# Patient Record
Sex: Male | Born: 1935 | Race: Black or African American | Hispanic: No | Marital: Married | Smoking: Former smoker
Health system: Southern US, Community
[De-identification: ages and names within clinical notes are randomized; demographics above are authoritative.]

## PROBLEM LIST (undated history)

## (undated) DIAGNOSIS — R002 Palpitations: Secondary | ICD-10-CM

## (undated) DIAGNOSIS — I452 Bifascicular block: Secondary | ICD-10-CM

## (undated) DIAGNOSIS — F419 Anxiety disorder, unspecified: Secondary | ICD-10-CM

## (undated) DIAGNOSIS — I480 Paroxysmal atrial fibrillation: Secondary | ICD-10-CM

## (undated) DIAGNOSIS — C250 Malignant neoplasm of head of pancreas: Secondary | ICD-10-CM

## (undated) DIAGNOSIS — I2510 Atherosclerotic heart disease of native coronary artery without angina pectoris: Secondary | ICD-10-CM

## (undated) DIAGNOSIS — M549 Dorsalgia, unspecified: Secondary | ICD-10-CM

## (undated) DIAGNOSIS — Z8619 Personal history of other infectious and parasitic diseases: Secondary | ICD-10-CM

## (undated) DIAGNOSIS — I6529 Occlusion and stenosis of unspecified carotid artery: Secondary | ICD-10-CM

## (undated) DIAGNOSIS — N529 Male erectile dysfunction, unspecified: Secondary | ICD-10-CM

## (undated) DIAGNOSIS — I10 Essential (primary) hypertension: Secondary | ICD-10-CM

## (undated) DIAGNOSIS — Z9289 Personal history of other medical treatment: Secondary | ICD-10-CM

## (undated) DIAGNOSIS — E785 Hyperlipidemia, unspecified: Secondary | ICD-10-CM

## (undated) HISTORY — DX: Essential (primary) hypertension: I10

## (undated) HISTORY — DX: Male erectile dysfunction, unspecified: N52.9

## (undated) HISTORY — DX: Palpitations: R00.2

## (undated) HISTORY — PX: CORONARY ARTERY BYPASS GRAFT: SHX141

## (undated) HISTORY — DX: Hyperlipidemia, unspecified: E78.5

## (undated) HISTORY — DX: Personal history of other infectious and parasitic diseases: Z86.19

## (undated) HISTORY — DX: Atherosclerotic heart disease of native coronary artery without angina pectoris: I25.10

## (undated) HISTORY — DX: Bifascicular block: I45.2

## (undated) HISTORY — DX: Paroxysmal atrial fibrillation: I48.0

## (undated) HISTORY — DX: Personal history of other medical treatment: Z92.89

## (undated) HISTORY — DX: Anxiety disorder, unspecified: F41.9

## (undated) HISTORY — DX: Dorsalgia, unspecified: M54.9

## (undated) HISTORY — PX: CORONARY ANGIOPLASTY WITH STENT PLACEMENT: SHX49

## (undated) HISTORY — DX: Occlusion and stenosis of unspecified carotid artery: I65.29

## (undated) SURGERY — Surgical Case
Anesthesia: *Unknown

---

## 2000-11-20 ENCOUNTER — Emergency Department (HOSPITAL_COMMUNITY): Admission: EM | Admit: 2000-11-20 | Discharge: 2000-11-20 | Payer: Self-pay | Admitting: Emergency Medicine

## 2000-11-20 ENCOUNTER — Encounter: Payer: Self-pay | Admitting: Emergency Medicine

## 2000-12-15 ENCOUNTER — Encounter (INDEPENDENT_AMBULATORY_CARE_PROVIDER_SITE_OTHER): Payer: Self-pay | Admitting: *Deleted

## 2000-12-15 ENCOUNTER — Ambulatory Visit (HOSPITAL_BASED_OUTPATIENT_CLINIC_OR_DEPARTMENT_OTHER): Admission: RE | Admit: 2000-12-15 | Discharge: 2000-12-15 | Payer: Self-pay | Admitting: Surgery

## 2004-09-04 ENCOUNTER — Encounter: Admission: RE | Admit: 2004-09-04 | Discharge: 2004-09-04 | Payer: Self-pay | Admitting: Family Medicine

## 2004-11-18 ENCOUNTER — Encounter: Admission: RE | Admit: 2004-11-18 | Discharge: 2004-12-03 | Payer: Self-pay | Admitting: Neurosurgery

## 2005-05-16 ENCOUNTER — Encounter: Admission: RE | Admit: 2005-05-16 | Discharge: 2005-05-16 | Payer: Self-pay | Admitting: Family Medicine

## 2005-05-19 ENCOUNTER — Encounter: Admission: RE | Admit: 2005-05-19 | Discharge: 2005-05-19 | Payer: Self-pay | Admitting: Family Medicine

## 2005-05-23 ENCOUNTER — Emergency Department (HOSPITAL_COMMUNITY): Admission: EM | Admit: 2005-05-23 | Discharge: 2005-05-24 | Payer: Self-pay | Admitting: Emergency Medicine

## 2005-05-26 ENCOUNTER — Encounter: Admission: RE | Admit: 2005-05-26 | Discharge: 2005-05-26 | Payer: Self-pay | Admitting: Family Medicine

## 2005-05-27 ENCOUNTER — Inpatient Hospital Stay (HOSPITAL_COMMUNITY): Admission: AD | Admit: 2005-05-27 | Discharge: 2005-05-30 | Payer: Self-pay | Admitting: Internal Medicine

## 2008-04-28 ENCOUNTER — Encounter: Admission: RE | Admit: 2008-04-28 | Discharge: 2008-04-28 | Payer: Self-pay | Admitting: Family Medicine

## 2009-05-29 ENCOUNTER — Inpatient Hospital Stay (HOSPITAL_BASED_OUTPATIENT_CLINIC_OR_DEPARTMENT_OTHER): Admission: RE | Admit: 2009-05-29 | Discharge: 2009-05-29 | Payer: Self-pay | Admitting: Cardiovascular Disease

## 2009-06-03 ENCOUNTER — Inpatient Hospital Stay (HOSPITAL_COMMUNITY): Admission: RE | Admit: 2009-06-03 | Discharge: 2009-06-04 | Payer: Self-pay | Admitting: Cardiovascular Disease

## 2009-06-28 ENCOUNTER — Emergency Department (HOSPITAL_COMMUNITY): Admission: EM | Admit: 2009-06-28 | Discharge: 2009-06-28 | Payer: Self-pay | Admitting: Emergency Medicine

## 2009-10-12 ENCOUNTER — Ambulatory Visit: Payer: Self-pay | Admitting: Cardiothoracic Surgery

## 2009-10-12 ENCOUNTER — Inpatient Hospital Stay (HOSPITAL_COMMUNITY): Admission: RE | Admit: 2009-10-12 | Discharge: 2009-10-23 | Payer: Self-pay | Admitting: Cardiovascular Disease

## 2009-10-12 HISTORY — PX: CARDIAC CATHETERIZATION: SHX172

## 2009-10-13 ENCOUNTER — Encounter: Payer: Self-pay | Admitting: Cardiothoracic Surgery

## 2009-11-19 ENCOUNTER — Encounter: Admission: RE | Admit: 2009-11-19 | Discharge: 2009-11-19 | Payer: Self-pay | Admitting: Cardiothoracic Surgery

## 2009-11-19 ENCOUNTER — Encounter (HOSPITAL_COMMUNITY): Admission: RE | Admit: 2009-11-19 | Discharge: 2010-02-05 | Payer: Self-pay | Admitting: Cardiovascular Disease

## 2009-11-19 ENCOUNTER — Ambulatory Visit: Payer: Self-pay | Admitting: Cardiothoracic Surgery

## 2010-02-20 ENCOUNTER — Emergency Department (HOSPITAL_COMMUNITY): Admission: EM | Admit: 2010-02-20 | Discharge: 2010-02-20 | Payer: Self-pay | Admitting: Emergency Medicine

## 2010-05-07 ENCOUNTER — Ambulatory Visit: Payer: Self-pay | Admitting: Cardiology

## 2010-07-21 ENCOUNTER — Ambulatory Visit: Payer: Self-pay | Admitting: Cardiology

## 2010-08-02 ENCOUNTER — Ambulatory Visit: Payer: Self-pay | Admitting: Cardiovascular Disease

## 2010-09-03 ENCOUNTER — Ambulatory Visit: Payer: Self-pay | Admitting: Cardiovascular Disease

## 2010-09-19 ENCOUNTER — Encounter: Payer: Self-pay | Admitting: Cardiothoracic Surgery

## 2010-11-09 ENCOUNTER — Ambulatory Visit (INDEPENDENT_AMBULATORY_CARE_PROVIDER_SITE_OTHER): Payer: Medicare Other | Admitting: Cardiovascular Disease

## 2010-11-09 DIAGNOSIS — E7800 Pure hypercholesterolemia, unspecified: Secondary | ICD-10-CM

## 2010-11-09 DIAGNOSIS — Z951 Presence of aortocoronary bypass graft: Secondary | ICD-10-CM

## 2010-11-09 DIAGNOSIS — I2510 Atherosclerotic heart disease of native coronary artery without angina pectoris: Secondary | ICD-10-CM

## 2010-11-10 ENCOUNTER — Other Ambulatory Visit (INDEPENDENT_AMBULATORY_CARE_PROVIDER_SITE_OTHER): Payer: Medicare Other

## 2010-11-10 DIAGNOSIS — E789 Disorder of lipoprotein metabolism, unspecified: Secondary | ICD-10-CM

## 2010-11-14 LAB — TROPONIN I: Troponin I: 0.01 ng/mL (ref 0.00–0.06)

## 2010-11-14 LAB — URINALYSIS, ROUTINE W REFLEX MICROSCOPIC
Hgb urine dipstick: NEGATIVE
Ketones, ur: NEGATIVE mg/dL
Nitrite: NEGATIVE
Urobilinogen, UA: 1 mg/dL (ref 0.0–1.0)

## 2010-11-14 LAB — CBC
HCT: 37.3 % — ABNORMAL LOW (ref 39.0–52.0)
Hemoglobin: 12.4 g/dL — ABNORMAL LOW (ref 13.0–17.0)
MCH: 28.3 pg (ref 26.0–34.0)
MCV: 85.2 fL (ref 78.0–100.0)
RBC: 4.38 MIL/uL (ref 4.22–5.81)
WBC: 4.3 10*3/uL (ref 4.0–10.5)

## 2010-11-14 LAB — DIFFERENTIAL
Lymphocytes Relative: 29 % (ref 12–46)
Monocytes Absolute: 0.4 10*3/uL (ref 0.1–1.0)
Neutro Abs: 2.6 10*3/uL (ref 1.7–7.7)

## 2010-11-14 LAB — BASIC METABOLIC PANEL
Chloride: 108 mEq/L (ref 96–112)
Glucose, Bld: 111 mg/dL — ABNORMAL HIGH (ref 70–99)

## 2010-11-17 LAB — CBC
HCT: 24.9 % — ABNORMAL LOW (ref 39.0–52.0)
HCT: 24.9 % — ABNORMAL LOW (ref 39.0–52.0)
HCT: 26.8 % — ABNORMAL LOW (ref 39.0–52.0)
HCT: 29.4 % — ABNORMAL LOW (ref 39.0–52.0)
HCT: 32 % — ABNORMAL LOW (ref 39.0–52.0)
HCT: 34.3 % — ABNORMAL LOW (ref 39.0–52.0)
HCT: 34.6 % — ABNORMAL LOW (ref 39.0–52.0)
HCT: 35 % — ABNORMAL LOW (ref 39.0–52.0)
HCT: 37.7 % — ABNORMAL LOW (ref 39.0–52.0)
HCT: 40.3 % (ref 39.0–52.0)
HCT: 42.7 % (ref 39.0–52.0)
Hemoglobin: 11.1 g/dL — ABNORMAL LOW (ref 13.0–17.0)
Hemoglobin: 11.7 g/dL — ABNORMAL LOW (ref 13.0–17.0)
Hemoglobin: 11.9 g/dL — ABNORMAL LOW (ref 13.0–17.0)
Hemoglobin: 12 g/dL — ABNORMAL LOW (ref 13.0–17.0)
Hemoglobin: 12.9 g/dL — ABNORMAL LOW (ref 13.0–17.0)
Hemoglobin: 13.6 g/dL (ref 13.0–17.0)
Hemoglobin: 14.5 g/dL (ref 13.0–17.0)
Hemoglobin: 8.4 g/dL — ABNORMAL LOW (ref 13.0–17.0)
Hemoglobin: 8.5 g/dL — ABNORMAL LOW (ref 13.0–17.0)
Hemoglobin: 9.1 g/dL — ABNORMAL LOW (ref 13.0–17.0)
MCHC: 33.8 g/dL (ref 30.0–36.0)
MCHC: 34.1 g/dL (ref 30.0–36.0)
MCHC: 34.1 g/dL (ref 30.0–36.0)
MCHC: 34.2 g/dL (ref 30.0–36.0)
MCHC: 34.2 g/dL (ref 30.0–36.0)
MCHC: 34.2 g/dL (ref 30.0–36.0)
MCHC: 34.3 g/dL (ref 30.0–36.0)
MCHC: 34.3 g/dL (ref 30.0–36.0)
MCHC: 34.3 g/dL (ref 30.0–36.0)
MCHC: 34.7 g/dL (ref 30.0–36.0)
MCV: 89.4 fL (ref 78.0–100.0)
MCV: 89.5 fL (ref 78.0–100.0)
MCV: 89.9 fL (ref 78.0–100.0)
MCV: 90.5 fL (ref 78.0–100.0)
MCV: 90.7 fL (ref 78.0–100.0)
MCV: 90.9 fL (ref 78.0–100.0)
MCV: 91.5 fL (ref 78.0–100.0)
MCV: 91.5 fL (ref 78.0–100.0)
MCV: 91.6 fL (ref 78.0–100.0)
MCV: 91.9 fL (ref 78.0–100.0)
MCV: 92.1 fL (ref 78.0–100.0)
Platelets: 122 10*3/uL — ABNORMAL LOW (ref 150–400)
Platelets: 186 10*3/uL (ref 150–400)
Platelets: 199 10*3/uL (ref 150–400)
Platelets: 201 10*3/uL (ref 150–400)
Platelets: 252 10*3/uL (ref 150–400)
Platelets: 261 10*3/uL (ref 150–400)
Platelets: 277 10*3/uL (ref 150–400)
Platelets: 291 10*3/uL (ref 150–400)
Platelets: 309 10*3/uL (ref 150–400)
RBC: 2.64 MIL/uL — ABNORMAL LOW (ref 4.22–5.81)
RBC: 2.7 MIL/uL — ABNORMAL LOW (ref 4.22–5.81)
RBC: 2.71 MIL/uL — ABNORMAL LOW (ref 4.22–5.81)
RBC: 2.96 MIL/uL — ABNORMAL LOW (ref 4.22–5.81)
RBC: 3.58 MIL/uL — ABNORMAL LOW (ref 4.22–5.81)
RBC: 3.82 MIL/uL — ABNORMAL LOW (ref 4.22–5.81)
RBC: 3.84 MIL/uL — ABNORMAL LOW (ref 4.22–5.81)
RBC: 3.85 MIL/uL — ABNORMAL LOW (ref 4.22–5.81)
RBC: 3.86 MIL/uL — ABNORMAL LOW (ref 4.22–5.81)
RBC: 4.2 MIL/uL — ABNORMAL LOW (ref 4.22–5.81)
RBC: 4.26 MIL/uL (ref 4.22–5.81)
RBC: 4.65 MIL/uL (ref 4.22–5.81)
RDW: 13.4 % (ref 11.5–15.5)
RDW: 13.5 % (ref 11.5–15.5)
RDW: 13.5 % (ref 11.5–15.5)
RDW: 13.6 % (ref 11.5–15.5)
RDW: 13.9 % (ref 11.5–15.5)
RDW: 14.1 % (ref 11.5–15.5)
RDW: 14.5 % (ref 11.5–15.5)
RDW: 14.7 % (ref 11.5–15.5)
RDW: 14.7 % (ref 11.5–15.5)
RDW: 14.8 % (ref 11.5–15.5)
RDW: 14.9 % (ref 11.5–15.5)
RDW: 15.2 % (ref 11.5–15.5)
WBC: 10.4 10*3/uL (ref 4.0–10.5)
WBC: 10.7 10*3/uL — ABNORMAL HIGH (ref 4.0–10.5)
WBC: 11.5 10*3/uL — ABNORMAL HIGH (ref 4.0–10.5)
WBC: 11.7 10*3/uL — ABNORMAL HIGH (ref 4.0–10.5)
WBC: 12.6 10*3/uL — ABNORMAL HIGH (ref 4.0–10.5)
WBC: 13.9 10*3/uL — ABNORMAL HIGH (ref 4.0–10.5)
WBC: 5.6 10*3/uL (ref 4.0–10.5)
WBC: 5.9 10*3/uL (ref 4.0–10.5)
WBC: 8.3 10*3/uL (ref 4.0–10.5)
WBC: 9.9 10*3/uL (ref 4.0–10.5)

## 2010-11-17 LAB — POCT I-STAT, CHEM 8
Chloride: 108 mEq/L (ref 96–112)
Glucose, Bld: 129 mg/dL — ABNORMAL HIGH (ref 70–99)
HCT: 35 % — ABNORMAL LOW (ref 39.0–52.0)
Hemoglobin: 11.9 g/dL — ABNORMAL LOW (ref 13.0–17.0)
Potassium: 4.1 mEq/L (ref 3.5–5.1)
Sodium: 139 mEq/L (ref 135–145)

## 2010-11-17 LAB — POCT I-STAT 4, (NA,K, GLUC, HGB,HCT)
Glucose, Bld: 97 mg/dL (ref 70–99)
HCT: 20 % — ABNORMAL LOW (ref 39.0–52.0)
HCT: 22 % — ABNORMAL LOW (ref 39.0–52.0)
HCT: 33 % — ABNORMAL LOW (ref 39.0–52.0)
Hemoglobin: 5.8 g/dL — CL (ref 13.0–17.0)
Hemoglobin: 6.1 g/dL — CL (ref 13.0–17.0)
Hemoglobin: 7.5 g/dL — ABNORMAL LOW (ref 13.0–17.0)
Hemoglobin: 8.2 g/dL — ABNORMAL LOW (ref 13.0–17.0)
Sodium: 136 mEq/L (ref 135–145)
Sodium: 137 mEq/L (ref 135–145)
Sodium: 141 mEq/L (ref 135–145)
Sodium: 141 mEq/L (ref 135–145)

## 2010-11-17 LAB — TYPE AND SCREEN: Antibody Screen: NEGATIVE

## 2010-11-17 LAB — GLUCOSE, CAPILLARY
Glucose-Capillary: 104 mg/dL — ABNORMAL HIGH (ref 70–99)
Glucose-Capillary: 105 mg/dL — ABNORMAL HIGH (ref 70–99)
Glucose-Capillary: 114 mg/dL — ABNORMAL HIGH (ref 70–99)
Glucose-Capillary: 120 mg/dL — ABNORMAL HIGH (ref 70–99)
Glucose-Capillary: 135 mg/dL — ABNORMAL HIGH (ref 70–99)
Glucose-Capillary: 95 mg/dL (ref 70–99)
Glucose-Capillary: 97 mg/dL (ref 70–99)
Glucose-Capillary: 99 mg/dL (ref 70–99)

## 2010-11-17 LAB — BASIC METABOLIC PANEL
BUN: 13 mg/dL (ref 6–23)
BUN: 18 mg/dL (ref 6–23)
BUN: 9 mg/dL (ref 6–23)
CO2: 25 mEq/L (ref 19–32)
CO2: 27 mEq/L (ref 19–32)
CO2: 28 mEq/L (ref 19–32)
Calcium: 7.2 mg/dL — ABNORMAL LOW (ref 8.4–10.5)
Calcium: 7.5 mg/dL — ABNORMAL LOW (ref 8.4–10.5)
Calcium: 8.6 mg/dL (ref 8.4–10.5)
Chloride: 106 mEq/L (ref 96–112)
Chloride: 108 mEq/L (ref 96–112)
Creatinine, Ser: 0.92 mg/dL (ref 0.4–1.5)
Creatinine, Ser: 1 mg/dL (ref 0.4–1.5)
GFR calc Af Amer: >60 mL/min (ref >60)
GFR calc Af Amer: >60 mL/min (ref >60)
GFR calc Af Amer: >60 mL/min (ref >60)
GFR calc Af Amer: >60 mL/min (ref >60)
GFR calc non Af Amer: >60 mL/min (ref >60)
GFR calc non Af Amer: >60 mL/min (ref >60)
GFR calc non Af Amer: >60 mL/min (ref >60)
GFR calc non Af Amer: >60 mL/min (ref >60)
Glucose, Bld: 104 mg/dL — ABNORMAL HIGH (ref 70–99)
Glucose, Bld: 117 mg/dL — ABNORMAL HIGH (ref 70–99)
Glucose, Bld: 122 mg/dL — ABNORMAL HIGH (ref 70–99)
Potassium: 3.5 mEq/L (ref 3.5–5.1)
Potassium: 3.8 mEq/L (ref 3.5–5.1)
Potassium: 4 mEq/L (ref 3.5–5.1)
Sodium: 137 mEq/L (ref 135–145)
Sodium: 139 mEq/L (ref 135–145)
Sodium: 139 mEq/L (ref 135–145)
Sodium: 140 mEq/L (ref 135–145)

## 2010-11-17 LAB — CARDIAC PANEL(CRET KIN+CKTOT+MB+TROPI)
CK, MB: 0.6 ng/mL (ref 0.3–4.0)
Relative Index: INVALID (ref 0.0–2.5)
Total CK: 22 U/L (ref 7–232)

## 2010-11-17 LAB — POCT I-STAT 3, ART BLOOD GAS (G3+)
Acid-Base Excess: 1 mmol/L (ref 0.0–2.0)
Acid-base deficit: 2 mmol/L (ref 0.0–2.0)
Acid-base deficit: 3 mmol/L — ABNORMAL HIGH (ref 0.0–2.0)
Bicarbonate: 23.3 mEq/L (ref 20.0–24.0)
Bicarbonate: 26.7 mEq/L — ABNORMAL HIGH (ref 20.0–24.0)
O2 Saturation: 100 %
O2 Saturation: 100 %
O2 Saturation: 98 %
O2 Saturation: 99 %
TCO2: 25 mmol/L (ref 0–100)
pCO2 arterial: 28.1 mmHg — ABNORMAL LOW (ref 35.0–45.0)
pCO2 arterial: 46.2 mmHg — ABNORMAL HIGH (ref 35.0–45.0)
pO2, Arterial: 123 mmHg — ABNORMAL HIGH (ref 80.0–100.0)
pO2, Arterial: 268 mmHg — ABNORMAL HIGH (ref 80.0–100.0)

## 2010-11-17 LAB — POCT I-STAT 3, VENOUS BLOOD GAS (G3P V)
Acid-base deficit: 2 mmol/L (ref 0.0–2.0)
Bicarbonate: 23.6 mEq/L (ref 20.0–24.0)
O2 Saturation: 78 %
TCO2: 25 mmol/L (ref 0–100)
pO2, Ven: 29 mmHg — CL (ref 30.0–45.0)

## 2010-11-17 LAB — PLATELET COUNT: Platelets: 153 10*3/uL (ref 150–400)

## 2010-11-17 LAB — CREATININE, SERUM
Creatinine, Ser: 0.95 mg/dL (ref 0.4–1.5)
GFR calc Af Amer: >60 mL/min (ref >60)
GFR calc non Af Amer: >60 mL/min (ref >60)

## 2010-11-17 LAB — MRSA PCR SCREENING
MRSA by PCR: NEGATIVE
MRSA by PCR: NEGATIVE

## 2010-11-17 LAB — BLOOD GAS, ARTERIAL
Bicarbonate: 23.1 mEq/L (ref 20.0–24.0)
FIO2: 0.21 %
Patient temperature: 98.6
pH, Arterial: 7.477 — ABNORMAL HIGH (ref 7.350–7.450)

## 2010-11-17 LAB — COMPREHENSIVE METABOLIC PANEL
ALT: 10 U/L (ref 0–53)
AST: 15 U/L (ref 0–37)
Albumin: 2.8 g/dL — ABNORMAL LOW (ref 3.5–5.2)
Alkaline Phosphatase: 27 U/L — ABNORMAL LOW (ref 39–117)
Chloride: 109 mEq/L (ref 96–112)
GFR calc non Af Amer: >60 mL/min (ref >60)
Total Bilirubin: 0.3 mg/dL (ref 0.3–1.2)

## 2010-11-17 LAB — MAGNESIUM
Magnesium: 2.4 mg/dL (ref 1.5–2.5)
Magnesium: 2.8 mg/dL — ABNORMAL HIGH (ref 1.5–2.5)

## 2010-11-17 LAB — PREPARE RBC (CROSSMATCH)

## 2010-11-17 LAB — POCT I-STAT GLUCOSE
Glucose, Bld: 105 mg/dL — ABNORMAL HIGH (ref 70–99)
Operator id: 3390

## 2010-11-17 LAB — TSH: TSH: 0.269 u[IU]/mL — ABNORMAL LOW (ref 0.350–4.500)

## 2010-11-17 LAB — PROTIME-INR
INR: 1.21 (ref 0.00–1.49)
INR: 1.64 — ABNORMAL HIGH (ref 0.00–1.49)
Prothrombin Time: 19.3 seconds — ABNORMAL HIGH (ref 11.6–15.2)

## 2010-11-17 LAB — HEMOGLOBIN A1C
Hgb A1c MFr Bld: 5.5 % (ref 4.6–6.1)
Mean Plasma Glucose: 111 mg/dL

## 2010-11-17 LAB — HEMOGLOBIN AND HEMATOCRIT, BLOOD: Hemoglobin: 6.8 g/dL — CL (ref 13.0–17.0)

## 2010-11-17 LAB — APTT: aPTT: 32 seconds (ref 24–37)

## 2010-11-18 ENCOUNTER — Telehealth: Payer: Self-pay | Admitting: *Deleted

## 2010-11-18 NOTE — Telephone Encounter (Signed)
Pt called questioning whether he should be on Tricor daily along with his Crestor.  Per Dr. Elease Hashimoto:  No Tricor is needed, continue the Crestor 10mg  daily.  Pt was advised and verbalized an understanding of this.

## 2010-12-02 LAB — BASIC METABOLIC PANEL
BUN: 12 mg/dL (ref 6–23)
CO2: 27 mEq/L (ref 19–32)
Chloride: 106 mEq/L (ref 96–112)
Creatinine, Ser: 0.93 mg/dL (ref 0.4–1.5)
Glucose, Bld: 108 mg/dL — ABNORMAL HIGH (ref 70–99)
Potassium: 3.4 mEq/L — ABNORMAL LOW (ref 3.5–5.1)

## 2010-12-02 LAB — POCT I-STAT, CHEM 8
Calcium, Ion: 1.24 mmol/L (ref 1.12–1.32)
Chloride: 106 mEq/L (ref 96–112)
Creatinine, Ser: 1.1 mg/dL (ref 0.4–1.5)
Glucose, Bld: 97 mg/dL (ref 70–99)
Potassium: 4.1 mEq/L (ref 3.5–5.1)

## 2010-12-02 LAB — CBC
HCT: 39.9 % (ref 39.0–52.0)
MCHC: 34 g/dL (ref 30.0–36.0)
MCV: 93.2 fL (ref 78.0–100.0)
Platelets: 222 10*3/uL (ref 150–400)
RDW: 13.9 % (ref 11.5–15.5)

## 2010-12-23 ENCOUNTER — Other Ambulatory Visit: Payer: Self-pay | Admitting: Cardiovascular Disease

## 2010-12-23 NOTE — Telephone Encounter (Signed)
Pt called, samples of crestor 10mg  at desk [phone] 01/15 30daysJodette Jozee Hammer RN

## 2010-12-23 NOTE — Telephone Encounter (Signed)
Wanted to know if he would be able to get some samples of Crestor from the office. Please call back. I have pulled the chart.

## 2011-01-11 NOTE — Assessment & Plan Note (Signed)
OFFICE VISIT   Alexander Farley, Alexander Farley  DOB:  23-May-1933                                        November 19, 2009  CHART #:  [id number]   HISTORY OF PRESENT ILLNESS:  The patient is status post coronary artery  bypass grafting x2 done by Dr. Tyrone Sage, October 19, 2009.  Preoperatively, the patient did have anemia with guaiac-positive stool  with actively bleeding duodenal arterial venous malformation.  He had  undergone an EGD preoperatively with controlled hemorrhage October 17, 2009.  He proceeded to undergo coronary artery bypass grafting on the  21st and did well postoperatively.  He had no further signs of any GI  bleeding postoperatively.  His hemoglobin and hematocrit remained  stable.  The patient was discharged to home in stable condition.  He  presents today for his 3-week followup visit.  The patient has seen Dr.  Elease Hashimoto since discharge, who felt that the patient was doing well.  He  plans to continue all current medications except he decrease the  amiodarone to 200 mg daily and then the patient is to stop taking once  runs out of his current prescription.  The patient also has gone to  outpatient cardiac rehab appointment.  He went this morning and has  scheduled to continue with outpatient cardiac rehab.  He is currently  ambulating about 1.5 miles daily at home.  He denies any shortness of  breath, chest pain, nausea, vomiting, lightheadedness, or dizziness with  this ambulation.  The patient does complain an episode yesterday of  chest pain following a phone conversation regarding personal issues.  He  states that he developed some numbness in his hands and took a  nitroglycerin at this time.  Following taking the nitroglycerin, the  chest pain did really resolve.  He states that lasted less in 5 minutes.  Other than this episode, the patient has had no other episodes of chest  pain.  He is tolerating diet well.  No nausea or vomiting.  Denies any  fevers, opening or drainage from any of his incision sites.  He does  note that he is not sleeping throughout the night.  When he wakes up, he  just finds a difficult to fall back asleep.   PHYSICAL EXAMINATION:  Vital Signs:  Blood pressure 178/102, pulse of  65, respirations of 18, and O2 saturations 97% on room air.  Respiratory:  Clear to auscultation bilaterally.  Cardiac:  Regular rate  and rhythm.  No murmurs noted.  Noted positive click in the sternum with  cough.  Abdomen:  Bowel sounds x4.  Soft and nontender.  Extremities:  No edema noted.  Incisions:  All incisions are clean, dry, and intact  and healing well.  One suture protruding through his right EVH site was  cut.   STUDIES:  The patient had a PA and lateral chest x-ray done today shows  some minimal bilateral pleural effusions.  There is improved aeration  with stable postoperative changes.  All sternal wires noted to be  intact.   IMPRESSION AND PLAN:  The patient was seen and evaluated by Dr.  Tyrone Sage.  The patient is noted to be progressing quite well.  Blood  pressure noted to be elevated.  On questioning, the patient had not  taken his medications yet.  He states he  forgot.  Plan is to continue  with amiodarone per Dr. Elease Hashimoto as well as Toprol-XL.  I did give him  prescription for lisinopril 2.5 mg to be taken daily.  Cardiac rehab  will be following the patient and checking his blood pressure.  The  patient is to continue all other medications and is recommended he take  the oxycodone at night to assist with his sleeping habits.  He is to  continue ambulating 3 or 4 times per day and using his incentive  spirometer.  It is okay for him to start driving at this point.  He is  instructed still no heavy lifting for another 2 months.  At this point,  Dr. Tyrone Sage feels the patient is progressing quite well and can be  released from the office.  He will plan to continue following up with  Dr. Elease Hashimoto.  The patient  is instructed if he has any surgical issues, he  is to contact us.  The patient is in agreement.   Sheliah Plane, MD  Electronically Signed   KMD/MEDQ  D:  11/19/2009  T:  11/20/2009  Job:  [id number]   cc:   Vesta Mixer, M.D.

## 2011-01-14 NOTE — Discharge Summary (Signed)
NAMEJAMORI, Alexander Farley                 ACCOUNT NO.:  [id number]   MEDICAL RECORD NO.:  [id number]          PATIENT TYPE:  INP   LOCATION:  5501                         FACILITY:  [facility]   PHYSICIAN:  [name], MDDATE OF BIRTH:  1933-01-07   DATE OF ADMISSION:  05/27/2005  DATE OF DISCHARGE:  05/30/2005                                 DISCHARGE SUMMARY   DIAGNOSES:  1.  Hypotension secondary to dehydration and medications.  2.  Dehydration.  3.  Acute renal insufficiency secondary to prerenal azotemia.  4.  Digoxin toxicity.  5.  Fatigue, weakness, failure to thrive most likely secondary to Zocor.  6.  Lumbar spinal stenosis.  7.  History of hypertension.  8.  History of atrial fibrillation, maintained in sinus rhythm for many      years according to Dr. Manus Gunning.  9.  Anxiety disorder.  10. Hyperlipidemia.  11. Benign prostatic hypertrophy  12. Carpal tunnel syndrome.   DISCHARGE MEDICATIONS:  1.  He is to hold Diovan, Zocor, triamterene/hydrochlorothiazide, and      diclofenac.  2.  He is to resume aspirin 325 milligrams a day.  3.  Flomax 0.4 milligrams a day.  4.  Lorazepam 1 milligram p.o. q.h.s.  5.  Tiazac has been decreased to 120 milligrams a day and Lanoxin down to      0.125 milligrams a day.   FOLLOWUP:  He is to follow up with Dr. Manus Gunning within 1-2 weeks. He is to  follow up with Dr. Phoebe Perch for his spinal stenosis which is presumably a new  diagnosis for him.   CONDITION:  Stable.   ACTIVITY:  Ad lib.   DIET:  Diet should be low cholesterol.   CONSULTATIONS:  None.   PROCEDURES:  None.   PERTINENT LABORATORY DATA:  CBC on admission was unremarkable. Hemoccult of  the stool was negative. Erythrocyte sedimentation rate was 28. Complete  metabolic panel on admission was significant for a BUN of 35, a creatinine  of 2.4, albumin 3.0, AST 48, ALT 86, otherwise unremarkable. At discharge,  his BUN was 14 and creatinine 1.1. Digoxin level on admission  was 2.2, at  discharge was 0.8, total CPK was 24.   SPECIAL STUDIES IN RADIOLOGY:  EKG showed normal sinus rhythm and a left  anterior fascicular block. No significant change from previous. MRI of the  lumbar spine showed multifactorial spinal stenosis at L4-5 where 7 mm of  degenerative slip, central bulging, annular fibers, and marked posterior  element hypertrophy contribute to cause compression of not only the thecal  sac but bilateral L4 and bilateral L5 nerve root compression with left  paracentral protrusion at T12-L1 with suspected left L1 nerve root  encroachment.   HISTORY AND HOSPITAL COURSE:  Alexander Farley is a delightful 77 year old black  male patient of Dr. Manus Gunning who was directly admitted to the telemetry unit  with orthostatic hypotension and digoxin toxicity as well as he acute renal  insufficiency. He has a fairly complex history over the past month or so. He  has been feeling weak with poor appetite,  subjective fevers, fatigue, as  well as worsening lower back pain that radiates to both flanks. He has had  several trips to the emergency room and has had multiple tests as an  outpatient. Please see H&P for complete details. On the day of admission, he  was found to have orthostatic hypotension with a blood pressure 72/40  standing. This was taken in Dr. Randel Books office. The patient's medications  were held. In the office, he was also found to have a digoxin level of 3.5.  He had had normal CPK, normal erythrocyte sedimentation rate, and normal TSH  as an outpatient. His baseline creatinine was about 1, and his creatinine in  the office was 2.7. The patient was given IV fluids his medications were  held. He had no ectopy whatsoever on telemetry throughout his  hospitalization. His blood pressure improved and he no longer had  orthostatic hypotension on the day of discharge. He was feeling much better  and eating 100% of his a meals. He had no fevers during his  hospitalization.  His digoxin level came down to within therapeutic range. Also, his  creatinine improved to baseline. Due to the worsening back pain over the  past two weeks, an MRI was ordered and L-spine had been done as an  outpatient. Please see H&P for details. The MRI did show spinal stenosis. He  has seen Dr. Phoebe Perch in the past for what sounds like degenerative disk  disease and osteoarthritis of the C-spine. Due to his acute renal  insufficiency, I have asked that he hold the diclofenac and instead take  Tylenol. He may resume his aspirin a day. However, due to the low blood  pressure, I have asked that he continue to hold his Diovan, triamterene, and  hydrochlorothiazide also due to the renal insufficiency. On the day of  discharge, his blood pressure was about 120 systolic. He may be able to  resume these antihypertensives as an outpatient but I have would do so  carefully. As his blood pressure is still low normal, I will not give Tiazac  420 milligrams a day but have decreased the dose and given him a two week  supply. He may need to increase back to his maintenance dose as an  outpatient. I have discussed the case with Dr. Manus Gunning who agrees with  management and plans on re-evaluating his blood pressure and symptoms to see  whether any of his outpatient medications will be resumed or whether they  will be changed. I have asked that the patient follow up with both Dr.  Phoebe Perch and Dr. Manus Gunning. Total time on the day of discharge is 45 minutes.      [name], MD  Electronically Signed     CLS/MEDQ  D:  05/30/2005  T:  05/30/2005  Job:  [id number]   cc:   Clydene Fake, M.D.  Fax: [phone]   Bryan Lemma. Manus Gunning, M.D.  Fax: [phone]

## 2011-01-14 NOTE — H&P (Signed)
Alexander Farley, Alexander Farley                 ACCOUNT NO.:  [id number]   MEDICAL RECORD NO.:  [id number]          PATIENT TYPE:  INP   LOCATION:  5501                         FACILITY:  [facility]   PHYSICIAN:  [name], MDDATE OF BIRTH:  10/04/1932   DATE OF ADMISSION:  05/27/2005  DATE OF DISCHARGE:                                HISTORY & PHYSICAL   CHIEF COMPLAINT:  Weakness and fever.   HISTORY OF PRESENT ILLNESS:  Alexander Farley is a 77 year old black male patient  of Dr. Manus Gunning who was directly admitted to the telemetry unit with  orthostatic hypotension in the office. His blood pressure went from 98/60 to  lying to 72/40 standing. He reports being weak for the past several weeks.  He thinks it may have started after he was started on Zocor. His appetite  has been poor. He has not eaten or drank much over the past week. He has  lost weight, about 10 pounds recently. His wife reports that he felt hot and  apparently was delirious several times over the past week. She never took  his temperature. He also fell last night when he stood up.  He denies cough,  dysuria, shaking chills, or nightsweats. He denies any rash. He denies that  his back has been hurting a lot worse over the past two weeks as well and he  has been taking a lot of diclofenac. He had labs drawn in the office and was  found to have an elevated creatinine and digoxin level. He has had on  shortness of breath, chest pain, nausea, vomiting, or diarrhea.   PAST MEDICAL HISTORY:  1.  History of atrial fibrillation.  2.  Anxiety.  3.  Hyperlipidemia.  4.  Erectile dysfunction.   MEDICATIONS:  1.  Lanoxin 0.25 mg daily.  2.  Aspirin 325 mg daily.  3.  Ativan 1 mg p.o. daily.  4.  Maxzide 75 mg daily.  5.  Tiazac 428 mg daily.  6.  Multivitamin a day.  7.  Diovan 160 mg daily.  8.  Zocor 40 mg  a day was started in August.  9.  Viagra as needed.  10. Diclofenac b.i.d.   ALLERGIES:  No known drug allergies.   SOCIAL HISTORY:  He is married. He does not drink or smoke.   FAMILY HISTORY:  His mother and father both died in their 10s of stroke.   REVIEW OF SYSTEMS:  As above. Otherwise, negative.   PHYSICAL EXAMINATION:  VITAL SIGNS:  His temperature here is 98.2, pulse 82,  respiratory rate 20, blood pressure 84/52 and upon recheck five minutes  later was 94/51, oxygen saturation 97% on room air. He weighed 69 kg.  GENERAL: The patient is a thin appearing, weak appearing black male.  HEENT: Normocephalic and atraumatic. Pupils equal, round, and reactive to  light. He has muddy sclerae. Moist mucous membranes.  NECK: Supple with no carotid bruits, no thyromegaly, no lymphadenopathy.  LUNGS: Clear to auscultation bilaterally without rales, rhonchi, or wheezes.  CARDIOVASCULAR: Regular rate and rhythm without murmurs, rubs, or gallops.  ABDOMEN:  Normal bowel sounds, soft, nontender, and nondistended.  GU/RECTAL EXAM: Deferred.  EXTREMITIES: No clubbing, cyanosis, or edema. Pulses are intact.  SKIN: No rash.  PSYCHIATRIC: The patient has a very flat affect and quiet voice, but is calm  and cooperative.  NEUROLOGIC: The patient is alert and oriented. Cranial nerve and  sensorimotor exam are intact.   LABORATORY DATA:  Labs drawn today in the office his digoxin level was 3.5,  CPK 18, glucose 154, BUN 33, creatinine 2.7. White blood cell count 8,  hemoglobin 12.9, hematocrit 37.3, platelet count 261,000. Sodium 137,  potassium 4.0, chloride 106, bicarbonate 25, calcium 9. His creatinine drawn  on May 10, 2005, was 1.2 and his liver function tests were normal at  that time. His TSH on May 10, 2005, was 1.04. Erythrocyte sediment  rate on that day was 10.  He went to the emergency room on May 23, 2005, and had labs drawn. The CBC was within normal limits. His BUN was 26  and his creatinine was 1.6 at that time. He had a UA that was negative for  nitrites, leukocyte esterase, and  blood at that time. Strep screen was  negative. He had an ultrasound of the abdomen on May 16, 2005, which  showed no gallstones and a soft tissue mass, suggest CT of the pancreas;  complex right renal cyst. CT of the abdomen with and without contrast showed  lobulation at the anterior pancreatic neck without any intrinsic pancreatic  lesion seen. Multiple liver cysts. Benign-appearing right renal cyst.  Incidental small hiatal hernia. Degenerative changes in the lumbar spine  with slight scoliosis and L5-S1 anterolisthesis. Chest x-ray done on the  25th showed nothing acute. L-spine films on the 28th showed grade I  anterolisthesis of L4 on L5 and degenerative disease of the lumbar spine  without acute abnormalities. EKG showed normal sinus rhythm, left anterior  fascicular block.   ASSESSMENT/PLAN:  1.  Digoxin toxicity. Patient will be monitored on telemetry. He will get IV      fluids and supportive care. His digoxin, of course, will be held. At      this time he does not need Digibind.  2.  Acute renal insufficiency, multifactorial, certainly related to      dehydration, Diovan, and Diclofenac. He will get IV fluids and have      these medications held.  3.  Hypotension secondary to dehydration and medication effect. All of his      antihypertensives and heart medications will be held.  4.  History of weakness. I question whether this may be related to the      Statin and I will stop this and see if his fatigue improves after      resolution of his dehydration, etc.  5.  Weight loss. Maybe due to digoxin toxicity. I will, however, get      Hemoccults of the stool and give empiric  proton pump inhibitor. He      reports that he has never had a colonoscopy.  6.  Worsening back pain: He describes the pain as a pain that goes across      his back. He has no point tenderness on back examination. I will get an     MRI. He describes no bowel or bladder incontinence.  7.  History of  hypertension.  8.  History of atrial fibrillation.  9.  Anxiety disorder.  10. Hyperlipidemia.  11. Benign prostatic hypertrophy, recently started on Flomax.  12. Reported fever. Blood cultures were drawn in the emergency room on the      25th and are negative to date as have been all the other workups. For      now this will just be monitored and I will check an erythrocyte      sedimentation rate.      [name], MD  Electronically Signed     CLS/MEDQ  D:  05/27/2005  T:  05/27/2005  Job:  [id number]

## 2011-01-14 NOTE — Op Note (Signed)
[place]  Patient:    XAVIEN, DAUPHINAIS                        MRN: [id number] Proc. Date: 12/15/00 Adm. Date:  [id number] Attending:  Charlton Haws CC:         Dyanne Carrel, M.D.   Operative Report  ACCOUNT NO. [id number]. CCS [id number].  PREOPERATIVE DIAGNOSIS:  Indirect right inguinal hernia.  POSTOPERATIVE DIAGNOSIS:  Indirect right inguinal hernia.  PROCEDURE:  Repair indirect right inguinal hernia with mesh.  SURGEON:  Currie Paris, M.D.  ANESTHESIA:  MAC.  CLINICAL HISTORY:  This patient is a 77 year old with a right inguinal hernia, which is extending down toward the testis.  It is thought to be a large indirect would reduce.  DESCRIPTION OF PROCEDURE:  The patient brought to the operating room after having had the operative site marked in the holding area.  He was then given IV sedation, and the area was prepped and draped.  I used a combination of 1% Xylocaine with epinephrine and 0.5% Marcaine mixed equally.  I infiltrated along the skin line and then above the anterior superior iliac spine.  The incision was made and deepened to the external oblique aponeurosis with bleeders coagulated or tied with 4-0 Vicryl.  Additional local was infiltrated as needed.  The external oblique aponeurosis was opened in the line of its fibers.  The cord was dissected up off the inguinal floor and was somewhat adherent because of some chronic inflammatory changes from his chronic hernia. Once this was done, the floor was a little thinned out but appeared intact. There was a large indirect sac present.  I had difficulty initially peeling the sac off of the cord, so I opened it so I could put my finger in it and used that for a little traction and using a moist 4 x 4 was able to peel the sac over off of the cord down to the deep ring.  It was fairly narrow at the deep ring, as at the time of surgery there was nothing protruding  through it. It was twisted and then ligated with 2-0 silk followed by a tie and then amputated.  It retracted nearly into the deep ring.  The deep ring was slightly dilated but not enough that I felt we needed to put a plug in.  A 3 x 6 inch piece of Marlex mesh was used, cut as an oval at one end and squared off at the other end and a little bit narrower to fit, and then split laterally so that it would go around the cord.  This was sutured in with a running 2-0 Prolene inferiorly and then tacked medially to the internal oblique.  The tails were split so they went well beyond the deep ring and recreated a new deep ring, which appeared to have adequate room for the cord structures to come through.  This appeared to lay nicely.  We checked for hemostasis, and everything appeared to be dry.  The wound was closed with a running 3-0 Vicryl on the external oblique, running 3-0 Vicryl on Scarpas, and 4-0 Monocryl subcuticular plus Steri-Strips.  The patient tolerated the procedure well.  There were no operative complications.  All counts were correct. DD:  12/15/00 TD:  12/16/00 Job: [id number]

## 2011-01-31 ENCOUNTER — Encounter: Payer: Self-pay | Admitting: Cardiovascular Disease

## 2011-01-31 ENCOUNTER — Telehealth: Payer: Self-pay | Admitting: Cardiovascular Disease

## 2011-01-31 NOTE — Telephone Encounter (Signed)
spoke with pt, no chest pain but has a more pronounced and noticeable beat for 2 weeks, also has a numbness/tingle feeling that will occasionally come over his body. Denies sob at rest, n/v, and diaphoresis. 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 Has a little more sob with exertion. Pt told to get BP/pulse and I will call him back in 10 min, pt agreed to do. Called back BP 108/69, 126/83, 118/81 and pulse 56-52. Decline earlier time app made in afternoon so he can attend his grand daughters graduation. Alfonso Ramus RN

## 2011-01-31 NOTE — Telephone Encounter (Signed)
Pt called saying he is having palpatations but no pain he wants to talk to a nurse please call

## 2011-02-01 ENCOUNTER — Encounter: Payer: Self-pay | Admitting: Cardiovascular Disease

## 2011-02-01 ENCOUNTER — Emergency Department (HOSPITAL_COMMUNITY): Payer: Medicare Other

## 2011-02-01 ENCOUNTER — Emergency Department (HOSPITAL_COMMUNITY)
Admission: EM | Admit: 2011-02-01 | Discharge: 2011-02-01 | Disposition: A | Discharge status: Home / Self Care | Payer: Medicare Other | Attending: Emergency Medicine | Admitting: Emergency Medicine

## 2011-02-01 ENCOUNTER — Ambulatory Visit (INDEPENDENT_AMBULATORY_CARE_PROVIDER_SITE_OTHER): Payer: Medicare Other | Admitting: Cardiovascular Disease

## 2011-02-01 DIAGNOSIS — R42 Dizziness and giddiness: Secondary | ICD-10-CM | POA: Insufficient documentation

## 2011-02-01 DIAGNOSIS — R200 Anesthesia of skin: Secondary | ICD-10-CM | POA: Insufficient documentation

## 2011-02-01 DIAGNOSIS — I2510 Atherosclerotic heart disease of native coronary artery without angina pectoris: Secondary | ICD-10-CM

## 2011-02-01 DIAGNOSIS — R209 Unspecified disturbances of skin sensation: Secondary | ICD-10-CM | POA: Insufficient documentation

## 2011-02-01 DIAGNOSIS — Z79899 Other long term (current) drug therapy: Secondary | ICD-10-CM | POA: Insufficient documentation

## 2011-02-01 DIAGNOSIS — E785 Hyperlipidemia, unspecified: Secondary | ICD-10-CM | POA: Insufficient documentation

## 2011-02-01 DIAGNOSIS — Z7982 Long term (current) use of aspirin: Secondary | ICD-10-CM | POA: Insufficient documentation

## 2011-02-01 LAB — URINALYSIS, ROUTINE W REFLEX MICROSCOPIC
Bilirubin Urine: NEGATIVE
Hgb urine dipstick: NEGATIVE
Protein, ur: NEGATIVE mg/dL
Urobilinogen, UA: 1 mg/dL (ref 0.0–1.0)

## 2011-02-01 LAB — COMPREHENSIVE METABOLIC PANEL
AST: 23 U/L (ref 0–37)
Albumin: 3.5 g/dL (ref 3.5–5.2)
Alkaline Phosphatase: 54 U/L (ref 39–117)
BUN: 18 mg/dL (ref 6–23)
CO2: 25 mEq/L (ref 19–32)
Chloride: 104 mEq/L (ref 96–112)
GFR calc Af Amer: >60 mL/min (ref >60)
Potassium: 3.8 mEq/L (ref 3.5–5.1)
Total Bilirubin: 0.5 mg/dL (ref 0.3–1.2)

## 2011-02-01 LAB — DIFFERENTIAL
Basophils Relative: 1 % (ref 0–1)
Eosinophils Absolute: 0.2 10*3/uL (ref 0.0–0.7)
Eosinophils Relative: 4 % (ref 0–5)
Lymphs Abs: 2.4 10*3/uL (ref 0.7–4.0)
Neutrophils Relative %: 42 % — ABNORMAL LOW (ref 43–77)

## 2011-02-01 LAB — CBC
MCV: 87.8 fL (ref 78.0–100.0)
Platelets: 220 10*3/uL (ref 150–400)
RDW: 14.8 % (ref 11.5–15.5)
WBC: 5.2 10*3/uL (ref 4.0–10.5)

## 2011-02-01 LAB — PROTIME-INR: Prothrombin Time: 13.2 seconds (ref 11.6–15.2)

## 2011-02-01 LAB — APTT: aPTT: 28 seconds (ref 24–37)

## 2011-02-01 LAB — TROPONIN I: Troponin I: <0.3 ng/mL (ref <0.30)

## 2011-02-01 MED ORDER — CARVEDILOL 12.5 MG PO TABS: 12.5000 mg | ORAL_TABLET | Freq: Two times a day (BID) | ORAL | Status: DC

## 2011-02-01 NOTE — Assessment & Plan Note (Signed)
Symptoms are consistent with orthostasis.  Will decrease the coreg to 1/2 the present dose.

## 2011-02-01 NOTE — Progress Notes (Signed)
Alexander Farley Date of Birth  11/03/1932 [facility] / [facility] [address]     [address] [place], [place]  [address] [phone]  Fax  [phone]  History of Present Illness:  Awoke this am at 5 AM with bilateral arm and hand numbness.  No angina. No dyspnea.  No symptoms similar to his presenting symptoms before his stent placement.  Walks without dyspnea or chest pain.  Numbness is better.   Last Alexander Farley had an episode of lightneadedness while coming out of the utility room. Has symptoms of orthostasis.   Current Outpatient Prescriptions on File Prior to Visit  Medication Sig Dispense Refill  . aspirin 81 MG tablet Take 81 mg by mouth daily.        . carvedilol (COREG) 25 MG tablet Take 25 mg by mouth 2 (two) times daily with a meal.        . folic acid (FOLVITE) 1 MG tablet Take 1 mg by mouth daily.        . hydrochlorothiazide 25 MG tablet Take 25 mg by mouth daily.        Marland Kitchen lisinopril (PRINIVIL,ZESTRIL) 5 MG tablet Take 5 mg by mouth 2 (two) times daily.        Marland Kitchen LORazepam (ATIVAN) 1 MG tablet Take 1 mg by mouth 2 (two) times daily.        . Multiple Vitamin (MULTIVITAMIN) capsule Take 1 capsule by mouth daily.        . Naproxen Sodium (ALEVE PO) Take by mouth as needed.        . nitroGLYCERIN (NITROSTAT) 0.4 MG SL tablet Place 0.4 mg under the tongue every 5 (five) minutes as needed.        . potassium chloride (K-DUR) 10 MEQ tablet Take 10 mEq by mouth daily.        . rosuvastatin (CRESTOR) 10 MG tablet Take 10 mg by mouth daily.        . Tamsulosin HCl (FLOMAX) 0.4 MG CAPS Take 0.4 mg by mouth daily.        . vitamin B-12 (CYANOCOBALAMIN) 50 MCG tablet Take 50 mcg by mouth daily.          Allergies  Allergen Reactions  . Zocor (Simvastatin)     Past Medical History  Diagnosis Date  . Coronary artery disease   . Intermittent atrial fibrillation   . Dyslipidemia   . Hypertension   . Anxiety   . ED (erectile dysfunction)   .  RBBB plus LA hemiblock   . Palpitations   . Back pain     Past Surgical History  Procedure Date  . Cardiac catheterization 10/12/2009    NORMAL LEFT VENTRICULAR SYSTOLIC FUNCTION. EF 60-65%  . Coronary angioplasty with stent placement   . Coronary artery bypass graft     History  Smoking status  . Former Smoker  . Quit date: 01/30/1985  Smokeless tobacco  . Not on file    History  Alcohol Use No    Family History  Problem Relation Age of Onset  . Stroke Mother   . Stroke Father     Reviw of Systems:  Reviewed in the HPI.  All other systems are negative.  Physical Exam: BP 112/78  Pulse 50  Ht 5\' 9"  (1.753 m)  Wt 162 lb 12.8 oz (73.846 kg)  BMI 24.04 kg/m2 The patient is alert and oriented x 3.  The mood and affect are normal.  The  skin is warm and dry.  Color is normal.  The HEENT exam reveals that the sclera are nonicteric.  The mucous membranes are moist.  The carotids are 2+ without bruits.  There is no thyromegaly.  There is no JVD.  The lungs are clear.  The chest wall is non tender.  The heart exam reveals a regular rate with a normal S1 and S2.  There are no murmurs, gallops, or rubs.  The PMI is not displaced.   Abdominal exam reveals good bowel sounds.  There is no guarding or rebound.  There is no hepatosplenomegaly or tenderness.  There are no masses.  Exam of the legs reveal no clubbing, cyanosis, or edema.  The legs are without rashes.  The distal pulses are intact.  Cranial nerves II - XII are intact.  Motor and sensory functions are intact.  The gait is normal.  ECG: Sinus bradycardia. He has a right bundle branch block. EKG is unchanged from previous tracings except rate is a bit slower. Assessment / Plan:

## 2011-02-01 NOTE — Assessment & Plan Note (Signed)
This does not sound cardiac to me.  I suspect that he has some neck issues.  He will need to see Dr. Manus Gunning.  He may need a neck MRI to look for bulging discs.

## 2011-02-02 ENCOUNTER — Encounter: Payer: Self-pay | Admitting: Cardiovascular Disease

## 2011-02-02 DIAGNOSIS — I2510 Atherosclerotic heart disease of native coronary artery without angina pectoris: Secondary | ICD-10-CM | POA: Insufficient documentation

## 2011-02-02 NOTE — Assessment & Plan Note (Signed)
Symptoms do not sound like anginal symptoms. He has not had any the shortness breath or chest discomfort that he had previously. We'll continue with his current medications.

## 2011-02-03 ENCOUNTER — Other Ambulatory Visit: Payer: Self-pay | Admitting: Cardiology

## 2011-02-03 MED ORDER — LISINOPRIL 5 MG PO TABS: 5.0000 mg | ORAL_TABLET | Freq: Two times a day (BID) | ORAL | Status: DC

## 2011-02-03 NOTE — Telephone Encounter (Signed)
Med refill

## 2011-02-07 HISTORY — PX: EYE SURGERY: SHX253

## 2011-03-09 ENCOUNTER — Telehealth: Payer: Self-pay | Admitting: Cardiovascular Disease

## 2011-03-09 NOTE — Telephone Encounter (Signed)
One month of crestor 10mg  samples provided, pt called to pick up.

## 2011-03-09 NOTE — Telephone Encounter (Signed)
Pt wants samples of crestor please call

## 2011-04-21 ENCOUNTER — Other Ambulatory Visit: Payer: Self-pay | Admitting: *Deleted

## 2011-04-21 MED ORDER — CARVEDILOL 12.5 MG PO TABS: 12.5000 mg | ORAL_TABLET | Freq: Two times a day (BID) | ORAL | Status: DC

## 2011-04-21 NOTE — Telephone Encounter (Signed)
Pt called to inform him i ordered med in correct dose and not to cut in half Pt verbalized understanding. Alfonso Ramus RN .

## 2011-05-03 ENCOUNTER — Encounter: Payer: Self-pay | Admitting: Cardiovascular Disease

## 2011-05-03 ENCOUNTER — Ambulatory Visit (INDEPENDENT_AMBULATORY_CARE_PROVIDER_SITE_OTHER): Payer: Medicare Other | Admitting: Cardiovascular Disease

## 2011-05-03 DIAGNOSIS — I10 Essential (primary) hypertension: Secondary | ICD-10-CM

## 2011-05-03 DIAGNOSIS — I2510 Atherosclerotic heart disease of native coronary artery without angina pectoris: Secondary | ICD-10-CM

## 2011-05-03 NOTE — Assessment & Plan Note (Signed)
Alexander Farley is doing very well. He has not had any episodes of chest pain or shortness breath. We'll continue with the same medications.

## 2011-05-03 NOTE — Progress Notes (Signed)
Alexander Farley Date of Birth  1932-11-15 [facility] / [facility] [address]     [address] [place], [place]  [address] [phone]  Fax  [phone]  History of Present Illness:  Alexander Farley is a 77 year old gentleman with a history of coronary artery disease. He has done fairly well. He had some episodes of arm tingling and numbness in June.  He's not had any recurrent episodes. He's not been to see a neurologist.  He's been keeping up with his blood pressure readings at home. His blood pressure readings are all in the normal range.  Current Outpatient Prescriptions on File Prior to Visit  Medication Sig Dispense Refill  . aspirin 81 MG tablet Take 81 mg by mouth daily.        . carvedilol (COREG) 12.5 MG tablet Take 1 tablet (12.5 mg total) by mouth 2 (two) times daily with a meal.  60 tablet  5  . folic acid (FOLVITE) 1 MG tablet Take 1 mg by mouth daily.        . hydrochlorothiazide 25 MG tablet Take 25 mg by mouth daily.        Marland Kitchen lisinopril (PRINIVIL,ZESTRIL) 5 MG tablet Take 1 tablet (5 mg total) by mouth 2 (two) times daily.  60 tablet  5  . LORazepam (ATIVAN) 1 MG tablet Take 1 mg by mouth 2 (two) times daily.        . Multiple Vitamin (MULTIVITAMIN) capsule Take 1 capsule by mouth daily.        . Naproxen Sodium (ALEVE PO) Take 200 mg by mouth as needed.       . nitroGLYCERIN (NITROSTAT) 0.4 MG SL tablet Place 0.4 mg under the tongue every 5 (five) minutes as needed.        . potassium chloride (K-DUR) 10 MEQ tablet Take 10 mEq by mouth daily.        . rosuvastatin (CRESTOR) 10 MG tablet Take 10 mg by mouth daily.        . Tamsulosin HCl (FLOMAX) 0.4 MG CAPS Take 0.4 mg by mouth daily.        . vitamin B-12 (CYANOCOBALAMIN) 50 MCG tablet Take 50 mcg by mouth daily.          Allergies  Allergen Reactions  . Zocor (Simvastatin)     Past Medical History  Diagnosis Date  . Coronary artery disease   . Intermittent atrial fibrillation   .  Dyslipidemia   . Hypertension   . Anxiety   . ED (erectile dysfunction)   . RBBB plus LA hemiblock   . Palpitations   . Back pain     Past Surgical History  Procedure Date  . Cardiac catheterization 10/12/2009    NORMAL LEFT VENTRICULAR SYSTOLIC FUNCTION. EF 60-65%  . Coronary angioplasty with stent placement   . Coronary artery bypass graft   . Eye surgery 02-07-11    Right Eye    History  Smoking status  . Former Smoker  . Quit date: 01/30/1985  Smokeless tobacco  . Not on file    History  Alcohol Use No    Family History  Problem Relation Age of Onset  . Stroke Mother   . Stroke Father     Reviw of Systems:  Reviewed in the HPI.  All other systems are negative.  Physical Exam: BP 150/106  Pulse 60  Ht 5\' 10"  (1.778 m)  Wt 159 lb 12.8 oz (72.485 kg)  BMI 22.93  kg/m2 The patient is alert and oriented x 3.  The mood and affect are normal.   Skin: warm and dry.  Color is normal.    HEENT:   the sclera are nonicteric.  The mucous membranes are moist.  The carotids are 2+ without bruits.  There is no thyromegaly.  There is no JVD.    Lungs: clear.  The chest wall is non tender.    Heart: regular rate with a normal S1 and S2.  There are no murmurs, gallops, or rubs. The PMI is not displaced.     Abdomen: good bowel sounds.  There is no guarding or rebound.  There is no hepatosplenomegaly or tenderness.  There are no masses.   Extremities:  no clubbing, cyanosis, or edema.  The legs are without rashes.  The distal pulses are intact.   Neuro:  Cranial nerves II - XII are intact.  Motor and sensory functions are intact.    The gait is normal.  ECG:  Assessment / Plan:

## 2011-05-03 NOTE — Assessment & Plan Note (Signed)
His blood pressure readings at home are normal. His blood pressure remained slightly elevated now I suspect that may be due to some anxiety. We'll continue with the same medications. I've encouraged him to continue with a good diet and exercise program.

## 2011-07-27 ENCOUNTER — Encounter: Payer: Self-pay | Admitting: Cardiovascular Disease

## 2011-09-06 ENCOUNTER — Telehealth: Payer: Self-pay | Admitting: Cardiovascular Disease

## 2011-09-06 ENCOUNTER — Other Ambulatory Visit: Payer: Self-pay | Admitting: *Deleted

## 2011-09-06 MED ORDER — ROSUVASTATIN CALCIUM 10 MG PO TABS: 10.0000 mg | ORAL_TABLET | Freq: Every day | ORAL | Status: DC

## 2011-09-06 NOTE — Telephone Encounter (Signed)
We are out of samples, script sent in

## 2011-09-06 NOTE — Telephone Encounter (Signed)
New problem  Pt wants samples of crestor 10 mg please let him know

## 2011-10-25 ENCOUNTER — Other Ambulatory Visit: Payer: Self-pay | Admitting: *Deleted

## 2011-10-25 MED ORDER — HYDROCHLOROTHIAZIDE 25 MG PO TABS: 25.0000 mg | ORAL_TABLET | Freq: Every day | ORAL | Status: DC

## 2011-10-25 MED ORDER — POTASSIUM CHLORIDE ER 10 MEQ PO TBCR: 10.0000 meq | EXTENDED_RELEASE_TABLET | Freq: Every day | ORAL | Status: DC

## 2011-10-31 ENCOUNTER — Ambulatory Visit (INDEPENDENT_AMBULATORY_CARE_PROVIDER_SITE_OTHER): Payer: Medicare Other | Admitting: Cardiovascular Disease

## 2011-10-31 ENCOUNTER — Other Ambulatory Visit (INDEPENDENT_AMBULATORY_CARE_PROVIDER_SITE_OTHER): Payer: Medicare Other

## 2011-10-31 ENCOUNTER — Encounter: Payer: Self-pay | Admitting: Cardiovascular Disease

## 2011-10-31 DIAGNOSIS — I10 Essential (primary) hypertension: Secondary | ICD-10-CM

## 2011-10-31 DIAGNOSIS — I2510 Atherosclerotic heart disease of native coronary artery without angina pectoris: Secondary | ICD-10-CM

## 2011-10-31 LAB — BASIC METABOLIC PANEL
Calcium: 9.3 mg/dL (ref 8.4–10.5)
GFR: 102.75 mL/min (ref >60.00)
Potassium: 4 mEq/L (ref 3.5–5.1)
Sodium: 138 mEq/L (ref 135–145)

## 2011-10-31 LAB — HEPATIC FUNCTION PANEL
ALT: 32 U/L (ref 0–53)
AST: 28 U/L (ref 0–37)
Albumin: 3.8 g/dL (ref 3.5–5.2)

## 2011-10-31 LAB — LIPID PANEL
HDL: 59.7 mg/dL (ref >39.00)
Triglycerides: 44 mg/dL (ref 0.0–149.0)
VLDL: 8.8 mg/dL (ref 0.0–40.0)

## 2011-10-31 MED ORDER — ROSUVASTATIN CALCIUM 10 MG PO TABS: 10.0000 mg | ORAL_TABLET | Freq: Every day | ORAL | Status: DC

## 2011-10-31 NOTE — Assessment & Plan Note (Signed)
Alexander Farley's blood pressure is moderately elevated today although it has been very normal for the past month or so. He typically gets readings similar to 119/84.  He has been eating a lot of extra salt for the past day or so.  I have asked him to continue to follow his blood pressure readings. Will not increase his medications today. I'll see him again in 6 months for followup visit.

## 2011-10-31 NOTE — Patient Instructions (Signed)
Your physician wants you to follow-up in: 6 MONTHS  You will receive a reminder letter in the mail two months in advance. If you don't receive a letter, please call our office to schedule the follow-up appointment.  Your physician recommends that you return for a FASTING lipid profile: TODAY AND IN 6 MONTHS  

## 2011-10-31 NOTE — Assessment & Plan Note (Signed)
Alexander Farley is doing very well. He has not had any episodes of chest pain or shortness breath. We'll continue with the same medications. 

## 2011-10-31 NOTE — Progress Notes (Signed)
Alexander Farley Date of Birth  01-May-1933 [facility] / [facility] [address]     [address] [place], [place]  [address] [phone]  Fax  [phone]  Problem list: 1. Coronary artery disease- status post PTCA and stenting of the proximal LAD and later status post CABG 2. Intermittent atrial fibrillation 3. Dyslipidemia 4. Hypertension 5. Anxiety  History of Present Illness:  Pt is doing well.  He has occasional episodes of "hard heart beats"  His BP has been OK but is higher today.  He has taken his meds.  He's been keeping up with his blood pressure readings at home. His blood pressure readings are all in the normal range.  Current Outpatient Prescriptions on File Prior to Visit  Medication Sig Dispense Refill  . aspirin 81 MG tablet Take 81 mg by mouth daily.        . carvedilol (COREG) 12.5 MG tablet Take 1 tablet (12.5 mg total) by mouth 2 (two) times daily with a meal.  60 tablet  5  . folic acid (FOLVITE) 1 MG tablet Take 1 mg by mouth daily.        . hydrochlorothiazide (HYDRODIURIL) 25 MG tablet Take 1 tablet (25 mg total) by mouth daily.  30 tablet  6  . lisinopril (PRINIVIL,ZESTRIL) 5 MG tablet Take 1 tablet (5 mg total) by mouth 2 (two) times daily.  60 tablet  5  . LORazepam (ATIVAN) 1 MG tablet Take 1 mg by mouth 2 (two) times daily.        . Multiple Vitamin (MULTIVITAMIN) capsule Take 1 capsule by mouth daily.        . Naproxen Sodium (ALEVE PO) Take 200 mg by mouth as needed.       . nitroGLYCERIN (NITROSTAT) 0.4 MG SL tablet Place 0.4 mg under the tongue every 5 (five) minutes as needed.        . potassium chloride (K-DUR) 10 MEQ tablet Take 1 tablet (10 mEq total) by mouth daily.  30 tablet  6  . rosuvastatin (CRESTOR) 10 MG tablet Take 1 tablet (10 mg total) by mouth daily.  30 tablet  5  . Tamsulosin HCl (FLOMAX) 0.4 MG CAPS Take 0.4 mg by mouth daily.        . vitamin B-12 (CYANOCOBALAMIN) 50 MCG tablet Take 50 mcg by mouth 2  (two) times daily.       . Wheat Dextrin (BENEFIBER PO) Take by mouth as needed.          Allergies  Allergen Reactions  . Zocor (Simvastatin)     Past Medical History  Diagnosis Date  . Coronary artery disease   . Intermittent atrial fibrillation   . Dyslipidemia   . Hypertension   . Anxiety   . ED (erectile dysfunction)   . RBBB plus LA hemiblock   . Palpitations   . Back pain     Past Surgical History  Procedure Date  . Cardiac catheterization 10/12/2009    NORMAL LEFT VENTRICULAR SYSTOLIC FUNCTION. EF 60-65%  . Coronary angioplasty with stent placement   . Coronary artery bypass graft     FEb. 2011  . Eye surgery 02-07-11    Right Eye    History  Smoking status  . Former Smoker  . Quit date: 01/30/1985  Smokeless tobacco  . Not on file    History  Alcohol Use No    Family History  Problem Relation Age of Onset  . Stroke  Mother   . Stroke Father     Reviw of Systems:  Reviewed in the HPI.  All other systems are negative.  Physical Exam: BP 166/100  Pulse 69  Ht 5\' 10"  (1.778 m)  Wt 162 lb 12.8 oz (73.846 kg)  BMI 23.36 kg/m2 The patient is alert and oriented x 3.  The mood and affect are normal.   Skin: warm and dry.  Color is normal.    HEENT:   the sclera are nonicteric.  The mucous membranes are moist.  The carotids are 2+ without bruits.  There is no thyromegaly.  There is no JVD.    Lungs: clear.  The chest wall is non tender.    Heart: regular rate with a normal S1 and S2.  There are no murmurs, gallops, or rubs. The PMI is not displaced.     Abdomen: good bowel sounds.  There is no guarding or rebound.  There is no hepatosplenomegaly or tenderness.  There are no masses.   Extremities:  no clubbing, cyanosis, or edema.  The legs are without rashes.  The distal pulses are intact.   Neuro:  Cranial nerves II - XII are intact.  Motor and sensory functions are intact.    The gait is normal.  ECG:  Assessment / Plan:

## 2011-11-01 ENCOUNTER — Telehealth: Payer: Self-pay | Admitting: *Deleted

## 2011-11-01 DIAGNOSIS — I2510 Atherosclerotic heart disease of native coronary artery without angina pectoris: Secondary | ICD-10-CM

## 2011-11-01 DIAGNOSIS — I10 Essential (primary) hypertension: Secondary | ICD-10-CM

## 2011-11-01 MED ORDER — ROSUVASTATIN CALCIUM 20 MG PO TABS: 20.0000 mg | ORAL_TABLET | Freq: Every day | ORAL | Status: DC

## 2011-11-01 NOTE — Telephone Encounter (Signed)
Message copied by Antony Odea on Tue Nov 01, 2011 11:08 AM ------      Message from: Vesta Mixer      Created: Mon Oct 31, 2011  5:42 PM       LDL is still too high.  Increase crestor to 20 .  Recheck fasting labs in 3 months.

## 2011-11-01 NOTE — Telephone Encounter (Signed)
Pt called with lab results and will start increased dose of crestor 20 mg, lab redraw setup.

## 2011-11-14 ENCOUNTER — Other Ambulatory Visit: Payer: Self-pay

## 2011-11-14 MED ORDER — LISINOPRIL 5 MG PO TABS: 5.0000 mg | ORAL_TABLET | Freq: Two times a day (BID) | ORAL | Status: DC

## 2011-11-22 ENCOUNTER — Other Ambulatory Visit: Payer: Self-pay | Admitting: *Deleted

## 2011-11-22 MED ORDER — CARVEDILOL 12.5 MG PO TABS: 12.5000 mg | ORAL_TABLET | Freq: Two times a day (BID) | ORAL | Status: DC

## 2011-11-29 ENCOUNTER — Telehealth: Payer: Self-pay | Admitting: Cardiovascular Disease

## 2011-11-29 NOTE — Telephone Encounter (Signed)
New msg Pt would like samples of crestor. Please call if have any

## 2011-11-29 NOTE — Telephone Encounter (Signed)
MSG LEFT THAT SAMPLES WERE AVAILABLE/ PLACED AT FRONT DESK.

## 2011-12-26 ENCOUNTER — Telehealth: Payer: Self-pay | Admitting: Cardiovascular Disease

## 2011-12-26 NOTE — Telephone Encounter (Signed)
crestor 10 mg avail x 2 packs, [id number], 11/15

## 2011-12-26 NOTE — Telephone Encounter (Signed)
Requesting samples of crestor

## 2012-01-09 ENCOUNTER — Telehealth: Payer: Self-pay | Admitting: Cardiovascular Disease

## 2012-01-09 ENCOUNTER — Encounter: Payer: Self-pay | Admitting: Cardiovascular Disease

## 2012-01-09 NOTE — Telephone Encounter (Signed)
SAMPLES PROVIDED

## 2012-01-09 NOTE — Telephone Encounter (Signed)
New Problem:     Patient called in to see if there were any samples of rosuvastatin (CRESTOR) 20 MG tablet that he could have.  Please call back.

## 2012-02-01 ENCOUNTER — Ambulatory Visit (INDEPENDENT_AMBULATORY_CARE_PROVIDER_SITE_OTHER): Payer: Medicare Other | Admitting: *Deleted

## 2012-02-01 DIAGNOSIS — I10 Essential (primary) hypertension: Secondary | ICD-10-CM

## 2012-02-01 DIAGNOSIS — I2510 Atherosclerotic heart disease of native coronary artery without angina pectoris: Secondary | ICD-10-CM

## 2012-02-01 LAB — HEPATIC FUNCTION PANEL
ALT: 26 U/L (ref 0–53)
Albumin: 3.8 g/dL (ref 3.5–5.2)
Alkaline Phosphatase: 54 U/L (ref 39–117)
Total Protein: 6.8 g/dL (ref 6.0–8.3)

## 2012-02-01 LAB — BASIC METABOLIC PANEL
CO2: 29 mEq/L (ref 19–32)
Calcium: 8.8 mg/dL (ref 8.4–10.5)
Chloride: 108 mEq/L (ref 96–112)
Glucose, Bld: 92 mg/dL (ref 70–99)
Sodium: 141 mEq/L (ref 135–145)

## 2012-02-01 LAB — LIPID PANEL
HDL: 58.4 mg/dL (ref >39.00)
Total CHOL/HDL Ratio: 3
Triglycerides: 49 mg/dL (ref 0.0–149.0)

## 2012-04-04 ENCOUNTER — Other Ambulatory Visit: Payer: Self-pay | Admitting: Gastroenterology

## 2012-04-09 ENCOUNTER — Telehealth: Payer: Self-pay | Admitting: Cardiovascular Disease

## 2012-04-09 NOTE — Telephone Encounter (Signed)
Pt advised to come pic up crestor samples at front desk--pt agrees--nt

## 2012-04-09 NOTE — Telephone Encounter (Signed)
Pt needs samples crestor 20mg  please call and advise is we have any

## 2012-05-07 ENCOUNTER — Telehealth: Payer: Self-pay | Admitting: Cardiovascular Disease

## 2012-05-07 NOTE — Telephone Encounter (Signed)
plz return call to patient [phone] regarding Crestor 20 MG  Samples.

## 2012-05-07 NOTE — Telephone Encounter (Signed)
Samples provided, pt aware  

## 2012-05-24 ENCOUNTER — Other Ambulatory Visit: Payer: Self-pay | Admitting: *Deleted

## 2012-05-24 MED ORDER — HYDROCHLOROTHIAZIDE 25 MG PO TABS: 25.0000 mg | ORAL_TABLET | Freq: Every day | ORAL | Status: DC

## 2012-05-24 MED ORDER — POTASSIUM CHLORIDE ER 10 MEQ PO TBCR: 10.0000 meq | EXTENDED_RELEASE_TABLET | Freq: Every day | ORAL | Status: DC

## 2012-05-24 NOTE — Telephone Encounter (Signed)
Fax Received. Refill Completed. Alexander Farley (R.M.A)   

## 2012-05-29 ENCOUNTER — Ambulatory Visit (INDEPENDENT_AMBULATORY_CARE_PROVIDER_SITE_OTHER): Payer: Medicare Other | Admitting: Cardiovascular Disease

## 2012-05-29 ENCOUNTER — Encounter: Payer: Self-pay | Admitting: Cardiovascular Disease

## 2012-05-29 VITALS — BP 140/92 | HR 63 | Ht 70.0 in | Wt 157.8 lb

## 2012-05-29 DIAGNOSIS — R200 Anesthesia of skin: Secondary | ICD-10-CM

## 2012-05-29 DIAGNOSIS — R209 Unspecified disturbances of skin sensation: Secondary | ICD-10-CM

## 2012-05-29 DIAGNOSIS — I2510 Atherosclerotic heart disease of native coronary artery without angina pectoris: Secondary | ICD-10-CM

## 2012-05-29 DIAGNOSIS — I10 Essential (primary) hypertension: Secondary | ICD-10-CM

## 2012-05-29 NOTE — Assessment & Plan Note (Signed)
Alexander Farley is doing very well. He's not had any episodes of angina.  Some home health nurses told him that he have a heart murmur. He has been very concerned about this new heart murmur.  Today on exam he does not have any significant murmur. He does have a split second heart sound which is due to his right bundle branch block. I do not think that he has any concern for significant valvular disease.

## 2012-05-29 NOTE — Patient Instructions (Addendum)
Your physician wants you to follow-up in: 6 months  You will receive a reminder letter in the mail two months in advance. If you don't receive a letter, please call our office to schedule the follow-up appointment.  Your physician recommends that you continue on your current medications as directed. Please refer to the Current Medication list given to you today.  

## 2012-05-29 NOTE — Progress Notes (Signed)
Alexander Farley Date of Birth  [birth year] [facility] / [facility] [address]     [address] [place], [place]  [address] [phone]  Fax  [phone]  Problem list: 1. Coronary artery disease- status post PTCA and stenting of the proximal LAD and later status post CABG 2. Intermittent atrial fibrillation 3. Dyslipidemia 4. Hypertension 5. Anxiety  History of Present Illness:  Pt is doing well.  He has occasional tingling sensation in his cehst  His BP has been OK but is higher today.  He has taken his meds.  He's been keeping up with his blood pressure readings at home. His blood pressure readings are all in the normal range.  He has not been watching his salt as closely as he should.  A home health nurse came out to visit him. She told him that he had a heart murmur. He's been very concerned about this new murmur.  Current Outpatient Prescriptions on File Prior to Visit  Medication Sig Dispense Refill  . aspirin 81 MG tablet Take 81 mg by mouth daily.        . carvedilol (COREG) 12.5 MG tablet Take 1 tablet (12.5 mg total) by mouth 2 (two) times daily with a meal.  60 tablet  5  . folic acid (FOLVITE) 1 MG tablet Take 1 mg by mouth daily.        . hydrochlorothiazide (HYDRODIURIL) 25 MG tablet Take 1 tablet (25 mg total) by mouth daily.  30 tablet  6  . lisinopril (PRINIVIL,ZESTRIL) 5 MG tablet Take 1 tablet (5 mg total) by mouth 2 (two) times daily.  60 tablet  6  . LORazepam (ATIVAN) 1 MG tablet Take 1 mg by mouth 2 (two) times daily.        . Multiple Vitamin (MULTIVITAMIN) capsule Take 1 capsule by mouth daily.        . Naproxen Sodium (ALEVE PO) Take 200 mg by mouth as needed.       . nitroGLYCERIN (NITROSTAT) 0.4 MG SL tablet Place 0.4 mg under the tongue every 5 (five) minutes as needed.        . potassium chloride (K-DUR) 10 MEQ tablet Take 1 tablet (10 mEq total) by mouth daily.  30 tablet  6  . rosuvastatin (CRESTOR) 20 MG tablet Take 1  tablet (20 mg total) by mouth daily.  30 tablet  5  . Tamsulosin HCl (FLOMAX) 0.4 MG CAPS Take 0.4 mg by mouth daily.        . vitamin B-12 (CYANOCOBALAMIN) 50 MCG tablet Take 50 mcg by mouth 2 (two) times daily.       . Wheat Dextrin (BENEFIBER PO) Take by mouth as needed.          Allergies  Allergen Reactions  . Zocor (Simvastatin)     Past Medical History  Diagnosis Date  . Coronary artery disease   . Intermittent atrial fibrillation   . Dyslipidemia   . Hypertension   . Anxiety   . ED (erectile dysfunction)   . RBBB plus LA hemiblock   . Palpitations   . Back pain     Past Surgical History  Procedure Date  . Cardiac catheterization 10/12/2009    NORMAL LEFT VENTRICULAR SYSTOLIC FUNCTION. EF 60-65%  . Coronary angioplasty with stent placement   . Coronary artery bypass graft     FEb. 2011  . Eye surgery 02-07-11    Right Eye    History  Smoking  status  . Former Smoker  . Quit date: 01/30/1985  Smokeless tobacco  . Not on file    History  Alcohol Use No    Family History  Problem Relation Age of Onset  . Stroke Mother   . Stroke Father     Reviw of Systems:  Reviewed in the HPI.  All other systems are negative.  Physical Exam: BP 140/92  Pulse 63  Ht 5\' 10"  (1.778 m)  Wt 157 lb 12.8 oz (71.578 kg)  BMI 22.64 kg/m2 The patient is alert and oriented x 3.  The mood and affect are normal.   Skin: warm and dry.  Color is normal.    HEENT:   the sclera are nonicteric.  The mucous membranes are moist.  The carotids are 2+ without bruits.  There is no thyromegaly.  There is no JVD.    Lungs: clear.  The chest wall is non tender.    Heart: regular rate with a normal S1 and split S2.  There are no significant murmurs, gallops, or rubs. The PMI is not displaced.     Abdomen: good bowel sounds.  There is no guarding or rebound.  There is no hepatosplenomegaly or tenderness.  There are no masses.   Extremities:  no clubbing, cyanosis, or edema.  The legs  are without rashes.  The distal pulses are intact.   Neuro:  Cranial nerves II - XII are intact.  Motor and sensory functions are intact.    The gait is normal.  ECG: Oct. 1, 2013-normal sinus rhythm at 63 beats a minute. He has a right bundle branch block. His left anterior fascicular block. There is left ventricular hypertrophy with QRS widening. Assessment / Plan:

## 2012-05-30 ENCOUNTER — Other Ambulatory Visit: Payer: Self-pay | Admitting: *Deleted

## 2012-05-30 NOTE — Telephone Encounter (Signed)
Opened in Error.

## 2012-06-04 ENCOUNTER — Encounter: Payer: Self-pay | Admitting: Cardiovascular Disease

## 2012-06-21 ENCOUNTER — Other Ambulatory Visit: Payer: Self-pay | Admitting: Nurse Practitioner

## 2012-06-26 ENCOUNTER — Encounter: Payer: Self-pay | Admitting: *Deleted

## 2012-06-26 ENCOUNTER — Telehealth: Payer: Self-pay | Admitting: Cardiovascular Disease

## 2012-06-26 ENCOUNTER — Ambulatory Visit (INDEPENDENT_AMBULATORY_CARE_PROVIDER_SITE_OTHER): Payer: Medicare Other | Admitting: *Deleted

## 2012-06-26 VITALS — BP 117/76 | HR 55 | Ht 69.0 in | Wt 162.0 lb

## 2012-06-26 DIAGNOSIS — R0789 Other chest pain: Secondary | ICD-10-CM

## 2012-06-26 LAB — CBC WITH DIFFERENTIAL/PLATELET
Eosinophils Relative: 1.6 % (ref 0.0–5.0)
HCT: 47.4 % (ref 39.0–52.0)
Hemoglobin: 15.6 g/dL (ref 13.0–17.0)
Lymphs Abs: 2.4 10*3/uL (ref 0.7–4.0)
MCV: 93.3 fl (ref 78.0–100.0)
Monocytes Absolute: 0.4 10*3/uL (ref 0.1–1.0)
Monocytes Relative: 6.6 % (ref 3.0–12.0)
Neutro Abs: 3.1 10*3/uL (ref 1.4–7.7)
RDW: 14.8 % — ABNORMAL HIGH (ref 11.5–14.6)
WBC: 6 10*3/uL (ref 4.5–10.5)

## 2012-06-26 LAB — BASIC METABOLIC PANEL
CO2: 30 mEq/L (ref 19–32)
Calcium: 9.5 mg/dL (ref 8.4–10.5)
Creatinine, Ser: 1 mg/dL (ref 0.4–1.5)
GFR: 91.06 mL/min (ref >60.00)
Glucose, Bld: 102 mg/dL — ABNORMAL HIGH (ref 70–99)

## 2012-06-26 LAB — TROPONIN I: Troponin I: <0.3 ng/mL (ref <0.30)

## 2012-06-26 NOTE — Patient Instructions (Addendum)
Pt had ekg done , for what he thought may be A flutter, ekg reviewed by Lawson Fiscal gerhardt np and dr Patty Sermons, no significant change from last ekg, labs and 24 holter monitor were ordered, pt agreed to plan. 24 hr  ecardio monitor placed.

## 2012-06-26 NOTE — Telephone Encounter (Signed)
New problem:  C/O atrial flutter.

## 2012-06-26 NOTE — Telephone Encounter (Signed)
C/o uneasy feeling in chest, denies cp/ no sob/no lightheadedness. States the feeling started Sunday 06/24/12 morning. Pt states he is stressed caring for in ill adult chid. Pt to come in for ekg this morning, he agreed to plan.

## 2012-07-02 ENCOUNTER — Telehealth: Payer: Self-pay | Admitting: *Deleted

## 2012-07-02 NOTE — Telephone Encounter (Signed)
Pt was given results of ecardio Holter. Dr Elease Hashimoto said results were ok but call with any further episodes, pt states he has been feeling well and no further problems. Pt agreed to plan.

## 2012-08-13 ENCOUNTER — Other Ambulatory Visit: Payer: Self-pay | Admitting: *Deleted

## 2012-08-13 MED ORDER — CARVEDILOL 12.5 MG PO TABS: 12.5000 mg | ORAL_TABLET | Freq: Two times a day (BID) | ORAL | Status: DC

## 2012-08-13 NOTE — Telephone Encounter (Signed)
Fax Received. Refill Completed. Alexander Farley (R.M.A)   

## 2012-09-10 ENCOUNTER — Telehealth: Payer: Self-pay | Admitting: Cardiovascular Disease

## 2012-09-10 NOTE — Telephone Encounter (Signed)
msg left samples at front desk

## 2012-09-10 NOTE — Telephone Encounter (Signed)
New Problem:    Patient called in wanting to know if there were any samples of rosuvastatin (CRESTOR) 20 MG tablet available for him to have.  Please call back.

## 2012-11-06 ENCOUNTER — Telehealth: Payer: Self-pay | Admitting: *Deleted

## 2012-11-06 ENCOUNTER — Telehealth: Payer: Self-pay | Admitting: Cardiovascular Disease

## 2012-11-06 NOTE — Telephone Encounter (Signed)
Samples available, crestor 20 mg, lot [id number] exp 03-2015

## 2012-11-06 NOTE — Telephone Encounter (Signed)
New Problem: ° ° ° °Patient called in wanting to know if there were any samples of rosuvastatin (CRESTOR) 20 MG tablet available for him to have.  Please call back. °

## 2013-01-03 ENCOUNTER — Other Ambulatory Visit: Payer: Self-pay | Admitting: *Deleted

## 2013-01-03 MED ORDER — POTASSIUM CHLORIDE ER 10 MEQ PO TBCR: 10.0000 meq | EXTENDED_RELEASE_TABLET | Freq: Every day | ORAL | Status: DC

## 2013-01-08 ENCOUNTER — Other Ambulatory Visit: Payer: Self-pay | Admitting: *Deleted

## 2013-01-08 MED ORDER — HYDROCHLOROTHIAZIDE 25 MG PO TABS: 25.0000 mg | ORAL_TABLET | Freq: Every day | ORAL | Status: DC

## 2013-01-08 MED ORDER — POTASSIUM CHLORIDE ER 10 MEQ PO TBCR: 10.0000 meq | EXTENDED_RELEASE_TABLET | Freq: Every day | ORAL | Status: DC

## 2013-01-08 NOTE — Telephone Encounter (Signed)
Fax Received. Refill Completed. Alexander Farley (R.M.A)   

## 2013-01-08 NOTE — Telephone Encounter (Signed)
Fax Received. Refill Completed. Robertta Halfhill Chowoe (R.M.A)   

## 2013-01-25 ENCOUNTER — Other Ambulatory Visit: Payer: Medicare Other

## 2013-01-25 ENCOUNTER — Ambulatory Visit (INDEPENDENT_AMBULATORY_CARE_PROVIDER_SITE_OTHER): Payer: Medicare Other | Admitting: Cardiovascular Disease

## 2013-01-25 ENCOUNTER — Encounter: Payer: Self-pay | Admitting: Cardiovascular Disease

## 2013-01-25 VITALS — BP 150/82 | HR 58 | Ht 69.0 in | Wt 161.4 lb

## 2013-01-25 DIAGNOSIS — E785 Hyperlipidemia, unspecified: Secondary | ICD-10-CM

## 2013-01-25 DIAGNOSIS — I2510 Atherosclerotic heart disease of native coronary artery without angina pectoris: Secondary | ICD-10-CM

## 2013-01-25 LAB — HEPATIC FUNCTION PANEL
ALT: 20 U/L (ref 0–53)
AST: 22 U/L (ref 0–37)
Bilirubin, Direct: 0.1 mg/dL (ref 0.0–0.3)
Total Bilirubin: 0.8 mg/dL (ref 0.3–1.2)
Total Protein: 7 g/dL (ref 6.0–8.3)

## 2013-01-25 LAB — BASIC METABOLIC PANEL
BUN: 16 mg/dL (ref 6–23)
Calcium: 9.2 mg/dL (ref 8.4–10.5)
Creatinine, Ser: 0.9 mg/dL (ref 0.4–1.5)
GFR: 107.81 mL/min (ref >60.00)

## 2013-01-25 LAB — LIPID PANEL
Cholesterol: 168 mg/dL (ref 0–200)
LDL Cholesterol: 104 mg/dL — ABNORMAL HIGH (ref 0–99)

## 2013-01-25 NOTE — Patient Instructions (Addendum)
Your physician wants you to follow-up in: 6 months  You will receive a reminder letter in the mail two months in advance. If you don't receive a letter, please call our office to schedule the follow-up appointment.   Your physician recommends that you return for a FASTING lipid profile: today  

## 2013-01-25 NOTE — Progress Notes (Signed)
Alexander Farley Date of Birth  [birth year] [facility] / [facility] [address]     [address] [place], [place]  [address] [phone]  Fax  [phone]  Problem list: 1. Coronary artery disease- status post PTCA and stenting of the proximal LAD and later status post CABG 2. Intermittent atrial fibrillation 3. Dyslipidemia 4. Hypertension 5. Anxiety  History of Present Illness:  Pt is doing well.  He has occasional tingling sensation in his chest  His BP has been OK but is higher today.  He has taken his meds.  He's been keeping up with his blood pressure readings at home. His blood pressure readings are all in the normal range.  He has not been watching his salt as closely as he should.  A home health nurse came out to visit him. She told him that he had a heart murmur. He's been very concerned about this new murmur.  Jan 25, 2013:  Alexander Farley is feeling well.  He has some mild orthostasis symptoms.   Current Outpatient Prescriptions on File Prior to Visit  Medication Sig Dispense Refill  . aspirin 81 MG tablet Take 81 mg by mouth daily.        . carvedilol (COREG) 12.5 MG tablet Take 1 tablet (12.5 mg total) by mouth 2 (two) times daily with a meal.  60 tablet  5  . folic acid (FOLVITE) 1 MG tablet Take 1 mg by mouth daily.        . hydrochlorothiazide (HYDRODIURIL) 25 MG tablet Take 1 tablet (25 mg total) by mouth daily.  30 tablet  6  . lisinopril (PRINIVIL,ZESTRIL) 5 MG tablet TAKE 1 TABLET (5 MG TOTAL) BY MOUTH 2 (TWO) TIMES DAILY.  60 tablet  9  . LORazepam (ATIVAN) 1 MG tablet Take 1 mg by mouth 2 (two) times daily.        . Multiple Vitamin (MULTIVITAMIN) capsule Take 1 capsule by mouth daily.        . Naproxen Sodium (ALEVE PO) Take 200 mg by mouth as needed.       . nitroGLYCERIN (NITROSTAT) 0.4 MG SL tablet Place 0.4 mg under the tongue every 5 (five) minutes as needed.        . potassium chloride (K-DUR) 10 MEQ tablet Take 1 tablet (10 mEq  total) by mouth daily.  30 tablet  6  . rosuvastatin (CRESTOR) 20 MG tablet Take 1 tablet (20 mg total) by mouth daily.  30 tablet  5  . Tamsulosin HCl (FLOMAX) 0.4 MG CAPS Take 0.4 mg by mouth daily.        . vitamin B-12 (CYANOCOBALAMIN) 50 MCG tablet Take 50 mcg by mouth 2 (two) times daily.       . Wheat Dextrin (BENEFIBER PO) Take by mouth as needed.         No current facility-administered medications on file prior to visit.    Allergies  Allergen Reactions  . Zocor (Simvastatin)     Past Medical History  Diagnosis Date  . Coronary artery disease   . Intermittent atrial fibrillation   . Dyslipidemia   . Hypertension   . Anxiety   . ED (erectile dysfunction)   . RBBB plus LA hemiblock   . Palpitations   . Back pain     Past Surgical History  Procedure Laterality Date  . Cardiac catheterization  10/12/2009    NORMAL LEFT VENTRICULAR SYSTOLIC FUNCTION. EF 60-65%  . Coronary angioplasty with stent  placement    . Coronary artery bypass graft      FEb. 2011  . Eye surgery  02-07-11    Right Eye    History  Smoking status  . Former Smoker  . Quit date: 01/30/1985  Smokeless tobacco  . Not on file    History  Alcohol Use No    Family History  Problem Relation Age of Onset  . Stroke Mother   . Stroke Father     Reviw of Systems:  Reviewed in the HPI.  All other systems are negative.  Physical Exam: BP 150/82  Pulse 58  Ht 5\' 9"  (1.753 m)  Wt 161 lb 6.4 oz (73.211 kg)  BMI 23.82 kg/m2  SpO2 99% The patient is alert and oriented x 3.  The mood and affect are normal.   Skin: warm and dry.  Color is normal.    HEENT:   the sclera are nonicteric.  The mucous membranes are moist.  The carotids are 2+ without bruits.  There is no thyromegaly.  There is no JVD.    Lungs: clear.  The chest wall is non tender.    Heart: regular rate with a normal S1 and split S2.  There are no significant murmurs, gallops, or rubs. The PMI is not displaced.     Abdomen:  good bowel sounds.  There is no guarding or rebound.  There is no hepatosplenomegaly or tenderness.  There are no masses.   Extremities:  no clubbing, cyanosis, or edema.  The legs are without rashes.  The distal pulses are intact.   Neuro:  Cranial nerves II - XII are intact.  Motor and sensory functions are intact.    The gait is normal.  ECG:  Assessment / Plan:

## 2013-01-25 NOTE — Assessment & Plan Note (Signed)
His blood pressure remains mildly elevated. Next eating some extra salt a week. He knows that he needs to cut out his salt.

## 2013-01-25 NOTE — Assessment & Plan Note (Signed)
Alexander Farley is stable. He's not having any episodes of chest pain. He has not had to use any nitroglycerin.  He would like to be able to try Viagra or similar medication. I think that this would be okay from my standpoint since he has not had to take any nitroglycerin. We'll have him contact his primary medical doctor for that.

## 2013-03-26 ENCOUNTER — Telehealth: Payer: Self-pay | Admitting: Cardiovascular Disease

## 2013-03-26 NOTE — Telephone Encounter (Signed)
Samples placed at desk/ pt aware

## 2013-03-26 NOTE — Telephone Encounter (Signed)
New problem   Pt calling for samples of crestor

## 2013-03-26 NOTE — Telephone Encounter (Signed)
Pt c/o dizziness at times. Reviewed bp/p recordings and he had one noted low recording 129/84 p 48 84/58 p 79  It was then he was feeling dizzy. All other vitals have been in normal range since. Advised him to continue to monitor especially when he feels dizzy and to call with abn readings. Pt verbalized understanding.

## 2013-03-29 ENCOUNTER — Telehealth: Payer: Self-pay | Admitting: Cardiovascular Disease

## 2013-03-29 NOTE — Telephone Encounter (Signed)
Most of BP and HR readings look good.  They are not consistantly high or low.  Continue to monitor.  Watch your diet and minimize salt.  We will address at office visit. Keep a record of all of your BP and HR readings and mail in the whole list

## 2013-03-29 NOTE — Telephone Encounter (Signed)
New Prob  Pt would like to speak with a nurse. He would not tell what it was about.

## 2013-03-29 NOTE — Telephone Encounter (Signed)
I spoke with the pt and he spoke with Jodette RN 03/26/13 about episodes of dizziness. The pt does monitor his BP and pulse and he had only had two abnormal readings 129/84, pulse 48 and 84/58, pulse 79.  No medication changes were made during 03/26/13 conversation.  The pt called today because he is still have spells of weakness and being lightheaded. The pt states that the [employer] nurse visited him yesterday and advised that he contact our office. Today the pt's vitals are 127/75, pulse 64 and 110/79, 63.  The pt would like to know if a change can be made in his medications to help with his symptoms.  I will forward this message to Dr Elease Hashimoto to review and make further recommendations.

## 2013-03-29 NOTE — Telephone Encounter (Signed)
I spoke with the pt and made him aware of Dr Harvie Bridge recommendations.  The pt will continue to monitor his BP and pulse and write down any symptoms he is having when checking his vital signs. The pt will send in a record of his readings in a few weeks. The pt was very thankful for the return call.

## 2013-04-17 ENCOUNTER — Telehealth: Payer: Self-pay | Admitting: Cardiovascular Disease

## 2013-04-17 NOTE — Telephone Encounter (Signed)
Walk in pt Form " Pt Dropped Of Sealed Envelope" gave to [facility] 04/17/13/KM

## 2013-06-17 ENCOUNTER — Other Ambulatory Visit: Payer: Self-pay | Admitting: Cardiovascular Disease

## 2013-07-15 ENCOUNTER — Ambulatory Visit (INDEPENDENT_AMBULATORY_CARE_PROVIDER_SITE_OTHER): Payer: Medicare Other | Admitting: Cardiovascular Disease

## 2013-07-15 ENCOUNTER — Encounter (INDEPENDENT_AMBULATORY_CARE_PROVIDER_SITE_OTHER): Payer: Self-pay

## 2013-07-15 ENCOUNTER — Encounter: Payer: Self-pay | Admitting: Cardiovascular Disease

## 2013-07-15 VITALS — BP 177/97 | HR 65 | Ht 69.0 in | Wt 165.8 lb

## 2013-07-15 DIAGNOSIS — I10 Essential (primary) hypertension: Secondary | ICD-10-CM

## 2013-07-15 DIAGNOSIS — I2510 Atherosclerotic heart disease of native coronary artery without angina pectoris: Secondary | ICD-10-CM

## 2013-07-15 LAB — BASIC METABOLIC PANEL
BUN: 12 mg/dL (ref 6–23)
Calcium: 9.1 mg/dL (ref 8.4–10.5)
GFR: 110.57 mL/min (ref >60.00)
Glucose, Bld: 95 mg/dL (ref 70–99)
Sodium: 138 mEq/L (ref 135–145)

## 2013-07-15 LAB — HEPATIC FUNCTION PANEL
ALT: 17 U/L (ref 0–53)
AST: 19 U/L (ref 0–37)
Bilirubin, Direct: 0.1 mg/dL (ref 0.0–0.3)
Total Bilirubin: 0.7 mg/dL (ref 0.3–1.2)

## 2013-07-15 NOTE — Patient Instructions (Addendum)
Your physician wants you to follow-up in: 6 months  You will receive a reminder letter in the mail two months in advance. If you don't receive a letter, please call our office to schedule the follow-up appointment.   Your physician recommends that you continue on your current medications as directed. Please refer to the Current Medication list given to you today.   Your physician recommends that you return for a FASTING lipid profile: today

## 2013-07-15 NOTE — Progress Notes (Signed)
Alexander Farley Date of Birth  05/30/1933 [facility] / [facility] [address]     [address] [place], [place]  [address] [phone]  Fax  [phone]  Problem list: 1. Coronary artery disease- status post PTCA and stenting of the proximal LAD and later status post CABG 2. Intermittent atrial fibrillation 3. Dyslipidemia 4. Hypertension 5. Anxiety  History of Present Illness:  Pt is doing well.  He has occasional tingling sensation in his chest  His BP has been OK but is higher today.  He has taken his meds.  He's been keeping up with his blood pressure readings at home. His blood pressure readings are all in the normal range.  He has not been watching his salt as closely as he should.  A home health nurse came out to visit him. She told him that he had a heart murmur. He's been very concerned about this new murmur.  Jan 25, 2013:  [name] is feeling well.  He has some mild orthostasis symptoms.   Nov. 17, 2014:  [name] is feeling well.  BP is a bit high today.  His readings at home are all in the normal range.  No CP or dyspnea.  Exercising regularly with his dogs and also is going to the [facility]    Current Outpatient Prescriptions on File Prior to Visit  Medication Sig Dispense Refill  . aspirin 81 MG tablet Take 81 mg by mouth daily.        . carvedilol (COREG) 12.5 MG tablet Take 1 tablet (12.5 mg total) by mouth 2 (two) times daily with a meal.  60 tablet  5  . folic acid (FOLVITE) 1 MG tablet Take 1 mg by mouth daily.        . hydrochlorothiazide (HYDRODIURIL) 25 MG tablet Take 1 tablet (25 mg total) by mouth daily.  30 tablet  6  . lisinopril (PRINIVIL,ZESTRIL) 5 MG tablet TAKE 1 TABLET (5 MG TOTAL) BY MOUTH 2 (TWO) TIMES DAILY.  60 tablet  0  . LORazepam (ATIVAN) 1 MG tablet Take 1 mg by mouth 2 (two) times daily.        . Multiple Vitamin (MULTIVITAMIN) capsule Take 1 capsule by mouth daily.        . Naproxen Sodium (ALEVE PO) Take 200 mg by  mouth as needed.       . nitroGLYCERIN (NITROSTAT) 0.4 MG SL tablet Place 0.4 mg under the tongue every 5 (five) minutes as needed.        . rosuvastatin (CRESTOR) 20 MG tablet Take 1 tablet (20 mg total) by mouth daily.  30 tablet  5  . Tamsulosin HCl (FLOMAX) 0.4 MG CAPS Take 0.4 mg by mouth daily.        . vitamin B-12 (CYANOCOBALAMIN) 50 MCG tablet Take 50 mcg by mouth 2 (two) times daily.       . Wheat Dextrin (BENEFIBER PO) Take by mouth as needed.        . potassium chloride (K-DUR) 10 MEQ tablet Take 1 tablet (10 mEq total) by mouth daily.  30 tablet  6   No current facility-administered medications on file prior to visit.    Allergies  Allergen Reactions  . Zocor [Simvastatin]     Past Medical History  Diagnosis Date  . Coronary artery disease   . Intermittent atrial fibrillation   . Dyslipidemia   . Hypertension   . Anxiety   . ED (erectile dysfunction)   .  RBBB plus LA hemiblock   . Palpitations   . Back pain     Past Surgical History  Procedure Laterality Date  . Cardiac catheterization  10/12/2009    NORMAL LEFT VENTRICULAR SYSTOLIC FUNCTION. EF 60-65%  . Coronary angioplasty with stent placement    . Coronary artery bypass graft      FEb. 2011  . Eye surgery  02-07-11    Right Eye    History  Smoking status  . Former Smoker  . Quit date: 01/30/1985  Smokeless tobacco  . Not on file    History  Alcohol Use No    Family History  Problem Relation Age of Onset  . Stroke Mother   . Stroke Father     Reviw of Systems:  Reviewed in the HPI.  All other systems are negative.  Physical Exam: BP 177/97  Pulse 65  Ht 5\' 9"  (1.753 m)  Wt 165 lb 12.8 oz (75.206 kg)  BMI 24.47 kg/m2 The patient is alert and oriented x 3.  The mood and affect are normal.   Skin: warm and dry.  Color is normal.    HEENT:   the sclera are nonicteric.  The mucous membranes are moist.  The carotids are 2+ without bruits.  There is no thyromegaly.  There is no JVD.     Lungs: clear.  The chest wall is non tender.    Heart: regular rate with a normal S1 and split S2.  There are no significant murmurs, gallops, or rubs. The PMI is not displaced.     Abdomen: good bowel sounds.  There is no guarding or rebound.  There is no hepatosplenomegaly or tenderness.  There are no masses.   Extremities:  no clubbing, cyanosis, or edema.  The legs are without rashes.  The distal pulses are intact.   Neuro:  Cranial nerves II - XII are intact.  Motor and sensory functions are intact.    The gait is normal.  ECG:  Nov. 17, 2014:  NSR at 65, RBBB, LAHB, LVH with QRS widening.   Assessment / Plan:

## 2013-07-15 NOTE — Assessment & Plan Note (Signed)
Damarko is doing well.  No CP.  exercises regularly.   Will check fasting labs today.

## 2013-07-16 ENCOUNTER — Telehealth: Payer: Self-pay | Admitting: *Deleted

## 2013-07-16 DIAGNOSIS — E785 Hyperlipidemia, unspecified: Secondary | ICD-10-CM

## 2013-07-16 MED ORDER — EZETIMIBE 10 MG PO TABS: 10.0000 mg | ORAL_TABLET | Freq: Every day | ORAL | Status: DC

## 2013-07-16 NOTE — Telephone Encounter (Signed)
Message copied by Antony Odea on Tue Jul 16, 2013 10:11 AM ------      Message from: [place], [place] J      Created: Tue Jul 16, 2013  5:15 AM       LDL is 100,  Add Zetia 10 mg a day.  Recheck labs in 3 months       ------

## 2013-07-16 NOTE — Telephone Encounter (Signed)
Labs were reviewed/ pt agreed to start med/3 month lab date was given.

## 2013-07-31 ENCOUNTER — Telehealth: Payer: Self-pay

## 2013-07-31 NOTE — Telephone Encounter (Signed)
Called wanting samples of crestor  20 mg placed up front

## 2013-08-20 ENCOUNTER — Other Ambulatory Visit: Payer: Self-pay | Admitting: Cardiovascular Disease

## 2013-08-21 ENCOUNTER — Other Ambulatory Visit: Payer: Self-pay | Admitting: Cardiovascular Disease

## 2013-09-19 ENCOUNTER — Other Ambulatory Visit: Payer: Self-pay | Admitting: Cardiovascular Disease

## 2013-09-24 ENCOUNTER — Other Ambulatory Visit: Payer: Self-pay | Admitting: Cardiovascular Disease

## 2013-09-25 ENCOUNTER — Telehealth: Payer: Self-pay

## 2013-09-25 NOTE — Telephone Encounter (Signed)
Continue crestor.  Ok to DC zetia

## 2013-09-25 NOTE — Telephone Encounter (Signed)
Patient called wanting samples of zetia and crestor placed samples up front

## 2013-09-25 NOTE — Telephone Encounter (Signed)
Error

## 2013-09-25 NOTE — Telephone Encounter (Signed)
-----   Message from Guinevere Ferrari sent at 09/25/2013 11:35 AM ----- Patient's pharmacy called and the patient can not afford zetia and would like tochange to something cheaper

## 2013-09-25 NOTE — Telephone Encounter (Signed)
error 

## 2013-09-26 NOTE — Telephone Encounter (Signed)
Pt will pick up samples of crestor and zetia/ pt will then dc zetia

## 2013-10-16 ENCOUNTER — Other Ambulatory Visit (INDEPENDENT_AMBULATORY_CARE_PROVIDER_SITE_OTHER): Payer: Medicare Other

## 2013-10-16 DIAGNOSIS — E785 Hyperlipidemia, unspecified: Secondary | ICD-10-CM

## 2013-10-16 LAB — BASIC METABOLIC PANEL
BUN: 18 mg/dL (ref 6–23)
CALCIUM: 9.6 mg/dL (ref 8.4–10.5)
CO2: 29 mEq/L (ref 19–32)
CREATININE: 1 mg/dL (ref 0.4–1.5)
Chloride: 104 mEq/L (ref 96–112)
GFR: 90.75 mL/min (ref >60.00)
Glucose, Bld: 88 mg/dL (ref 70–99)
Potassium: 4.3 mEq/L (ref 3.5–5.1)
Sodium: 138 mEq/L (ref 135–145)

## 2013-10-16 LAB — LIPID PANEL
CHOLESTEROL: 143 mg/dL (ref 0–200)
HDL: 50 mg/dL (ref >39.00)
LDL Cholesterol: 85 mg/dL (ref 0–99)
TRIGLYCERIDES: 41 mg/dL (ref 0.0–149.0)
Total CHOL/HDL Ratio: 3
VLDL: 8.2 mg/dL (ref 0.0–40.0)

## 2013-10-16 LAB — HEPATIC FUNCTION PANEL
ALBUMIN: 3.8 g/dL (ref 3.5–5.2)
ALK PHOS: 51 U/L (ref 39–117)
ALT: 36 U/L (ref 0–53)
AST: 32 U/L (ref 0–37)
Bilirubin, Direct: 0.1 mg/dL (ref 0.0–0.3)
TOTAL PROTEIN: 7.1 g/dL (ref 6.0–8.3)
Total Bilirubin: 0.5 mg/dL (ref 0.3–1.2)

## 2013-11-15 ENCOUNTER — Encounter (HOSPITAL_COMMUNITY): Payer: Self-pay | Admitting: Emergency Medicine

## 2013-11-15 DIAGNOSIS — R5381 Other malaise: Principal | ICD-10-CM | POA: Insufficient documentation

## 2013-11-15 DIAGNOSIS — I4891 Unspecified atrial fibrillation: Secondary | ICD-10-CM | POA: Insufficient documentation

## 2013-11-15 DIAGNOSIS — R55 Syncope and collapse: Secondary | ICD-10-CM | POA: Insufficient documentation

## 2013-11-15 DIAGNOSIS — Z87891 Personal history of nicotine dependence: Secondary | ICD-10-CM | POA: Insufficient documentation

## 2013-11-15 DIAGNOSIS — F411 Generalized anxiety disorder: Secondary | ICD-10-CM | POA: Insufficient documentation

## 2013-11-15 DIAGNOSIS — E785 Hyperlipidemia, unspecified: Secondary | ICD-10-CM | POA: Insufficient documentation

## 2013-11-15 DIAGNOSIS — I10 Essential (primary) hypertension: Secondary | ICD-10-CM | POA: Insufficient documentation

## 2013-11-15 DIAGNOSIS — R5383 Other fatigue: Principal | ICD-10-CM

## 2013-11-15 DIAGNOSIS — I4460 Unspecified fascicular block: Secondary | ICD-10-CM | POA: Insufficient documentation

## 2013-11-15 DIAGNOSIS — I4510 Unspecified right bundle-branch block: Secondary | ICD-10-CM | POA: Insufficient documentation

## 2013-11-15 DIAGNOSIS — Z951 Presence of aortocoronary bypass graft: Secondary | ICD-10-CM | POA: Insufficient documentation

## 2013-11-15 DIAGNOSIS — I2510 Atherosclerotic heart disease of native coronary artery without angina pectoris: Secondary | ICD-10-CM | POA: Insufficient documentation

## 2013-11-15 DIAGNOSIS — Z9861 Coronary angioplasty status: Secondary | ICD-10-CM | POA: Insufficient documentation

## 2013-11-15 DIAGNOSIS — Z9889 Other specified postprocedural states: Secondary | ICD-10-CM | POA: Insufficient documentation

## 2013-11-15 DIAGNOSIS — Z888 Allergy status to other drugs, medicaments and biological substances status: Secondary | ICD-10-CM | POA: Insufficient documentation

## 2013-11-15 DIAGNOSIS — Z87448 Personal history of other diseases of urinary system: Secondary | ICD-10-CM | POA: Insufficient documentation

## 2013-11-15 LAB — CBC
HCT: 41.3 % (ref 39.0–52.0)
HEMOGLOBIN: 13.8 g/dL (ref 13.0–17.0)
MCH: 30.3 pg (ref 26.0–34.0)
MCHC: 33.4 g/dL (ref 30.0–36.0)
MCV: 90.6 fL (ref 78.0–100.0)
Platelets: 213 10*3/uL (ref 150–400)
RBC: 4.56 MIL/uL (ref 4.22–5.81)
RDW: 14.2 % (ref 11.5–15.5)
WBC: 4.7 10*3/uL (ref 4.0–10.5)

## 2013-11-15 LAB — BASIC METABOLIC PANEL
BUN: 19 mg/dL (ref 6–23)
CALCIUM: 9 mg/dL (ref 8.4–10.5)
CO2: 27 mEq/L (ref 19–32)
CREATININE: 1.02 mg/dL (ref 0.50–1.35)
Chloride: 101 mEq/L (ref 96–112)
GFR calc Af Amer: 79 mL/min — ABNORMAL LOW (ref >90)
GFR calc non Af Amer: 68 mL/min — ABNORMAL LOW (ref >90)
GLUCOSE: 102 mg/dL — AB (ref 70–99)
Potassium: 4.2 mEq/L (ref 3.7–5.3)
Sodium: 139 mEq/L (ref 137–147)

## 2013-11-15 NOTE — ED Notes (Signed)
C/o generalized weakness and feeling like he may pass out since yesterday.  Denies pain.

## 2013-11-16 ENCOUNTER — Observation Stay (HOSPITAL_COMMUNITY)
Admission: EM | Admit: 2013-11-16 | Discharge: 2013-11-17 | Disposition: A | Discharge status: Home / Self Care | Payer: Medicare Other | Attending: Internal Medicine | Admitting: Internal Medicine

## 2013-11-16 ENCOUNTER — Emergency Department (HOSPITAL_COMMUNITY): Payer: Medicare Other

## 2013-11-16 ENCOUNTER — Encounter (HOSPITAL_COMMUNITY): Payer: Self-pay | Admitting: *Deleted

## 2013-11-16 DIAGNOSIS — R5383 Other fatigue: Principal | ICD-10-CM

## 2013-11-16 DIAGNOSIS — I2510 Atherosclerotic heart disease of native coronary artery without angina pectoris: Secondary | ICD-10-CM | POA: Diagnosis present

## 2013-11-16 DIAGNOSIS — R200 Anesthesia of skin: Secondary | ICD-10-CM

## 2013-11-16 DIAGNOSIS — R42 Dizziness and giddiness: Secondary | ICD-10-CM

## 2013-11-16 DIAGNOSIS — I480 Paroxysmal atrial fibrillation: Secondary | ICD-10-CM | POA: Diagnosis present

## 2013-11-16 DIAGNOSIS — R55 Syncope and collapse: Secondary | ICD-10-CM | POA: Diagnosis present

## 2013-11-16 DIAGNOSIS — R531 Weakness: Secondary | ICD-10-CM | POA: Diagnosis present

## 2013-11-16 DIAGNOSIS — I10 Essential (primary) hypertension: Secondary | ICD-10-CM | POA: Diagnosis present

## 2013-11-16 DIAGNOSIS — I4891 Unspecified atrial fibrillation: Secondary | ICD-10-CM

## 2013-11-16 DIAGNOSIS — R5381 Other malaise: Secondary | ICD-10-CM

## 2013-11-16 LAB — URINE MICROSCOPIC-ADD ON

## 2013-11-16 LAB — URINALYSIS, ROUTINE W REFLEX MICROSCOPIC
BILIRUBIN URINE: NEGATIVE
GLUCOSE, UA: NEGATIVE mg/dL
HGB URINE DIPSTICK: NEGATIVE
Ketones, ur: NEGATIVE mg/dL
NITRITE: NEGATIVE
PH: 7 (ref 5.0–8.0)
Protein, ur: NEGATIVE mg/dL
SPECIFIC GRAVITY, URINE: 1.023 (ref 1.005–1.030)
Urobilinogen, UA: 1 mg/dL (ref 0.0–1.0)

## 2013-11-16 LAB — I-STAT TROPONIN, ED: Troponin i, poc: 0 ng/mL (ref 0.00–0.08)

## 2013-11-16 LAB — TROPONIN I: Troponin I: <0.3 ng/mL (ref <0.30)

## 2013-11-16 LAB — GLUCOSE, CAPILLARY: GLUCOSE-CAPILLARY: 83 mg/dL (ref 70–99)

## 2013-11-16 MED ORDER — ACETAMINOPHEN 650 MG RE SUPP: 650.0000 mg | Freq: Four times a day (QID) | RECTAL | Status: DC | PRN

## 2013-11-16 MED ORDER — ENOXAPARIN SODIUM 40 MG/0.4ML ~~LOC~~ SOLN
40.0000 mg | Freq: Every day | SUBCUTANEOUS | Status: DC
  Administered 2013-11-16 – 2013-11-17 (×2): 40 mg via SUBCUTANEOUS
  Filled 2013-11-16 (×2): qty 0.4

## 2013-11-16 MED ORDER — ONDANSETRON HCL 4 MG/2ML IJ SOLN: 4.0000 mg | Freq: Four times a day (QID) | INTRAMUSCULAR | Status: DC | PRN

## 2013-11-16 MED ORDER — FOLIC ACID 1 MG PO TABS
1.0000 mg | ORAL_TABLET | Freq: Every day | ORAL | Status: DC
  Administered 2013-11-16 – 2013-11-17 (×2): 1 mg via ORAL
  Filled 2013-11-16 (×2): qty 1

## 2013-11-16 MED ORDER — ACETAMINOPHEN 325 MG PO TABS: 650.0000 mg | ORAL_TABLET | Freq: Four times a day (QID) | ORAL | Status: DC | PRN

## 2013-11-16 MED ORDER — SODIUM CHLORIDE 0.9 % IV SOLN
INTRAVENOUS | Status: DC
  Administered 2013-11-16: 100 mL/h via INTRAVENOUS

## 2013-11-16 MED ORDER — CARVEDILOL 6.25 MG PO TABS
6.2500 mg | ORAL_TABLET | Freq: Two times a day (BID) | ORAL | Status: DC
  Administered 2013-11-16 (×2): 6.25 mg via ORAL
  Filled 2013-11-16 (×5): qty 1

## 2013-11-16 MED ORDER — LORAZEPAM 1 MG PO TABS
1.0000 mg | ORAL_TABLET | Freq: Two times a day (BID) | ORAL | Status: DC
  Administered 2013-11-16 – 2013-11-17 (×2): 1 mg via ORAL
  Filled 2013-11-16 (×2): qty 1

## 2013-11-16 MED ORDER — ATORVASTATIN CALCIUM 40 MG PO TABS
40.0000 mg | ORAL_TABLET | Freq: Every day | ORAL | Status: DC
  Administered 2013-11-16: 40 mg via ORAL
  Filled 2013-11-16 (×2): qty 1

## 2013-11-16 MED ORDER — SODIUM CHLORIDE 0.9 % IJ SOLN
3.0000 mL | Freq: Two times a day (BID) | INTRAMUSCULAR | Status: DC
  Administered 2013-11-16 – 2013-11-17 (×3): 3 mL via INTRAVENOUS

## 2013-11-16 MED ORDER — TAMSULOSIN HCL 0.4 MG PO CAPS
0.4000 mg | ORAL_CAPSULE | Freq: Every day | ORAL | Status: DC
  Administered 2013-11-16 – 2013-11-17 (×2): 0.4 mg via ORAL
  Filled 2013-11-16 (×2): qty 1

## 2013-11-16 MED ORDER — LISINOPRIL 5 MG PO TABS
5.0000 mg | ORAL_TABLET | Freq: Two times a day (BID) | ORAL | Status: DC
  Administered 2013-11-16: 5 mg via ORAL
  Filled 2013-11-16 (×4): qty 1

## 2013-11-16 MED ORDER — ONDANSETRON HCL 4 MG PO TABS: 4.0000 mg | ORAL_TABLET | Freq: Four times a day (QID) | ORAL | Status: DC | PRN

## 2013-11-16 MED ORDER — ONDANSETRON HCL 4 MG/2ML IJ SOLN: 4.0000 mg | Freq: Three times a day (TID) | INTRAMUSCULAR | Status: DC | PRN

## 2013-11-16 MED ORDER — ASPIRIN EC 81 MG PO TBEC
81.0000 mg | DELAYED_RELEASE_TABLET | Freq: Every day | ORAL | Status: DC
  Administered 2013-11-16 – 2013-11-17 (×2): 81 mg via ORAL
  Filled 2013-11-16 (×2): qty 1

## 2013-11-16 MED ORDER — NITROGLYCERIN 0.4 MG SL SUBL: 0.4000 mg | SUBLINGUAL_TABLET | SUBLINGUAL | Status: DC | PRN

## 2013-11-16 NOTE — ED Notes (Signed)
No changes, pt alert, NAD, calm, interactive, to CT.

## 2013-11-16 NOTE — H&P (Signed)
[facility] History and Physical  Patient: Alexander Farley  QAS:[id number]  DOB: May 21, 1933  DOS: the patient was seen and examined on 11/16/2013 PCP: Simona Huh, MD  Chief Complaint: Dizziness  HPI: Alexander Farley is a 80 y.o. male with Past medical history of coronary artery disease status post CABG and PCI, intermittent atrial fibrillation, hypertension, erectile dysfunction, dyslipidemia. The patient is coming from home. The patient presented with complaints of dizziness that has been ongoing since last one day. He mentions that this dizziness is a sensation that comes in waves. He feels generalized weakness that is going all over his body which is associated with numbness. It lasts for a few seconds and then it gradually improves. He denies any episodes of passing out or unconsciousness but he also time feels that he is, pass out. With this episode he feels that he cannot walk or move. He has occasional chest tightness with this episode as well. He denies any fever, chills, shortness of breath, palpitation, nausea, vomiting, abdominal pain, diarrhea, constipation, active bleeding, burning urination. In between the episodes he denies any active complaint of focal neurological deficit. He has been on the same medication since last 2 months. His Coreg was decreased from 12.5-6.25 twice a day due to episodes of orthostatic hypotension. It due to his elevated LDL his Crestor was supplemented by zetia.  Review of Systems: as mentioned in the history of present illness.  A Comprehensive review of the other systems is negative.  Past Medical History  Diagnosis Date  . Coronary artery disease   . Intermittent atrial fibrillation   . Dyslipidemia   . Hypertension   . Anxiety   . ED (erectile dysfunction)   . RBBB plus LA hemiblock   . Palpitations   . Back pain    Past Surgical History  Procedure Laterality Date  . Cardiac catheterization  10/12/2009    NORMAL LEFT VENTRICULAR  SYSTOLIC FUNCTION. EF 60-65%  . Coronary angioplasty with stent placement    . Coronary artery bypass graft      FEb. 2011  . Eye surgery  02-07-11    Right Eye   Social History:  reports that he quit smoking about 28 years ago. He does not have any smokeless tobacco history on file. He reports that he does not drink alcohol or use illicit drugs. Independent for most of his  ADL.  Allergies  Allergen Reactions  . Zocor [Simvastatin]     Family History  Problem Relation Age of Onset  . Stroke Mother   . Stroke Father     Prior to Admission medications   Medication Sig Start Date End Date Taking? Authorizing Provider  aspirin 81 MG tablet Take 81 mg by mouth daily.      Historical Provider, MD  carvedilol (COREG) 12.5 MG tablet Take 6.25 mg by mouth 2 (two) times daily with a meal. 08/13/12   Thayer Headings, MD  folic acid (FOLVITE) 1 MG tablet Take 1 mg by mouth daily.      Historical Provider, MD  hydrochlorothiazide (HYDRODIURIL) 25 MG tablet TAKE 1 TABLET (25 MG TOTAL) BY MOUTH DAILY. 08/20/13   Thayer Headings, MD  hydrochlorothiazide (HYDRODIURIL) 25 MG tablet TAKE 1 TABLET (25 MG TOTAL) BY MOUTH DAILY. 08/21/13   Thayer Headings, MD  KLOR-CON M10 10 MEQ tablet TAKE 1 TABLET (10 MEQ TOTAL) BY MOUTH DAILY. 09/19/13   Thayer Headings, MD  lisinopril (PRINIVIL,ZESTRIL) 5 MG tablet TAKE 1 TABLET (5  MG TOTAL) BY MOUTH 2 (TWO) TIMES DAILY. 08/21/13   Thayer Headings, MD  LORazepam (ATIVAN) 1 MG tablet Take 1 mg by mouth 2 (two) times daily.      Historical Provider, MD  Multiple Vitamin (MULTIVITAMIN) capsule Take 1 capsule by mouth daily.      Historical Provider, MD  Naproxen Sodium (ALEVE PO) Take 200 mg by mouth as needed.     Historical Provider, MD  nitroGLYCERIN (NITROSTAT) 0.4 MG SL tablet Place 0.4 mg under the tongue every 5 (five) minutes as needed.      Historical Provider, MD  rosuvastatin (CRESTOR) 20 MG tablet Take 1 tablet (20 mg total) by mouth daily. 11/01/11   Thayer Headings, MD  Tamsulosin HCl (FLOMAX) 0.4 MG CAPS Take 0.4 mg by mouth daily.      Historical Provider, MD  vitamin B-12 (CYANOCOBALAMIN) 50 MCG tablet Take 50 mcg by mouth 2 (two) times daily.     Historical Provider, MD  Wheat Dextrin (BENEFIBER PO) Take by mouth as needed.      Historical Provider, MD    Physical Exam: Filed Vitals:   11/16/13 0328 11/16/13 0530 11/16/13 0545 11/16/13 0600  BP: 151/91 136/79 130/73 127/70  Pulse: 73 65 72 64  Temp:      TempSrc:      Resp: 35 16 11 12   SpO2: 100% 99% 95% 98%    General: Alert, Awake and Oriented to Time, Place and Person. Appear in mild distress Eyes: PERRL ENT: Oral Mucosa clear moist. Neck: No JVD Cardiovascular: S1 and S2 Present, no Murmur, Peripheral Pulses Present Respiratory: Bilateral Air entry equal and Decreased, Clear to Auscultation,  No Crackles, no wheezes Abdomen: Bowel Sound Present, Soft and Non tender Skin: No Rash Extremities: No Pedal edema, no calf tenderness Neurologic: Mental status alert awake and oriented normal speech, Cranial Nerves pupils are reactive extraocular muscle movement intact gag reflex present no nystagmus noted, Motor strength bilaterally equal strength upper and lower extremity 5 x 5, Sensation present to light touch withdraws to pain, reflexes bilaterally present, babinski negative, Proprioception normal, Cerebellar test normal finger-nose-finger.  Labs on Admission:  CBC:  Recent Labs Lab 11/15/13 2230  WBC 4.7  HGB 13.8  HCT 41.3  MCV 90.6  PLT 213    CMP     Component Value Date/Time   NA 139 11/15/2013 2230   K 4.2 11/15/2013 2230   CL 101 11/15/2013 2230   CO2 27 11/15/2013 2230   GLUCOSE 102* 11/15/2013 2230   BUN 19 11/15/2013 2230   CREATININE 1.02 11/15/2013 2230   CALCIUM 9.0 11/15/2013 2230   PROT 7.1 10/16/2013 1006   ALBUMIN 3.8 10/16/2013 1006   AST 32 10/16/2013 1006   ALT 36 10/16/2013 1006   ALKPHOS 51 10/16/2013 1006   BILITOT 0.5 10/16/2013 1006   GFRNONAA 68*  11/15/2013 2230   GFRAA 79* 11/15/2013 2230    No results found for this basename: LIPASE, AMYLASE,  in the last 168 hours No results found for this basename: AMMONIA,  in the last 168 hours  No results found for this basename: CKTOTAL, CKMB, CKMBINDEX, TROPONINI,  in the last 168 hours BNP (last 3 results) No results found for this basename: PROBNP,  in the last 8760 hours  Radiological Exams on Admission: Ct Head Wo Contrast  11/16/2013   CLINICAL DATA:  Dizziness.  Unsteady gait.  Headache yesterday.  EXAM: CT HEAD WITHOUT CONTRAST  TECHNIQUE: Contiguous axial images were  obtained from the base of the skull through the vertex without intravenous contrast.  COMPARISON:  02/01/2011.  FINDINGS: Ventricular system normal in size and appearance for age. Mild age related cortical atrophy. Severe changes of small vessel disease of the white matter diffusely, particularly the corona radiata of both cerebral hemispheres. No mass lesion. No midline shift. No acute hemorrhage or hematoma. No extra-axial fluid collections. No evidence of acute infarction.  Large mucous retention cyst or polyp in the left maxillary sinus. Remaining visualized paranasal sinuses, bilateral mastoid air cells, and bilateral middle ear cavities well aerated. No skull fracture or other focal osseous abnormality involving the skull. Bilateral carotid siphon and vertebral artery atherosclerosis.  IMPRESSION: 1. No acute intracranial abnormality. 2. Stable mild cortical atrophy and severe chronic microvascular ischemic changes of the white matter. 3. Chronic left maxillary sinusitis.   Electronically Signed   By: Evangeline Dakin M.D.   On: 11/16/2013 04:19    EKG: Independently reviewed. normal EKG, normal sinus rhythm, occasional PVC noted, unifocal.  Assessment/Plan Principal Problem:   Near syncope Active Problems:   CAD (coronary artery disease)   HTN (hypertension)   Generalized weakness   PAF (paroxysmal atrial  fibrillation)   1. Near syncope The patient is presenting with multiple episodes of near syncope. He had elevated last event here in the hospital as well as per the ED physician. On my review of the telemetry I couldn't find any evidence of arrhythmia as needed in initial troponin and EKG does not show any new findings. A CT of the head does not show any acute abnormality. I have checked orthostatic both in supine and sitting position and there is no drop PD at With this the patient will be admitted to the hospital since he is at high risk for fall. PT will be consulted, serial telemetry monitoring and troponin will be checked. I would of been limited echocardiogram. Serial neuro checks will be obtained as well. At present I would hold the patient's hydrochlorothiazide. Most likely this presentation is secondary to cardiovascular etiology probably orthostasis. Patient does not appear to have any seizure or other CNS cause as his current presentation. Continue neurochecks.  2. Hypertension Continue patient's list of the medications including lisinopril and Coreg  3. Coronary artery disease Continue aspirin  4. History of intermittent atrial fibrillation At present patient has normal sinus rhythm with right bundle branch block Continue to monitor on telemetry  DVT Prophylaxis: subcutaneous Heparin Nutrition: Cardiac diet  Code Status: Full  Family Communication: Wife was present at bedside, opportunity was given to ask question and all questions were answered satisfactorily at the time of interview. Disposition: Admitted to observation in telemetry unit.  Author: Berle Mull, MD [facility] Pager: [phone] 11/16/2013, 6:24 AM    If 7PM-7AM, please contact night-coverage [email or web address] Password [email or web address]

## 2013-11-16 NOTE — ED Notes (Signed)
Dr Miller at Bedside.

## 2013-11-16 NOTE — ED Provider Notes (Signed)
CSN: [id number]     Arrival date & time 11/15/13  2201 History   First MD Initiated Contact with Patient 11/16/13 0251     Chief Complaint  Patient presents with  . Weakness  . Near Syncope     (Consider location/radiation/quality/duration/timing/severity/associated sxs/prior Treatment) HPI Comments: Alexander Farley is a very pleasant 80 year old male with a history of coronary disease status post stenting and then coronary artery bypass grafting 3 years ago. He has also had intermittent atrial fibrillation and bifascicular block. He presents to the hospital after having multiple (greater than 20 episodes) episodes of near syncope today. He states that he feels like a wave of weakness goes over his body during which time he feels a chest tightness which is mild. Sometimes he has to chest tightness when he is not having these symptoms as well. When he has this feeling of generalized weakness he is unable to walk because he feels like he will pass out, it will happen when he is standing, sitting or laying down. It is not positional, it is not related with any focal neurologic symptoms such as changes in vision, difficulty speaking or focal weakness or numbness. Nothing seems to make it come on, it will last for several minutes and sometimes even longer. His spouse states that when she saw him this evening he looked ill, she brought him to the hospital for evaluation. He denies palpitations, denies abdominal pain, back pain, dysuria, diarrhea and has had a normal appetite. He is not coughing, has no headache, has no sore throat or any other complaints. He did require assistance getting to the car this evening which is very unusual for him. 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  Patient is a 80 y.o. male presenting with weakness and near-syncope. The history is provided by the patient and the spouse.  Weakness  Near Syncope    Past Medical History  Diagnosis Date  . Coronary artery  disease   . Intermittent atrial fibrillation   . Dyslipidemia   . Hypertension   . Anxiety   . ED (erectile dysfunction)   . RBBB plus LA hemiblock   . Palpitations   . Back pain    Past Surgical History  Procedure Laterality Date  . Cardiac catheterization  10/12/2009    NORMAL LEFT VENTRICULAR SYSTOLIC FUNCTION. EF 60-65%  . Coronary angioplasty with stent placement    . Coronary artery bypass graft      FEb. 2011  . Eye surgery  02-07-11    Right Eye   Family History  Problem Relation Age of Onset  . Stroke Mother   . Stroke Father    History  Substance Use Topics  . Smoking status: Former Smoker    Quit date: 01/30/1985  . Smokeless tobacco: Not on file  . Alcohol Use: No    Review of Systems  Cardiovascular: Positive for near-syncope.  Neurological: Positive for weakness.  All other systems reviewed and are negative.      Allergies  Zocor  Home Medications   No current outpatient prescriptions on file. BP 148/84  Pulse 61  Temp(Src) 98.7 F (37.1 C) (Oral)  Resp 20  SpO2 97% Physical Exam  Nursing note and vitals reviewed. Constitutional: He appears well-developed and well-nourished. No distress.  HENT:  Head: Normocephalic and atraumatic.  Mouth/Throat: Oropharynx is clear and moist. No oropharyngeal exudate.  Eyes: Conjunctivae and EOM are normal. Pupils are equal, round, and reactive to light. Right eye exhibits no  discharge. Left eye exhibits no discharge. No scleral icterus.  Neck: Normal range of motion. Neck supple. No JVD present. No thyromegaly present.  Cardiovascular: Normal rate, regular rhythm, normal heart sounds and intact distal pulses.  Exam reveals no gallop and no friction rub.   No murmur heard. Pulmonary/Chest: Effort normal and breath sounds normal. No respiratory distress. He has no wheezes. He has no rales.  Abdominal: Soft. Bowel sounds are normal. He exhibits no distension and no mass. There is no tenderness.   Musculoskeletal: Normal range of motion. He exhibits no edema and no tenderness.  Lymphadenopathy:    He has no cervical adenopathy.  Neurological: He is alert. Coordination normal.  Speech is clear, cranial nerves III through XII are intact, memory is intact, strength is normal in all 4 extremities including grips, sensation is intact to light touch and pinprick in all 4 extremities. Coordination as tested by finger-nose-finger is normal, no limb ataxia. Normal gait, normal reflexes at the patellar tendons bilaterally  Skin: Skin is warm and dry. No rash noted. No erythema.  Psychiatric: He has a normal mood and affect. His behavior is normal.    ED Course  Procedures (including critical care time) Labs Review Labs Reviewed  BASIC METABOLIC PANEL - Abnormal; Notable for the following:    Glucose, Bld 102 (*)    GFR calc non Af Amer 68 (*)    GFR calc Af Amer 79 (*)    All other components within normal limits  URINALYSIS, ROUTINE W REFLEX MICROSCOPIC - Abnormal; Notable for the following:    APPearance CLOUDY (*)    Leukocytes, UA TRACE (*)    All other components within normal limits  CBC  URINE MICROSCOPIC-ADD ON  TROPONIN I  TROPONIN I  TROPONIN I  I-STAT TROPOININ, ED  CBG MONITORING, ED   Imaging Review Ct Head Wo Contrast  11/16/2013   CLINICAL DATA:  Dizziness.  Unsteady gait.  Headache yesterday.  EXAM: CT HEAD WITHOUT CONTRAST  TECHNIQUE: Contiguous axial images were obtained from the base of the skull through the vertex without intravenous contrast.  COMPARISON:  02/01/2011.  FINDINGS: Ventricular system normal in size and appearance for age. Mild age related cortical atrophy. Severe changes of small vessel disease of the white matter diffusely, particularly the corona radiata of both cerebral hemispheres. No mass lesion. No midline shift. No acute hemorrhage or hematoma. No extra-axial fluid collections. No evidence of acute infarction.  Large mucous retention cyst or  polyp in the left maxillary sinus. Remaining visualized paranasal sinuses, bilateral mastoid air cells, and bilateral middle ear cavities well aerated. No skull fracture or other focal osseous abnormality involving the skull. Bilateral carotid siphon and vertebral artery atherosclerosis.  IMPRESSION: 1. No acute intracranial abnormality. 2. Stable mild cortical atrophy and severe chronic microvascular ischemic changes of the white matter. 3. Chronic left maxillary sinusitis.   Electronically Signed   By: Evangeline Dakin M.D.   On: 11/16/2013 04:19     EKG Interpretation   Date/Time:  Friday November 15 2013 22:06:05 EDT Ventricular Rate:  67 PR Interval:  176 QRS Duration: 138 QT Interval:  446 QTC Calculation: 471 R Axis:   -71 Text Interpretation:  Sinus rhythm with Premature atrial complexes Right  bundle branch block Left anterior fascicular block Abnormal ECG since last  tracing no significant change Reconfirmed by Marie Chow  MD, Tanasia Budzinski ([id number]) on  11/16/2013 3:28:51 AM      MDM   Final diagnoses:  Weakness  The patient is having recurrent episodes of severe generalized weakness. He feels as if he is getting very lightheaded and becoming very nearly syncopal. He has not had a syncopal event, he cannot describe the lightheadedness very well, he is unsure if he is having vertigo but it is not inducible on his exam. His vital signs show mild hypertension but no other significant finding.  He will need further evaluation with cardiac monitoring, I suspect this could be cardiac in etiology, labs thus far are unremarkable.  There has been no arrhythmias seen on the monitor, labs and CT scan did not show specific etiology, care was discussed with the hospitalist who will admit to telemetry bed for cardiac monitoring and observation. The patient is in agreement with the plan   Johnna Acosta, MD 11/16/13 [phone]

## 2013-11-16 NOTE — Progress Notes (Signed)
Patient Demographics  Alexander Farley, is a 80 y.o. male, DOB - [birth year], LFY:[id number]  Admit date - 11/16/2013   Admitting Physician Berle Mull, MD  Outpatient Primary MD for the patient is Simona Huh, MD  LOS - 0   Chief Complaint  Patient presents with  . Weakness  . Near Syncope        Assessment & Plan    1. Near syncope - never lost consciousness but sensation of being weak and unable to move during these episodes, head CT unremarkable, check orthostatics, check echogram and carotid duplex. If all negative we will consider EEG. Continue monitoring neuro checks and telemetry monitoring. Will have him evaluated by PT.      2. Hypertension - Continue patient's list of the medications including lisinopril and Coreg     3. Coronary artery disease - chest pain-free, EKG nonacute with right bundle branch block, continue aspirin and Coreg for secondary prevention. No acute issues.    4. History of intermittent atrial fibrillation - At present patient has normal sinus rhythm with right bundle branch block , Continue to monitor on telemetry, continue on Coreg.    5. BPH on Flomax.    6. Dyslipidemia home dose statin continued.     Code Status: Full  Family Communication:   Disposition Plan: Home   Procedures CT Head, Echogram, carotid duplex   Consults     Medications  Scheduled Meds: . aspirin EC  81 mg Oral Daily  . atorvastatin  40 mg Oral q1800  . carvedilol  6.25 mg Oral BID WC  . enoxaparin (LOVENOX) injection  40 mg Subcutaneous Daily  . folic acid  1 mg Oral Daily  . lisinopril  5 mg Oral BID  . LORazepam  1 mg Oral BID  . sodium chloride  3 mL Intravenous Q12H  . tamsulosin  0.4 mg Oral Daily   Continuous Infusions:  PRN Meds:.acetaminophen,  acetaminophen, nitroGLYCERIN, ondansetron (ZOFRAN) IV, ondansetron  DVT Prophylaxis   Heparin   Lab Results  Component Value Date   PLT 213 11/15/2013    Antibiotics     Anti-infectives   None          Subjective:   Alexander Farley today has, No headache, No chest pain, No abdominal pain - No Nausea, No new weakness tingling or numbness, No Cough - SOB.    Objective:   Filed Vitals:   11/16/13 0630 11/16/13 0700 11/16/13 0715 11/16/13 0751  BP: 138/82 150/79 148/84 149/87  Pulse: 71 67 61 73  Temp:    97.8 F (36.6 C)  TempSrc:    Oral  Resp: 20 20 20 18   Height:    5\' 9"  (1.753 m)  Weight:    75.932 kg (167 lb 6.4 oz)  SpO2: 100% 100% 97% 100%    Wt Readings from Last 3 Encounters:  11/16/13 75.932 kg (167 lb 6.4 oz)  07/15/13 75.206 kg (165 lb 12.8 oz)  01/25/13 73.211 kg (161 lb 6.4 oz)     Intake/Output Summary (Last 24 hours) at 11/16/13 1022 Last data filed at 11/16/13 0408  Gross per 24 hour  Intake      0 ml  Output    300 ml  Net   -  300 ml     Physical Exam  Awake Alert, Oriented X 3, No new F.N deficits, Normal affect [place].AT,PERRAL Supple Neck,No JVD, No cervical lymphadenopathy appriciated.  Symmetrical Chest wall movement, Good air movement bilaterally, CTAB RRR,No Gallops,Rubs or new Murmurs, No Parasternal Heave +ve B.Sounds, Abd Soft, Non tender, No organomegaly appriciated, No rebound - guarding or rigidity. No Cyanosis, Clubbing or edema, No new Rash or bruise      Data Review   Micro Results No results found for this or any previous visit (from the past 240 hour(s)).  Radiology Reports Ct Head Wo Contrast  11/16/2013   CLINICAL DATA:  Dizziness.  Unsteady gait.  Headache yesterday.  EXAM: CT HEAD WITHOUT CONTRAST  TECHNIQUE: Contiguous axial images were obtained from the base of the skull through the vertex without intravenous contrast.  COMPARISON:  02/01/2011.  FINDINGS: Ventricular system normal in size and appearance for age.  Mild age related cortical atrophy. Severe changes of Farley vessel disease of the white matter diffusely, particularly the corona radiata of both cerebral hemispheres. No mass lesion. No midline shift. No acute hemorrhage or hematoma. No extra-axial fluid collections. No evidence of acute infarction.  Large mucous retention cyst or polyp in the left maxillary sinus. Remaining visualized paranasal sinuses, bilateral mastoid air cells, and bilateral middle ear cavities well aerated. No skull fracture or other focal osseous abnormality involving the skull. Bilateral carotid siphon and vertebral artery atherosclerosis.  IMPRESSION: 1. No acute intracranial abnormality. 2. Stable mild cortical atrophy and severe chronic microvascular ischemic changes of the white matter. 3. Chronic left maxillary sinusitis.   Electronically Signed   By: Evangeline Dakin M.D.   On: 11/16/2013 04:19    CBC  Recent Labs Lab 11/15/13 2230  WBC 4.7  HGB 13.8  HCT 41.3  PLT 213  MCV 90.6  MCH 30.3  MCHC 33.4  RDW 14.2    Chemistries   Recent Labs Lab 11/15/13 2230  NA 139  K 4.2  CL 101  CO2 27  GLUCOSE 102*  BUN 19  CREATININE 1.02  CALCIUM 9.0   ------------------------------------------------------------------------------------------------------------------ estimated creatinine clearance is 59.7 ml/min (by C-G formula based on Cr of 1.02). ------------------------------------------------------------------------------------------------------------------ No results found for this basename: HGBA1C,  in the last 72 hours ------------------------------------------------------------------------------------------------------------------ No results found for this basename: CHOL, HDL, LDLCALC, TRIG, CHOLHDL, LDLDIRECT,  in the last 72 hours ------------------------------------------------------------------------------------------------------------------ No results found for this basename: TSH, T4TOTAL, FREET3,  T3FREE, THYROIDAB,  in the last 72 hours ------------------------------------------------------------------------------------------------------------------ No results found for this basename: VITAMINB12, FOLATE, FERRITIN, TIBC, IRON, RETICCTPCT,  in the last 72 hours  Coagulation profile No results found for this basename: INR, PROTIME,  in the last 168 hours  No results found for this basename: DDIMER,  in the last 72 hours  Cardiac Enzymes No results found for this basename: CK, CKMB, TROPONINI, MYOGLOBIN,  in the last 168 hours ------------------------------------------------------------------------------------------------------------------ No components found with this basename: POCBNP,      Time Spent in minutes   35   Rogue Pautler K M.D on 11/16/2013 at 10:22 AM  Between 7am to 7pm - Pager - [phone]  After 7pm go to [email or web address] - password [email or web address]  And look for the night coverage person covering for me after hours  [facility] Office  [phone]

## 2013-11-16 NOTE — Evaluation (Signed)
Physical Therapy Evaluation Patient Details Name: TREA LATNER MRN: [id number] DOB: [birth year] Today's Date: 11/16/2013 Time: 2355-7322 PT Time Calculation (min): 17 min  PT Assessment / Plan / Recommendation History of Present Illness  pt admitted after syncopla episode, workup in progress.   Clinical Impression  Patient presents fairly independent for all mobility.  Patient did not require any physical assistance.  Recommend supervision for OOB activity due to recent syncopal episodes.  No loss of balance and no apparent balance deficits noted.  No further PT needs identified.  Of note, BP's taken increased from lying to sitting to standing.  See below for details.    PT Assessment  Patent does not need any further PT services    Follow Up Recommendations  No PT follow up    Does the patient have the potential to tolerate intense rehabilitation      Barriers to Discharge        Equipment Recommendations  None recommended by PT    Recommendations for Other Services     Frequency      Precautions / Restrictions Precautions Precautions: None Restrictions Weight Bearing Restrictions: No   Pertinent Vitals/Pain Orthostatic BPs  Supine 111/62     Sitting after 1 min 115/75  Standing after 1 min 122/88  Standing after ambulation 145/89        Mobility  Bed Mobility Overal bed mobility: Independent Transfers Overall transfer level: Independent Ambulation/Gait Ambulation/Gait assistance: Supervision (for safety secondary syncopal episodes) Ambulation Distance (Feet): 150 Feet Assistive device: None Gait Pattern/deviations: WFL(Within Functional Limits) Gait velocity: decreased    Exercises     PT Diagnosis:    PT Problem List:   PT Treatment Interventions:       PT Goals(Current goals can be found in the care plan section) Acute Rehab PT Goals PT Goal Formulation: No goals set, d/c therapy  Visit Information  Last PT Received On: 11/16/13 Assistance  Needed: +1 History of Present Illness: pt admitted after syncopla episode, workup in progress.        Prior [place] expects to be discharged to:: Private residence Living Arrangements: Spouse/significant other Available Help at Discharge: Family Type of Home: House Home Access: Stairs to enter Technical brewer of Steps: 2 Entrance Stairs-Rails: None Home Layout: One level Home Equipment: None Prior Function Level of Independence: Independent Communication Communication: No difficulties    Cognition  Cognition Arousal/Alertness: Awake/alert Behavior During Therapy: WFL for tasks assessed/performed Overall Cognitive Status: Within Functional Limits for tasks assessed    Extremity/Trunk Assessment Upper Extremity Assessment Upper Extremity Assessment: Overall WFL for tasks assessed Lower Extremity Assessment Lower Extremity Assessment: Overall WFL for tasks assessed   Balance Balance Overall balance assessment: Independent Sitting-balance support: No upper extremity supported Sitting balance-Leahy Scale: Normal Standing balance support: No upper extremity supported Standing balance-Leahy Scale: Normal High level balance activites: Sudden stops;Direction changes High Level Balance Comments: no loss of balance  End of Session PT - End of Session Equipment Utilized During Treatment: Gait belt Activity Tolerance: Patient tolerated treatment well Patient left: in bed;with family/visitor present;with call bell/phone within reach Nurse Communication: Mobility status  GP Functional Assessment Tool Used: clinical judgement Functional Limitation: Mobility: Walking and moving around Mobility: Walking and Moving Around Current Status (G2542): At least 1 percent but less than 20 percent impaired, limited or restricted Mobility: Walking and Moving Around Goal Status [phone]): At least 1 percent but less than 20 percent impaired, limited or  restricted Mobility: Walking and  Moving Around Discharge Status [phone]): At least 1 percent but less than 20 percent impaired, limited or restricted   Shanna Cisco , [place]  11/16/2013, 2:57 PM

## 2013-11-16 NOTE — ED Notes (Signed)
Pt alert, NAD, calm, interactive, skin W&D, resps e/u, speaking in clear complete sentences, c/o general weakness (denies: sob, nausea, dizziness or pain), MAEx4, PERRL 44mm, no droop or drift, stregth and coordination are equal and strong, wife at [facility].

## 2013-11-16 NOTE — Progress Notes (Signed)
Utilization Review Completed.Shonda Mandarino T3/21/2015  

## 2013-11-16 NOTE — ED Notes (Signed)
Pt reports an episode of general body numbness, started, "severe and in seconds was less, but still felt it", no obvious sx or changes on monitor, VSS, denies visual changes or HA.

## 2013-11-16 NOTE — ED Notes (Signed)
Patient transported to CT 

## 2013-11-16 NOTE — ED Notes (Signed)
Dr. Miller at [facility].  

## 2013-11-16 NOTE — ED Notes (Signed)
Dr. Posey Pronto (admitting IM) at [facility]. No changes, alert, NAD, calm, interactive, participatory. Wife at [facility].

## 2013-11-17 LAB — GLUCOSE, CAPILLARY
GLUCOSE-CAPILLARY: 93 mg/dL (ref 70–99)
Glucose-Capillary: 89 mg/dL (ref 70–99)

## 2013-11-17 MED ORDER — CARVEDILOL 3.125 MG PO TABS
3.1250 mg | ORAL_TABLET | Freq: Two times a day (BID) | ORAL | Status: DC
  Administered 2013-11-17: 3.125 mg via ORAL
  Filled 2013-11-17 (×3): qty 1

## 2013-11-17 MED ORDER — SODIUM CHLORIDE 0.9 % IV SOLN
INTRAVENOUS | Status: AC
  Administered 2013-11-17: 07:00:00 via INTRAVENOUS

## 2013-11-17 MED ORDER — LISINOPRIL 5 MG PO TABS: 5.0000 mg | ORAL_TABLET | Freq: Two times a day (BID) | ORAL | Status: DC

## 2013-11-17 NOTE — Discharge Instructions (Signed)
Follow with Primary MD Simona Huh, MD in 7 days   Get CBC, CMP, checked 7 days by Primary MD and again as instructed by your Primary MD. Get a 2 view Chest X ray done next visit if you had Pneumonia of Lung problems at the Hospital.   Activity: As tolerated with Full fall precautions use walker/cane & assistance as needed  You must sit at a stationary position for 5 minutes and do leg extension exercises as taught for 5 minutes before you start walking from a resting position or after getting up from a bed, once you stand up , stand at that spot for 3-5 minutes while holding on to a wall-bed-heavy furniture and then walk only if you are not dizzy, using a  walker at all times, if you still get dizzy sit down, and call for help.   Disposition Home     Diet: Heart Healthy    For Heart failure patients - Check your Weight same time everyday, if you gain over 2 pounds, or you develop in leg swelling, experience more shortness of breath or chest pain, call your Primary MD immediately. Follow Cardiac Low Salt Diet and 1.8 lit/day fluid restriction.   On your next visit with her primary care physician please Get Medicines reviewed and adjusted.  Please request your Prim.MD to go over all Hospital Tests and Procedure/Radiological results at the follow up, please get all Hospital records sent to your Prim MD by signing hospital release before you go home.   If you experience worsening of your admission symptoms, develop shortness of breath, life threatening emergency, suicidal or homicidal thoughts you must seek medical attention immediately by calling 911 or calling your MD immediately  if symptoms less severe.  You Must read complete instructions/literature along with all the possible adverse reactions/side effects for all the Medicines you take and that have been prescribed to you. Take any new Medicines after you have completely understood and accpet all the possible adverse reactions/side  effects.   Do not drive and provide baby sitting services if your were admitted for syncope or siezures until you have seen by Primary MD or a Neurologist and advised to do so again.  Do not drive when taking Pain medications.    Do not take more than prescribed Pain, Sleep and Anxiety Medications  Special Instructions: If you have smoked or chewed Tobacco  in the last 2 yrs please stop smoking, stop any regular Alcohol  and or any Recreational drug use.  Wear Seat belts while driving.   Please note  You were cared for by a hospitalist during your hospital stay. If you have any questions about your discharge medications or the care you received while you were in the hospital after you are discharged, you can call the unit and asked to speak with the hospitalist on call if the hospitalist that took care of you is not available. Once you are discharged, your primary care physician will handle any further medical issues. Please note that NO REFILLS for any discharge medications will be authorized once you are discharged, as it is imperative that you return to your primary care physician (or establish a relationship with a primary care physician if you do not have one) for your aftercare needs so that they can reassess your need for medications and monitor your lab values.

## 2013-11-17 NOTE — Discharge Summary (Signed)
Alexander Farley, is a 80 y.o. male  DOB 1933/06/03  MRN [id number].  Admission date:  11/16/2013  Admitting Physician  Berle Mull, MD  Discharge Date:  11/17/2013   Primary MD  Simona Huh, MD  Recommendations for primary care physician for things to follow:   Monitor orthostatics and blood pressure closely   Admission Diagnosis  Weakness [780.79]   Discharge Diagnosis  Weakness [780.79] from orthostatic hypotension  Principal Problem:   Near syncope Active Problems:   CAD (coronary artery disease)   HTN (hypertension)   Generalized weakness   PAF (paroxysmal atrial fibrillation)      Past Medical History  Diagnosis Date  . Coronary artery disease   . Intermittent atrial fibrillation   . Dyslipidemia   . Hypertension   . Anxiety   . ED (erectile dysfunction)   . RBBB plus LA hemiblock   . Palpitations   . Back pain     Past Surgical History  Procedure Laterality Date  . Cardiac catheterization  10/12/2009    NORMAL LEFT VENTRICULAR SYSTOLIC FUNCTION. EF 60-65%  . Coronary angioplasty with stent placement    . Coronary artery bypass graft      FEb. 2011  . Eye surgery  02-07-11    Right Eye     Discharge Condition: stable   Follow UP  Follow-up Information   Follow up with Simona Huh, MD. Schedule an appointment as soon as possible for a visit in 1 week.   Specialty:  Family Medicine   Contact information:   [address], [place] [address] [phone]       Follow up with Simona Huh, MD.   Specialty:  Family Medicine   Contact information:   [address], [place] [address] [phone]         Discharge Instructions  and  Discharge Medications      Discharge Orders   Future Orders Complete By Expires   Diet - low sodium heart  healthy  As directed    Discharge instructions  As directed    Comments:     Follow with Primary MD Simona Huh, MD in 7 days   Get CBC, CMP, checked 7 days by Primary MD and again as instructed by your Primary MD. Get a 2 view Chest X ray done next visit if you had Pneumonia of Lung problems at the Hospital.   Activity: As tolerated with Full fall precautions use walker/cane & assistance as needed  You must sit at a stationary position for 5 minutes and do leg extension exercises as taught for 5 minutes before you start walking from a resting position or after getting up from a bed, once you stand up , stand at that spot for 3-5 minutes while holding on to a wall-bed-heavy furniture and then walk only if you are not dizzy, using a  walker at all times, if you still get dizzy sit down, and call for help.   Disposition Home  Diet: Heart Healthy    For Heart failure patients - Check your Weight same time everyday, if you gain over 2 pounds, or you develop in leg swelling, experience more shortness of breath or chest pain, call your Primary MD immediately. Follow Cardiac Low Salt Diet and 1.8 lit/day fluid restriction.   On your next visit with her primary care physician please Get Medicines reviewed and adjusted.  Please request your Prim.MD to go over all Hospital Tests and Procedure/Radiological results at the follow up, please get all Hospital records sent to your Prim MD by signing hospital release before you go home.   If you experience worsening of your admission symptoms, develop shortness of breath, life threatening emergency, suicidal or homicidal thoughts you must seek medical attention immediately by calling 911 or calling your MD immediately  if symptoms less severe.  You Must read complete instructions/literature along with all the possible adverse reactions/side effects for all the Medicines you take and that have been prescribed to you. Take any new Medicines after  you have completely understood and accpet all the possible adverse reactions/side effects.   Do not drive and provide baby sitting services if your were admitted for syncope or siezures until you have seen by Primary MD or a Neurologist and advised to do so again.  Do not drive when taking Pain medications.    Do not take more than prescribed Pain, Sleep and Anxiety Medications  Special Instructions: If you have smoked or chewed Tobacco  in the last 2 yrs please stop smoking, stop any regular Alcohol  and or any Recreational drug use.  Wear Seat belts while driving.   Please note  You were cared for by a hospitalist during your hospital stay. If you have any questions about your discharge medications or the care you received while you were in the hospital after you are discharged, you can call the unit and asked to speak with the hospitalist on call if the hospitalist that took care of you is not available. Once you are discharged, your primary care physician will handle any further medical issues. Please note that NO REFILLS for any discharge medications will be authorized once you are discharged, as it is imperative that you return to your primary care physician (or establish a relationship with a primary care physician if you do not have one) for your aftercare needs so that they can reassess your need for medications and monitor your lab values.   Increase activity slowly  As directed        Medication List    STOP taking these medications       hydrochlorothiazide 25 MG tablet  Commonly known as:  HYDRODIURIL      TAKE these medications       ALEVE PO  Take 200 mg by mouth as needed.     aspirin 81 MG tablet  Take 81 mg by mouth daily.     carvedilol 6.25 MG tablet  Commonly known as:  COREG  Take 6.25 mg by mouth 2 (two) times daily with a meal.     folic acid 1 MG tablet  Commonly known as:  FOLVITE  Take 1 mg by mouth daily.     KLOR-CON M10 10 MEQ tablet    Generic drug:  potassium chloride  TAKE 1 TABLET (10 MEQ TOTAL) BY MOUTH DAILY.     lisinopril 5 MG tablet  Commonly known as:  PRINIVIL,ZESTRIL  TAKE 1 TABLET (5 MG TOTAL) BY MOUTH  2 (TWO) TIMES DAILY.     LORazepam 1 MG tablet  Commonly known as:  ATIVAN  Take 1 mg by mouth 2 (two) times daily.     multivitamin capsule  Take 1 capsule by mouth daily.     nitroGLYCERIN 0.4 MG SL tablet  Commonly known as:  NITROSTAT  Place 0.4 mg under the tongue every 5 (five) minutes as needed.     rosuvastatin 20 MG tablet  Commonly known as:  CRESTOR  Take 1 tablet (20 mg total) by mouth daily.     tamsulosin 0.4 MG Caps capsule  Commonly known as:  FLOMAX  Take 0.4 mg by mouth daily.     vitamin B-12 50 MCG tablet  Commonly known as:  CYANOCOBALAMIN  Take 50 mcg by mouth 2 (two) times daily.          Diet and Activity recommendation: See Discharge Instructions above   Consults obtained -     Major procedures and Radiology Reports - PLEASE review detailed and final reports for all details, in brief -   Carotid duplex unremarkable   Ct Head Wo Contrast  11/16/2013   CLINICAL DATA:  Dizziness.  Unsteady gait.  Headache yesterday.  EXAM: CT HEAD WITHOUT CONTRAST  TECHNIQUE: Contiguous axial images were obtained from the base of the skull through the vertex without intravenous contrast.  COMPARISON:  02/01/2011.  FINDINGS: Ventricular system normal in size and appearance for age. Mild age related cortical atrophy. Severe changes of Farley vessel disease of the white matter diffusely, particularly the corona radiata of both cerebral hemispheres. No mass lesion. No midline shift. No acute hemorrhage or hematoma. No extra-axial fluid collections. No evidence of acute infarction.  Large mucous retention cyst or polyp in the left maxillary sinus. Remaining visualized paranasal sinuses, bilateral mastoid air cells, and bilateral middle ear cavities well aerated. No skull fracture or other  focal osseous abnormality involving the skull. Bilateral carotid siphon and vertebral artery atherosclerosis.  IMPRESSION: 1. No acute intracranial abnormality. 2. Stable mild cortical atrophy and severe chronic microvascular ischemic changes of the white matter. 3. Chronic left maxillary sinusitis.   Electronically Signed   By: Evangeline Dakin M.D.   On: 11/16/2013 04:19    Micro Results      No results found for this or any previous visit (from the past 240 hour(s)).   History of present illness and  Hospital Course:     Kindly see H&P for history of present illness and admission details, please review complete Labs, Consult reports and Test reports for all details in brief Alexander Farley, is a 80 y.o. male, patient with history of CAD, hypertension, intermittent atrial fibrillation, BPH, dyslipidemia was admitted to the hospital with generalized weakness secondary to significant orthostatic hypotension.   His EKG was nonacute except for old right bundle branch block, ruled out for MI with negative troponins, received IV fluids and HCTZ was discontinued, now orthostatics are stable and he is symptom free, will be discharged home on home medications except ACTZ, we'll request PCP to continue monitoring his blood pressure and orthostatics and adjust medications as needed. He underwent carotid duplex which was unremarkable.     For his CAD, hypertension, intermittent atrial fibrillation he will continue his home medications which include Coreg, ACE inhibitor, follow with PCP in 1 week and cardiologist as desired.    For BPH and dyslipidemia she will continue as a blocker and statin unchanged.   Today   Subjective:  Alexander Farley today has no headache,no chest abdominal pain,no new weakness tingling or numbness, feels much better wants to go home today.   Objective:   Blood pressure 154/104, pulse 77, temperature 97.9 F (36.6 C), temperature source Oral, resp. rate 20, height 5\' 9"   (1.753 m), weight 76 kg (167 lb 8.8 oz), SpO2 100.00%.   Intake/Output Summary (Last 24 hours) at 11/17/13 1049 Last data filed at 11/17/13 0824  Gross per 24 hour  Intake    483 ml  Output   1300 ml  Net   -817 ml    Exam Awake Alert, Oriented *3, No new F.N deficits, Normal affect [place].AT,PERRAL Supple Neck,No JVD, No cervical lymphadenopathy appriciated.  Symmetrical Chest wall movement, Good air movement bilaterally, CTAB RRR,No Gallops,Rubs or new Murmurs, No Parasternal Heave +ve B.Sounds, Abd Soft, Non tender, No organomegaly appriciated, No rebound -guarding or rigidity. No Cyanosis, Clubbing or edema, No new Rash or bruise  Data Review   CBC w Diff: Lab Results  Component Value Date   WBC 4.7 11/15/2013   HGB 13.8 11/15/2013   HCT 41.3 11/15/2013   PLT 213 11/15/2013   LYMPHOPCT 39.5 06/26/2012   MONOPCT 6.6 06/26/2012   EOSPCT 1.6 06/26/2012   BASOPCT 0.6 06/26/2012    CMP: Lab Results  Component Value Date   NA 139 11/15/2013   K 4.2 11/15/2013   CL 101 11/15/2013   CO2 27 11/15/2013   BUN 19 11/15/2013   CREATININE 1.02 11/15/2013   PROT 7.1 10/16/2013   ALBUMIN 3.8 10/16/2013   BILITOT 0.5 10/16/2013   ALKPHOS 51 10/16/2013   AST 32 10/16/2013   ALT 36 10/16/2013  .   Total Time in preparing paper work, data evaluation and todays exam - 35 minutes  Thurnell Lose M.D on 11/17/2013 at 10:49 AM  [facility] Office  [phone]

## 2013-11-17 NOTE — Progress Notes (Signed)
Utilization review completed.  

## 2013-11-17 NOTE — Progress Notes (Signed)
*  PRELIMINARY RESULTS* Vascular Ultrasound Carotid Duplex (Doppler) has been completed.  Preliminary findings: Bilateral:  1-39% ICA stenosis.  Vertebral artery flow is antegrade.      Landry Mellow, RDMS, RVT  11/17/2013, 9:07 AM

## 2014-01-12 ENCOUNTER — Other Ambulatory Visit: Payer: Self-pay | Admitting: Cardiovascular Disease

## 2014-01-21 ENCOUNTER — Telehealth: Payer: Self-pay | Admitting: *Deleted

## 2014-01-21 NOTE — Telephone Encounter (Signed)
Crestor samples placed at the front desk for pick up. 

## 2014-01-23 ENCOUNTER — Ambulatory Visit (INDEPENDENT_AMBULATORY_CARE_PROVIDER_SITE_OTHER): Payer: Medicare Other | Admitting: *Deleted

## 2014-01-23 DIAGNOSIS — I2510 Atherosclerotic heart disease of native coronary artery without angina pectoris: Secondary | ICD-10-CM

## 2014-01-23 DIAGNOSIS — I10 Essential (primary) hypertension: Secondary | ICD-10-CM

## 2014-01-23 LAB — HEPATIC FUNCTION PANEL
ALBUMIN: 3.9 g/dL (ref 3.5–5.2)
ALT: 23 U/L (ref 0–53)
AST: 24 U/L (ref 0–37)
Alkaline Phosphatase: 50 U/L (ref 39–117)
Bilirubin, Direct: 0 mg/dL (ref 0.0–0.3)
Total Bilirubin: 0.5 mg/dL (ref 0.2–1.2)
Total Protein: 6.8 g/dL (ref 6.0–8.3)

## 2014-01-23 LAB — BASIC METABOLIC PANEL
BUN: 13 mg/dL (ref 6–23)
CALCIUM: 9 mg/dL (ref 8.4–10.5)
CO2: 28 mEq/L (ref 19–32)
Chloride: 106 mEq/L (ref 96–112)
Creatinine, Ser: 0.9 mg/dL (ref 0.4–1.5)
GFR: 103.45 mL/min (ref >60.00)
Glucose, Bld: 96 mg/dL (ref 70–99)
Potassium: 4.4 mEq/L (ref 3.5–5.1)
SODIUM: 140 meq/L (ref 135–145)

## 2014-01-23 LAB — LIPID PANEL
CHOL/HDL RATIO: 3
Cholesterol: 186 mg/dL (ref 0–200)
HDL: 58.2 mg/dL (ref >39.00)
LDL Cholesterol: 118 mg/dL — ABNORMAL HIGH (ref 0–99)
TRIGLYCERIDES: 50 mg/dL (ref 0.0–149.0)
VLDL: 10 mg/dL (ref 0.0–40.0)

## 2014-01-24 ENCOUNTER — Other Ambulatory Visit: Payer: Medicare Other

## 2014-01-28 ENCOUNTER — Ambulatory Visit (INDEPENDENT_AMBULATORY_CARE_PROVIDER_SITE_OTHER): Payer: Medicare Other | Admitting: Cardiovascular Disease

## 2014-01-28 ENCOUNTER — Encounter: Payer: Self-pay | Admitting: Cardiovascular Disease

## 2014-01-28 VITALS — BP 148/90 | HR 72 | Ht 69.0 in | Wt 163.0 lb

## 2014-01-28 DIAGNOSIS — I10 Essential (primary) hypertension: Secondary | ICD-10-CM

## 2014-01-28 DIAGNOSIS — I2510 Atherosclerotic heart disease of native coronary artery without angina pectoris: Secondary | ICD-10-CM

## 2014-01-28 NOTE — Progress Notes (Signed)
Alexander Farley Date of Birth  June 21, 1933 [facility] [address]     [place], [place]  [address] [phone]  Fax  [phone]  Problem list: 1. Coronary artery disease- status post PTCA and stenting of the proximal LAD and later status post CABG 2. Intermittent atrial fibrillation 3. Dyslipidemia 4. Hypertension 5. Anxiety  History of Present Illness:  Pt is doing well.  He has occasional tingling sensation in his chest  His BP has been OK but is higher today.  He has taken his meds.  He's been keeping up with his blood pressure readings at home. His blood pressure readings are all in the normal range.  He has not been watching his salt as closely as he should.  A home health nurse came out to visit him. She told him that he had a heart murmur. He's been very concerned about this new murmur.  Jan 25, 2013:  Alexander Farley is feeling well.  He has some mild orthostasis symptoms.   Nov. 17, 2014:  Alexander Farley is feeling well.  BP is a bit high today.  His readings at home are all in the normal range.  No CP or dyspnea.  Exercising regularly with his dogs and also is going to the [facility] .  January 28, 2014:  Alexander Farley is doing ok.  BP is elevated.  Cannot afford Zetia.  Suggested that he take 1/2 tablet.    Current Outpatient Prescriptions on File Prior to Visit  Medication Sig Dispense Refill  . aspirin 81 MG tablet Take 81 mg by mouth daily.        . carvedilol (COREG) 6.25 MG tablet Take 6.25 mg by mouth 2 (two) times daily with a meal.      . folic acid (FOLVITE) 1 MG tablet Take 1 mg by mouth daily.        Marland Kitchen KLOR-CON M10 10 MEQ tablet TAKE 1 TABLET BY MOUTH DAILY  30 tablet  0  . lisinopril (PRINIVIL,ZESTRIL) 5 MG tablet TAKE 1 TABLET (5 MG TOTAL) BY MOUTH 2 (TWO) TIMES DAILY.  60 tablet  6  . LORazepam (ATIVAN) 1 MG tablet Take 1 mg by mouth 2 (two) times daily.        . Multiple Vitamin (MULTIVITAMIN) capsule Take 1 capsule by mouth daily.         . Naproxen Sodium (ALEVE PO) Take 200 mg by mouth as needed.       . nitroGLYCERIN (NITROSTAT) 0.4 MG SL tablet Place 0.4 mg under the tongue every 5 (five) minutes as needed.        . rosuvastatin (CRESTOR) 20 MG tablet Take 1 tablet (20 mg total) by mouth daily.  30 tablet  5  . Tamsulosin HCl (FLOMAX) 0.4 MG CAPS Take 0.4 mg by mouth daily.        . vitamin B-12 (CYANOCOBALAMIN) 50 MCG tablet Take 50 mcg by mouth 2 (two) times daily.       . [DISCONTINUED] potassium chloride (K-DUR) 10 MEQ tablet Take 1 tablet (10 mEq total) by mouth daily.  30 tablet  6   No current facility-administered medications on file prior to visit.    Allergies  Allergen Reactions  . Zocor [Simvastatin] Other (See Comments)    Caused drop in blood pressure    Past Medical History  Diagnosis Date  . Coronary artery disease   . Intermittent atrial fibrillation   . Dyslipidemia   . Hypertension   .  Anxiety   . ED (erectile dysfunction)   . RBBB plus LA hemiblock   . Palpitations   . Back pain     Past Surgical History  Procedure Laterality Date  . Cardiac catheterization  10/12/2009    NORMAL LEFT VENTRICULAR SYSTOLIC FUNCTION. EF 60-65%  . Coronary angioplasty with stent placement    . Coronary artery bypass graft      FEb. 2011  . Eye surgery  02-07-11    Right Eye    History  Smoking status  . Former Smoker  . Quit date: 01/30/1985  Smokeless tobacco  . Not on file    History  Alcohol Use No    Family History  Problem Relation Age of Onset  . Stroke Mother   . Stroke Father     Reviw of Systems:  Reviewed in the HPI.  All other systems are negative.  Physical Exam: BP 185/103  Pulse 72  Ht 5\' 9"  (1.753 m)  Wt 163 lb (73.936 kg)  BMI 24.06 kg/m2 The patient is alert and oriented x 3.  The mood and affect are normal.   Skin: warm and dry.  Color is normal.    HEENT:   the sclera are nonicteric.  The mucous membranes are moist.  The carotids are 2+ without bruits.   There is no thyromegaly.  There is no JVD.    Lungs: clear.  The chest wall is non tender.    Heart: regular rate with a normal S1 and split S2.  There are no significant murmurs, gallops, or rubs. The PMI is not displaced.     Abdomen: good bowel sounds.  There is no guarding or rebound.  There is no hepatosplenomegaly or tenderness.  There are no masses.   Extremities:  no clubbing, cyanosis, or edema.  The legs are without rashes.  The distal pulses are intact.   Neuro:  Cranial nerves II - XII are intact.  Motor and sensory functions are intact.    The gait is normal.  ECG:  Nov. 17, 2014:  NSR at 65, RBBB, LAHB, LVH with QRS widening.   Assessment / Plan:

## 2014-01-28 NOTE — Assessment & Plan Note (Signed)
His blood pressure is elevated today but he brought his blood pressure log and has some readings in the low/normal range. He admits to eating quite a bit of salt on occasion. I think that his hypertension is due to to excess salt. We'll advise him on low salt diet.

## 2014-01-28 NOTE — Assessment & Plan Note (Signed)
His last LDL is 118. He does not want to by the Zetia.  He still is eating a lot of fatty meats including sausage bacon. I've advised him to eat only lean chicken, fish, and other lean meats. He'll continue on the Crestor. We'll recheck his fasting lipids again in 6 months.

## 2014-01-28 NOTE — Patient Instructions (Addendum)
Buy salt substitute ( No-Salt or other brands) to help your BP.  REDUCE HIGH SODIUM FOODS LIKE CANNED SOUP, GRAVY, SAUCES, READY PREPARED FOODS LIKE FROZEN FOODS; LEAN CUISINE, LASAGNA. BACON, SAUSAGE, LUNCH MEAT, FAST FOODS, HOT DOGS, CHIPS, PIZZA.   REDUCE HIGH FAT FOODS SUCH AS FAST FOOD, HAMBURGERS, PIZZA, HOT DOGS, BACON, SAUSAGE  Your physician recommends that you continue on your current medications as directed. Please refer to the Current Medication list given to you today.  Your physician wants you to follow-up in: 6 months with Dr. Acie Fredrickson.  You will receive a reminder letter in the mail two months in advance. If you don't receive a letter, please call our office to schedule the follow-up appointment.  Your physician recommends that you return for lab work in: 6 months on the day of or a few days before your office visit with Dr. Acie Fredrickson.  You will need to FAST for this appointment - nothing to eat or drink after midnight the night before except water.

## 2014-02-22 ENCOUNTER — Other Ambulatory Visit: Payer: Self-pay | Admitting: Cardiovascular Disease

## 2014-02-24 ENCOUNTER — Telehealth: Payer: Self-pay | Admitting: *Deleted

## 2014-02-24 NOTE — Telephone Encounter (Signed)
Patient requests crestor samples. I will place at the front desk for pick up.

## 2014-02-26 DIAGNOSIS — Z9289 Personal history of other medical treatment: Secondary | ICD-10-CM

## 2014-02-26 HISTORY — DX: Personal history of other medical treatment: Z92.89

## 2014-03-21 ENCOUNTER — Other Ambulatory Visit: Payer: Self-pay | Admitting: Cardiovascular Disease

## 2014-03-27 ENCOUNTER — Observation Stay (HOSPITAL_COMMUNITY)
Admission: EM | Admit: 2014-03-27 | Discharge: 2014-03-28 | Disposition: A | Discharge status: Home / Self Care | Payer: Medicare Other | Attending: Cardiology | Admitting: Cardiology

## 2014-03-27 ENCOUNTER — Emergency Department (HOSPITAL_COMMUNITY): Payer: Medicare Other

## 2014-03-27 ENCOUNTER — Encounter (HOSPITAL_COMMUNITY): Payer: Self-pay | Admitting: Emergency Medicine

## 2014-03-27 DIAGNOSIS — Z7982 Long term (current) use of aspirin: Secondary | ICD-10-CM | POA: Diagnosis not present

## 2014-03-27 DIAGNOSIS — Z951 Presence of aortocoronary bypass graft: Secondary | ICD-10-CM | POA: Insufficient documentation

## 2014-03-27 DIAGNOSIS — R079 Chest pain, unspecified: Secondary | ICD-10-CM | POA: Diagnosis present

## 2014-03-27 DIAGNOSIS — F411 Generalized anxiety disorder: Secondary | ICD-10-CM | POA: Insufficient documentation

## 2014-03-27 DIAGNOSIS — E785 Hyperlipidemia, unspecified: Secondary | ICD-10-CM | POA: Diagnosis not present

## 2014-03-27 DIAGNOSIS — R0602 Shortness of breath: Secondary | ICD-10-CM | POA: Diagnosis not present

## 2014-03-27 DIAGNOSIS — I25110 Atherosclerotic heart disease of native coronary artery with unstable angina pectoris: Secondary | ICD-10-CM

## 2014-03-27 DIAGNOSIS — I4891 Unspecified atrial fibrillation: Secondary | ICD-10-CM | POA: Insufficient documentation

## 2014-03-27 DIAGNOSIS — I2510 Atherosclerotic heart disease of native coronary artery without angina pectoris: Secondary | ICD-10-CM | POA: Diagnosis not present

## 2014-03-27 DIAGNOSIS — Z79899 Other long term (current) drug therapy: Secondary | ICD-10-CM | POA: Insufficient documentation

## 2014-03-27 DIAGNOSIS — I25708 Atherosclerosis of coronary artery bypass graft(s), unspecified, with other forms of angina pectoris: Secondary | ICD-10-CM

## 2014-03-27 DIAGNOSIS — Z87891 Personal history of nicotine dependence: Secondary | ICD-10-CM | POA: Diagnosis not present

## 2014-03-27 DIAGNOSIS — Z9861 Coronary angioplasty status: Secondary | ICD-10-CM | POA: Insufficient documentation

## 2014-03-27 DIAGNOSIS — I480 Paroxysmal atrial fibrillation: Secondary | ICD-10-CM

## 2014-03-27 DIAGNOSIS — Z87448 Personal history of other diseases of urinary system: Secondary | ICD-10-CM | POA: Diagnosis not present

## 2014-03-27 DIAGNOSIS — I209 Angina pectoris, unspecified: Secondary | ICD-10-CM

## 2014-03-27 DIAGNOSIS — I10 Essential (primary) hypertension: Secondary | ICD-10-CM | POA: Insufficient documentation

## 2014-03-27 DIAGNOSIS — I25810 Atherosclerosis of coronary artery bypass graft(s) without angina pectoris: Secondary | ICD-10-CM

## 2014-03-27 DIAGNOSIS — Z9889 Other specified postprocedural states: Secondary | ICD-10-CM | POA: Diagnosis not present

## 2014-03-27 DIAGNOSIS — I200 Unstable angina: Secondary | ICD-10-CM | POA: Diagnosis present

## 2014-03-27 DIAGNOSIS — R55 Syncope and collapse: Secondary | ICD-10-CM

## 2014-03-27 LAB — BASIC METABOLIC PANEL
ANION GAP: 12 (ref 5–15)
BUN: 13 mg/dL (ref 6–23)
CHLORIDE: 103 meq/L (ref 96–112)
CO2: 25 meq/L (ref 19–32)
Calcium: 9.4 mg/dL (ref 8.4–10.5)
Creatinine, Ser: 0.79 mg/dL (ref 0.50–1.35)
GFR calc Af Amer: >90 mL/min (ref >90)
GFR calc non Af Amer: 84 mL/min — ABNORMAL LOW (ref >90)
Glucose, Bld: 91 mg/dL (ref 70–99)
POTASSIUM: 4.4 meq/L (ref 3.7–5.3)
Sodium: 140 mEq/L (ref 137–147)

## 2014-03-27 LAB — CBC
HEMATOCRIT: 43 % (ref 39.0–52.0)
Hemoglobin: 13.9 g/dL (ref 13.0–17.0)
MCH: 29.5 pg (ref 26.0–34.0)
MCHC: 32.3 g/dL (ref 30.0–36.0)
MCV: 91.3 fL (ref 78.0–100.0)
PLATELETS: 186 10*3/uL (ref 150–400)
RBC: 4.71 MIL/uL (ref 4.22–5.81)
RDW: 15.2 % (ref 11.5–15.5)
WBC: 5.8 10*3/uL (ref 4.0–10.5)

## 2014-03-27 LAB — I-STAT TROPONIN, ED
TROPONIN I, POC: 0 ng/mL (ref 0.00–0.08)
Troponin i, poc: 0 ng/mL (ref 0.00–0.08)

## 2014-03-27 LAB — APTT: APTT: 28 s (ref 24–37)

## 2014-03-27 LAB — PROTIME-INR
INR: 1.09 (ref 0.00–1.49)
PROTHROMBIN TIME: 14.1 s (ref 11.6–15.2)

## 2014-03-27 LAB — TROPONIN I

## 2014-03-27 MED ORDER — ASPIRIN 81 MG PO CHEW: 324.0000 mg | CHEWABLE_TABLET | Freq: Once | ORAL | Status: DC

## 2014-03-27 MED ORDER — SODIUM CHLORIDE 0.9 % IJ SOLN: 3.0000 mL | INTRAMUSCULAR | Status: DC | PRN

## 2014-03-27 MED ORDER — SODIUM CHLORIDE 0.9 % IV SOLN: 1000.0000 mL | INTRAVENOUS | Status: DC

## 2014-03-27 MED ORDER — MORPHINE SULFATE 2 MG/ML IJ SOLN
2.0000 mg | INTRAMUSCULAR | Status: DC | PRN
  Administered 2014-03-27: 2 mg via INTRAVENOUS
  Filled 2014-03-27: qty 1

## 2014-03-27 MED ORDER — ATORVASTATIN CALCIUM 80 MG PO TABS
80.0000 mg | ORAL_TABLET | Freq: Every day | ORAL | Status: DC
  Administered 2014-03-28: 80 mg via ORAL
  Filled 2014-03-27 (×2): qty 1

## 2014-03-27 MED ORDER — MULTIVITAMINS PO CAPS: 1.0000 | ORAL_CAPSULE | Freq: Every day | ORAL | Status: DC

## 2014-03-27 MED ORDER — NITROGLYCERIN 2 % TD OINT
1.0000 [in_us] | TOPICAL_OINTMENT | Freq: Once | TRANSDERMAL | Status: AC
  Administered 2014-03-27: 1 [in_us] via TOPICAL
  Filled 2014-03-27: qty 1

## 2014-03-27 MED ORDER — NITROGLYCERIN 0.4 MG SL SUBL
0.4000 mg | SUBLINGUAL_TABLET | SUBLINGUAL | Status: DC | PRN
  Administered 2014-03-27: 0.4 mg via SUBLINGUAL
  Filled 2014-03-27 (×2): qty 1

## 2014-03-27 MED ORDER — ACETAMINOPHEN 325 MG PO TABS: 650.0000 mg | ORAL_TABLET | ORAL | Status: DC | PRN

## 2014-03-27 MED ORDER — SODIUM CHLORIDE 0.9 % IV SOLN: 250.0000 mL | INTRAVENOUS | Status: DC | PRN

## 2014-03-27 MED ORDER — ASPIRIN 81 MG PO CHEW
81.0000 mg | CHEWABLE_TABLET | Freq: Every day | ORAL | Status: DC
  Administered 2014-03-28: 81 mg via ORAL
  Filled 2014-03-27 (×2): qty 1

## 2014-03-27 MED ORDER — HEPARIN SODIUM (PORCINE) 5000 UNIT/ML IJ SOLN
5000.0000 [IU] | Freq: Three times a day (TID) | INTRAMUSCULAR | Status: DC
  Administered 2014-03-27 – 2014-03-28 (×2): 5000 [IU] via SUBCUTANEOUS
  Filled 2014-03-27 (×3): qty 1

## 2014-03-27 MED ORDER — ONDANSETRON HCL 4 MG/2ML IJ SOLN: 4.0000 mg | Freq: Four times a day (QID) | INTRAMUSCULAR | Status: DC | PRN

## 2014-03-27 MED ORDER — TAMSULOSIN HCL 0.4 MG PO CAPS
0.4000 mg | ORAL_CAPSULE | Freq: Every day | ORAL | Status: DC
  Administered 2014-03-28: 0.4 mg via ORAL
  Filled 2014-03-27 (×2): qty 1

## 2014-03-27 MED ORDER — REGADENOSON 0.4 MG/5ML IV SOLN
0.4000 mg | Freq: Once | INTRAVENOUS | Status: AC
  Administered 2014-03-28: 0.4 mg via INTRAVENOUS
  Filled 2014-03-27: qty 5

## 2014-03-27 MED ORDER — LISINOPRIL 10 MG PO TABS: 10.0000 mg | ORAL_TABLET | Freq: Two times a day (BID) | ORAL | Status: DC

## 2014-03-27 MED ORDER — LORAZEPAM 1 MG PO TABS
1.0000 mg | ORAL_TABLET | Freq: Two times a day (BID) | ORAL | Status: DC
  Administered 2014-03-27: 1 mg via ORAL
  Filled 2014-03-27: qty 1

## 2014-03-27 MED ORDER — NITROGLYCERIN 0.4 MG SL SUBL: 0.4000 mg | SUBLINGUAL_TABLET | SUBLINGUAL | Status: DC | PRN

## 2014-03-27 MED ORDER — FOLIC ACID 1 MG PO TABS
1.0000 mg | ORAL_TABLET | Freq: Every day | ORAL | Status: DC
  Administered 2014-03-28: 1 mg via ORAL
  Filled 2014-03-27: qty 1

## 2014-03-27 MED ORDER — LISINOPRIL 10 MG PO TABS
10.0000 mg | ORAL_TABLET | Freq: Every day | ORAL | Status: DC
  Administered 2014-03-27 – 2014-03-28 (×2): 10 mg via ORAL
  Filled 2014-03-27 (×2): qty 1

## 2014-03-27 MED ORDER — SODIUM CHLORIDE 0.9 % IJ SOLN
3.0000 mL | Freq: Two times a day (BID) | INTRAMUSCULAR | Status: DC
  Administered 2014-03-27: 3 mL via INTRAVENOUS

## 2014-03-27 MED ORDER — ADULT MULTIVITAMIN W/MINERALS CH
1.0000 | ORAL_TABLET | Freq: Every day | ORAL | Status: DC
  Administered 2014-03-28: 1 via ORAL
  Filled 2014-03-27: qty 1

## 2014-03-27 MED ORDER — CARVEDILOL 3.125 MG PO TABS
3.1250 mg | ORAL_TABLET | Freq: Two times a day (BID) | ORAL | Status: DC
  Administered 2014-03-28: 3.125 mg via ORAL
  Filled 2014-03-27 (×3): qty 1

## 2014-03-27 NOTE — H&P (Signed)
CARDIOLOGY CONSULT NOTE   Patient ID: Alexander Farley MRN: [id number], DOB/AGE: 78-Dec-1934   Admit date: 03/27/2014 Date of Consult: 03/27/2014  Primary Physician: Simona Huh, MD Primary Cardiologist: Dr Liam Rogers  Reason for consult:  Chest pain  Problem List  Past Medical History  Diagnosis Date  . Coronary artery disease   . Intermittent atrial fibrillation   . Dyslipidemia   . Hypertension   . Anxiety   . ED (erectile dysfunction)   . RBBB plus LA hemiblock   . Palpitations   . Back pain     Past Surgical History  Procedure Laterality Date  . Cardiac catheterization  10/12/2009    NORMAL LEFT VENTRICULAR SYSTOLIC FUNCTION. EF 60-65%  . Coronary angioplasty with stent placement    . Coronary artery bypass graft      FEb. 2011  . Eye surgery  02-07-11    Right Eye    Allergies  Allergies  Allergen Reactions  . Zocor [Simvastatin] Other (See Comments)    Caused drop in blood pressure    HPI   80 year old male with h/o HTN, HLP, CAD, s/p PCI to LAD and CABG in 2011 (Early recent stenosis of LAD stent with unstable angina and gastrointestinal bleed, CABG x2 with LIMA to LAD and reverse SVG to OM1 by Dr Servando Snare) who presented with chest pain. The patient is followed in the clinic by Dr. Acie Fredrickson and was last seen on 01/28/2013 when he reported just "occasional tingling sensation in his chest". His BP and LDL were elevated at the time.  Today he presented with chest pain that started 2 days ago. Its is pressure like, left sided and ongoing. It has improved but then woke him up again yesterday and has persisted ever since. Nitro paste in the ER alleviated pain from 6 to 4-5/10. He has also noticed worsening DOE in the last month, especially when walking uphill. No LE edema, no orthopnea, PND.  He has also noticed overall fatigue.   Home meds:  aspirin 81 MG tablet  carvedilol (COREG) 6.25 MG tablet BID KLOR-CON M10 10 MEq daily Lisinopril (PRINIVIL,ZESTRIL) 5 MG  tablet  nitroGLYCERIN (NITROSTAT) 0.4 MG SL tablet  rosuvastatin (CRESTOR) 20 MG tablet   PRN Meds:.nitroGLYCERIN  Inpatient Medications  . aspirin  324 mg Oral Once   Family History Family History  Problem Relation Age of Onset  . Stroke Mother   . Stroke Father     Social History History   Social History  . Marital Status: Married    Spouse Name: N/A    Number of Children: N/A  . Years of Education: N/A   Occupational History  . Not on file.   Social History Main Topics  . Smoking status: Former Smoker    Quit date: 01/30/1985  . Smokeless tobacco: Not on file  . Alcohol Use: No  . Drug Use: No  . Sexual Activity: No   Other Topics Concern  . Not on file   Social History Narrative  . No narrative on file    Review of Systems  General:  No chills, fever, night sweats or weight changes.  Cardiovascular:  No chest pain, dyspnea on exertion, edema, orthopnea, palpitations, paroxysmal nocturnal dyspnea. Dermatological: No rash, lesions/masses Respiratory: No cough, dyspnea Urologic: No hematuria, dysuria Abdominal:   No nausea, vomiting, diarrhea, bright red blood per rectum, melena, or hematemesis Neurologic:  No visual changes, wkns, changes in mental status. All other systems reviewed and are otherwise  negative except as noted above.  Physical Exam  Blood pressure 126/77, pulse 80, temperature 98.3 F (36.8 C), temperature source Oral, resp. rate 12, SpO2 99.00%.  General: Pleasant, NAD Psych: Normal affect. Neuro: Alert and oriented X 3. Moves all extremities spontaneously. HEENT: Normal  Neck: Supple without bruits or JVD. Lungs:  Resp regular and unlabored, CTA. Heart: RRR no s3, s4, or murmurs. Abdomen: Soft, non-tender, non-distended, BS + x 4.  Extremities: No clubbing, cyanosis or edema. DP/PT/Radials 2+ and equal bilaterally.  Labs  No results found for this basename: CKTOTAL, CKMB, TROPONINI,  in the last 72 hours Lab Results  Component  Value Date   WBC 5.8 03/27/2014   HGB 13.9 03/27/2014   HCT 43.0 03/27/2014   MCV 91.3 03/27/2014   PLT 186 03/27/2014    Recent Labs Lab 03/27/14 1516  NA 140  K 4.4  CL 103  CO2 25  BUN 13  CREATININE 0.79  CALCIUM 9.4  GLUCOSE 91   Lab Results  Component Value Date   CHOL 186 01/23/2014   HDL 58.20 01/23/2014   LDLCALC 118* 01/23/2014   TRIG 50.0 01/23/2014   Radiology/Studies  Dg Chest 2 View  03/27/2014   CLINICAL DATA:  Difficulty breathing and chest pain  EXAM: CHEST  2 VIEW  COMPARISON:  February 20, 2010.  IMPRESSION: Emphysematous change with minimal bibasilar scarring. No edema or consolidation.     Echocardiogram - none in the last 10 years, in 2005 normal LVEF 60%, mild TR  ECG: SR, 70 BPM, RBBB, LAFB, LAD, no change when compared to ECG from 11/15/2013   ASSESSMENT AND PLAN  Problem list:  1. Chest pain 2. Coronary artery disease- status post PTCA and stenting of the proximal LAD and later status post CABG in 2011 3. Paroxysmal atrial fibrillation  4. Dyslipidemia  5. Hypertension  6. Anxiety  1. Chest pain - with some typical and atypical features, ECG unchanged from prior. Elevated BP on multiple occasions, lipids not at goal. No stress testing since the CABG in 2011. I will order a nuclear stress test, however if his symptoms worsens I would consider cardiac cath. Continue ASA, crestor, lisinopril and carvedilol. He needs better BP control.  2. HTN - I will uptitrate lisinopril to 10 mg po daily (additional 5 mg now) and consider uptitrating carvedilol tomorrow if BP still above 140/90.  3. Hyperlipidemia - on 20 mg crestor, LDL 118, goal < 100, we will increase to 40 mg po daily   Signed, Dorothy Spark, MD, [facility] 03/27/2014, 6:47 PM

## 2014-03-27 NOTE — ED Notes (Signed)
Presents with left sided chest pain described as intermittent, occurring at rest and with exertion associated with generalized fatigue and weakness. Pain began 2 days ago and described as heaviness. BP is elevated.

## 2014-03-27 NOTE — ED Notes (Signed)
cardiology at bedside

## 2014-03-27 NOTE — ED Provider Notes (Signed)
CSN: [id number]     Arrival date & time 03/27/14  1456 History   First MD Initiated Contact with Patient 03/27/14 1601     Chief Complaint  Patient presents with  . Chest Pain   Patient is a 80 y.o. male presenting with chest pain. The history is provided by the patient.  Chest Pain Pain location:  L chest Pain quality comment:  Throbbing, heaviness Pain radiates to:  Does not radiate Pain radiates to the back: no   Pain severity:  Moderate Duration:  2 days Timing:  Intermittent (Last just a second or two.   For the last day or two, 3-4 times per day.  It was there this morning and off and on all day today.) Progression:  Worsening Relieved by:  Rest Worsened by:  Exertion Associated symptoms: shortness of breath   Associated symptoms: no cough, no fever, no nausea and not vomiting   Risk factors: coronary artery disease and hypertension   He has also noticed pain in his chest when walking.  Usually a normal pace is ok but when he has to walk faster he starts getting pain in his chest.  That lasts longer then just a few seconds.  Maybe more on the order of minutes to an hour.  He saw his doctor today and was told to come to the ED.  Past Medical History  Diagnosis Date  . Coronary artery disease   . Intermittent atrial fibrillation   . Dyslipidemia   . Hypertension   . Anxiety   . ED (erectile dysfunction)   . RBBB plus LA hemiblock   . Palpitations   . Back pain    Past Surgical History  Procedure Laterality Date  . Cardiac catheterization  10/12/2009    NORMAL LEFT VENTRICULAR SYSTOLIC FUNCTION. EF 60-65%  . Coronary angioplasty with stent placement    . Coronary artery bypass graft      FEb. 2011  . Eye surgery  02-07-11    Right Eye   Family History  Problem Relation Age of Onset  . Stroke Mother   . Stroke Father    History  Substance Use Topics  . Smoking status: Former Smoker    Quit date: 01/30/1985  . Smokeless tobacco: Not on file  . Alcohol Use: No     Review of Systems  Constitutional: Negative for fever.  Respiratory: Positive for shortness of breath. Negative for cough.   Cardiovascular: Positive for chest pain.  Gastrointestinal: Negative for nausea and vomiting.  All other systems reviewed and are negative.     Allergies  Zocor  Home Medications   Prior to Admission medications   Medication Sig Start Date End Date Taking? Authorizing Provider  aspirin 81 MG tablet Take 81 mg by mouth daily.    Yes Historical Provider, MD  carvedilol (COREG) 6.25 MG tablet Take 3.125 mg by mouth 2 (two) times daily with a meal.    Yes Historical Provider, MD  folic acid (FOLVITE) 1 MG tablet Take 1 mg by mouth daily.     Yes Historical Provider, MD  lisinopril (PRINIVIL,ZESTRIL) 5 MG tablet Take 5 mg by mouth 2 (two) times daily.   Yes Historical Provider, MD  LORazepam (ATIVAN) 1 MG tablet Take 1 mg by mouth 2 (two) times daily.     Yes Historical Provider, MD  Multiple Vitamin (MULTIVITAMIN) capsule Take 1 capsule by mouth daily.     Yes Historical Provider, MD  naproxen sodium (ANAPROX) 220 MG tablet  Take 220 mg by mouth 2 (two) times daily with a meal.   Yes Historical Provider, MD  nitroGLYCERIN (NITROSTAT) 0.4 MG SL tablet Place 0.4 mg under the tongue every 5 (five) minutes as needed.     Yes Historical Provider, MD  potassium chloride (K-DUR) 10 MEQ tablet Take 10 mEq by mouth daily.   Yes Historical Provider, MD  rosuvastatin (CRESTOR) 20 MG tablet Take 20 mg by mouth daily.   Yes Historical Provider, MD  Tamsulosin HCl (FLOMAX) 0.4 MG CAPS Take 0.4 mg by mouth daily.     Yes Historical Provider, MD  vitamin B-12 (CYANOCOBALAMIN) 50 MCG tablet Take 50 mcg by mouth 2 (two) times daily.    Yes Historical Provider, MD   BP 126/77  Pulse 80  Temp(Src) 98.3 F (36.8 C) (Oral)  Resp 12  SpO2 99% Physical Exam  Nursing note and vitals reviewed. Constitutional: He appears well-developed and well-nourished. No distress.  HENT:   Head: Normocephalic and atraumatic.  Right Ear: External ear normal.  Left Ear: External ear normal.  Eyes: Conjunctivae are normal. Right eye exhibits no discharge. Left eye exhibits no discharge. No scleral icterus.  Neck: Neck supple. No tracheal deviation present.  Cardiovascular: Normal rate, regular rhythm and intact distal pulses.   Pulmonary/Chest: Effort normal and breath sounds normal. No stridor. No respiratory distress. He has no wheezes. He has no rales.  Abdominal: Soft. Bowel sounds are normal. He exhibits no distension. There is no tenderness. There is no rebound and no guarding.  Musculoskeletal: He exhibits no edema and no tenderness.  Neurological: He is alert. He has normal strength. No cranial nerve deficit (no facial droop, extraocular movements intact, no slurred speech) or sensory deficit. He exhibits normal muscle tone. He displays no seizure activity. Coordination normal.  Skin: Skin is warm and dry. No rash noted.  Psychiatric: He has a normal mood and affect.    ED Course  Procedures (including critical care time) Labs Review Labs Reviewed  BASIC METABOLIC PANEL - Abnormal; Notable for the following:    GFR calc non Af Amer 84 (*)    All other components within normal limits  CBC  APTT  PROTIME-INR  I-STAT TROPOININ, ED  I-STAT TROPOININ, ED  I-STAT TROPOININ, ED    Imaging Review Dg Chest 2 View  03/27/2014   CLINICAL DATA:  Difficulty breathing and chest pain  EXAM: CHEST  2 VIEW  COMPARISON:  February 20, 2010  FINDINGS: There is underlying emphysematous change. There is minimal scarring in the bases. Elsewhere lungs are clear. Heart is upper normal in size with pulmonary vascularity within normal limits. No adenopathy. There is degenerative change in the thoracic spine. Patient is status post coronary artery bypass grafting.  IMPRESSION: Emphysematous change with minimal bibasilar scarring. No edema or consolidation.   Electronically Signed   By: Lowella Grip M.D.   On: 03/27/2014 15:49     EKG Interpretation   Date/Time:  Thursday March 27 2014 15:08:17 EDT Ventricular Rate:  70 PR Interval:  168 QRS Duration: 136 QT Interval:  440 QTC Calculation: 475 R Axis:   -74 Text Interpretation:  Normal sinus rhythm Right bundle branch block Left  anterior fascicular block ** Bifascicular block ** Moderate voltage  criteria for LVH, may be normal variant Abnormal ECG No significant change  since last tracing Confirmed by Doral Digangi  MD-J, Roxi Hlavaty ([id number]) on 03/27/2014  4:18:50 PM      MDM   Final diagnoses:  Chest  pain, unspecified chest pain type    Known history of CAD with stents and CABG.  Presents with chest pain and shortness of breath with exertion.  Seen at PCP office and sent for further evaluation.  Will consult with cardiology.    Pt was evaluated by cardiology.  Plan on admission for further evaluation    Dorie Rank, MD 03/27/14 [phone]

## 2014-03-28 ENCOUNTER — Observation Stay (HOSPITAL_COMMUNITY): Payer: Medicare Other

## 2014-03-28 ENCOUNTER — Encounter (HOSPITAL_COMMUNITY): Payer: Self-pay | Admitting: Cardiology

## 2014-03-28 DIAGNOSIS — I4891 Unspecified atrial fibrillation: Secondary | ICD-10-CM | POA: Diagnosis not present

## 2014-03-28 DIAGNOSIS — I2510 Atherosclerotic heart disease of native coronary artery without angina pectoris: Secondary | ICD-10-CM

## 2014-03-28 DIAGNOSIS — I200 Unstable angina: Secondary | ICD-10-CM

## 2014-03-28 DIAGNOSIS — R079 Chest pain, unspecified: Secondary | ICD-10-CM | POA: Diagnosis not present

## 2014-03-28 DIAGNOSIS — R0602 Shortness of breath: Secondary | ICD-10-CM | POA: Diagnosis not present

## 2014-03-28 LAB — TROPONIN I
Troponin I: <0.3 ng/mL (ref <0.30)
Troponin I: <0.3 ng/mL (ref <0.30)

## 2014-03-28 LAB — LIPID PANEL
CHOL/HDL RATIO: 3 ratio
Cholesterol: 160 mg/dL (ref 0–200)
HDL: 53 mg/dL (ref >39)
LDL CALC: 97 mg/dL (ref 0–99)
Triglycerides: 51 mg/dL (ref <150)
VLDL: 10 mg/dL (ref 0–40)

## 2014-03-28 MED ORDER — REGADENOSON 0.4 MG/5ML IV SOLN
INTRAVENOUS | Status: AC
  Administered 2014-03-28: 0.4 mg via INTRAVENOUS
  Filled 2014-03-28: qty 5

## 2014-03-28 MED ORDER — PANTOPRAZOLE SODIUM 40 MG PO TBEC: 40.0000 mg | DELAYED_RELEASE_TABLET | Freq: Every day | ORAL | Status: DC

## 2014-03-28 MED ORDER — TECHNETIUM TC 99M SESTAMIBI GENERIC - CARDIOLITE
30.0000 | Freq: Once | INTRAVENOUS | Status: AC | PRN
  Administered 2014-03-28: 30 via INTRAVENOUS

## 2014-03-28 MED ORDER — TECHNETIUM TC 99M SESTAMIBI GENERIC - CARDIOLITE
10.0000 | Freq: Once | INTRAVENOUS | Status: AC | PRN
  Administered 2014-03-28: 10 via INTRAVENOUS

## 2014-03-28 NOTE — Progress Notes (Signed)
Subjective: No further chest pain or SOB, + PVCs and PACs  Objective: Vital signs in last 24 hours: Temp:  [98.2 F (36.8 C)-98.7 F (37.1 C)] 98.7 F (37.1 C) (07/31 0500) Pulse Rate:  [54-81] 75 (07/31 0500) Resp:  [12-26] 16 (07/31 0500) BP: (100-181)/(71-97) 112/71 mmHg (07/31 0500) SpO2:  [96 %-100 %] 98 % (07/31 0500) Weight:  [156 lb 3.2 oz (70.852 kg)] 156 lb 3.2 oz (70.852 kg) (07/30 2210)    Intake/Output from previous day: 07/30 0701 - 07/31 0700 In: 3 [I.V.:3] Out: 150 [Urine:150] Intake/Output this shift:    Medications Current Facility-Administered Medications  Medication Dose Route Frequency Provider Last Rate Last Dose  . 0.9 %  sodium chloride infusion  1,000 mL Intravenous Continuous Dorie Rank, MD      . 0.9 %  sodium chloride infusion  250 mL Intravenous PRN Rogelia Mire, NP      . acetaminophen (TYLENOL) tablet 650 mg  650 mg Oral Q4H PRN Rogelia Mire, NP      . aspirin chewable tablet 81 mg  81 mg Oral Daily Rogelia Mire, NP      . atorvastatin (LIPITOR) tablet 80 mg  80 mg Oral q1800 Rogelia Mire, NP      . carvedilol (COREG) tablet 3.125 mg  3.125 mg Oral BID WC Rogelia Mire, NP      . folic acid (FOLVITE) tablet 1 mg  1 mg Oral Daily Rogelia Mire, NP      . heparin injection 5,000 Units  5,000 Units Subcutaneous 3 times per day Rogelia Mire, NP   5,000 Units at 03/28/14 [phone]  . lisinopril (PRINIVIL,ZESTRIL) tablet 10 mg  10 mg Oral Daily Rogelia Mire, NP   10 mg at 03/27/14 2255  . LORazepam (ATIVAN) tablet 1 mg  1 mg Oral BID Rogelia Mire, NP   1 mg at 03/27/14 2255  . morphine 2 MG/ML injection 2 mg  2 mg Intravenous Q4H PRN Dorothy Spark, MD   2 mg at 03/27/14 2005  . multivitamin with minerals tablet 1 tablet  1 tablet Oral Daily Dorothy Spark, MD      . nitroGLYCERIN (NITROSTAT) SL tablet 0.4 mg  0.4 mg Sublingual Q5 min PRN Dorie Rank, MD   0.4 mg at 03/27/14 2057  .  nitroGLYCERIN (NITROSTAT) SL tablet 0.4 mg  0.4 mg Sublingual Q5 min PRN Rogelia Mire, NP      . ondansetron [facility]) injection 4 mg  4 mg Intravenous Q6H PRN Rogelia Mire, NP      . regadenoson (LEXISCAN) injection SOLN 0.4 mg  0.4 mg Intravenous Once Rogelia Mire, NP      . sodium chloride 0.9 % injection 3 mL  3 mL Intravenous Q12H Rogelia Mire, NP   3 mL at 03/27/14 2255  . sodium chloride 0.9 % injection 3 mL  3 mL Intravenous PRN Rogelia Mire, NP      . tamsulosin (FLOMAX) capsule 0.4 mg  0.4 mg Oral Daily Rogelia Mire, NP        PE: General:Pleasant affect, NAD Skin:Warm and dry, brisk capillary refill HEENT:normocephalic, sclera clear, mucus membranes moist Neck:supple, no JVD Heart:S1S2 RRR without murmur, gallup, rub or click Lungs:clear without rales, rhonchi, or wheezes GYI:RSWN, non tender, + BS, do not palpate liver spleen or masses Ext:no lower ext edema, 2+ pedal pulses, 2+ radial pulses Neuro:alert and oriented, MAE, follows  commands, + facial symmetry   Lab Results:   Recent Labs  03/27/14 1516  WBC 5.8  HGB 13.9  HCT 43.0  PLT 186   BMET  Recent Labs  03/27/14 1516  NA 140  K 4.4  CL 103  CO2 25  GLUCOSE 91  BUN 13  CREATININE 0.79  CALCIUM 9.4   PT/INR  Recent Labs  03/27/14 1619  LABPROT 14.1  INR 1.09   Cholesterol  Recent Labs  03/28/14 0414  CHOL 160   Cardiac Panel (last 3 results)  Recent Labs  03/27/14 2308 03/28/14 0414  TROPONINI <0.30 <0.30     Lipid Panel     Component Value Date/Time   CHOL 160 03/28/2014 0414   TRIG 51 03/28/2014 0414   HDL 53 03/28/2014 0414   CHOLHDL 3.0 03/28/2014 0414   VLDL 10 03/28/2014 0414   LDLCALC 97 03/28/2014 0414    Assessment/Plan 1. Chest pain   Troponin negative x2.  Nuclear stress test today.  ASA, BB  2. Coronary artery disease- status post PTCA and stenting of the proximal LAD and later status post CABG in 2011  3. Paroxysmal  atrial fibrillation   NSR on EKG this morning.  4. Dyslipidemia   lipitor 80.   5. Hypertension   BP and HR controlled.  Coreg 3.125 bid, lisinopril 10 6. Anxiety     LOS: 1 day    HAGER, BRYAN PA-C 03/28/2014 9:29 AM  I have personally seen and examined this patient with Tarri Fuller, PA-C.  I agree with the assessment and plan as outlined above. No recurrence of chest pain. Stress test pending today. If no ischemia, can d/c home later.   MCALHANY,CHRISTOPHER 03/28/2014 1:12 PM

## 2014-03-28 NOTE — Progress Notes (Signed)
Lexiscan myoview completed without complications. Mild chest pressure resolved in recovery.   Nuc results to follow.

## 2014-03-28 NOTE — Progress Notes (Signed)
Utilization Review Completed.Alexander Farley T7/31/2015  

## 2014-03-28 NOTE — Discharge Instructions (Signed)
Heart healthy diet  Call if recurrent chest pain.  We added Protonix to meds in case the symptoms were related to reflux

## 2014-03-28 NOTE — Progress Notes (Signed)
Cecilie Kicks at bedside 1 min after [employer].

## 2014-03-28 NOTE — Discharge Summary (Signed)
Physician Discharge Summary       Patient ID: Alexander Farley MRN: [id number] DOB/AGE: [birth year] 78 y.o.  Admit date: 03/27/2014 Discharge date: 03/28/2014  Discharge Diagnoses:  Principal Problem:   Unstable angina, sounds anginal, neg. nuc, may be GI component  Active Problems:   CAD (coronary artery disease), S/p stenting of his ostial LAD- 2006.  CABG 2011     HTN (hypertension)   PAF (paroxysmal atrial fibrillation)   Discharged Condition: good  Primary Cardiologist: Dr. Acie Fredrickson  Procedures: none  Hospital Course: 80 year old male with h/o HTN, HLP, CAD, s/p PCI to LAD and CABG in 2011 (Early recent stenosis of LAD stent with unstable angina and gastrointestinal bleed, CABG x2 with LIMA to LAD and reverse SVG to OM1 by Dr Servando Snare) who presented with chest pain. The patient is followed in the clinic by Dr. Acie Fredrickson and was last seen on 01/28/2013 when he reported just "occasional tingling sensation in his chest". His BP and LDL were elevated at the time.  03/27/14 he presented with chest pain that started 2 days prior. Its was pressure like, left sided and ongoing. It had improved but then woke him up again and has persisted ever since. Nitro paste in the ER alleviated pain from 6 to 4-5/10. He has also noticed worsening DOE in the last month, especially when walking uphill. No LE edema, no orthopnea, PND.  He has also noticed overall fatigue.   He was admitted and cardiac enzymes are negative. He underwent Nuc study Normal study with no ischemia. LVF was normal with EF 66.  Pt has ambulated without pain.  Plan to discharge per Dr. Angelena Form and follow up with Dr. Acie Fredrickson.  I have added Protonix to meds to cover GI component.    Consults: None  Significant Diagnostic Studies:  BMET    Component Value Date/Time   NA 140 03/27/2014 1516   K 4.4 03/27/2014 1516   CL 103 03/27/2014 1516   CO2 25 03/27/2014 1516   GLUCOSE 91 03/27/2014 1516   BUN 13 03/27/2014 1516   CREATININE 0.79  03/27/2014 1516   CALCIUM 9.4 03/27/2014 1516   GFRNONAA 84* 03/27/2014 1516   GFRAA >90 03/27/2014 1516    CBC    Component Value Date/Time   WBC 5.8 03/27/2014 1516   RBC 4.71 03/27/2014 1516   HGB 13.9 03/27/2014 1516   HCT 43.0 03/27/2014 1516   PLT 186 03/27/2014 1516   MCV 91.3 03/27/2014 1516   MCH 29.5 03/27/2014 1516   MCHC 32.3 03/27/2014 1516   RDW 15.2 03/27/2014 1516   LYMPHSABS 2.4 06/26/2012 1137   MONOABS 0.4 06/26/2012 1137   EOSABS 0.1 06/26/2012 1137   BASOSABS 0.0 06/26/2012 1137    Troponin < 0.30 X 3  Lipid Panel     Component Value Date/Time   CHOL 160 03/28/2014 0414   TRIG 51 03/28/2014 0414   HDL 53 03/28/2014 0414   CHOLHDL 3.0 03/28/2014 0414   VLDL 10 03/28/2014 0414   LDLCALC 97 03/28/2014 0414   CHEST 2 VIEW  COMPARISON: February 20, 2010  FINDINGS:  There is underlying emphysematous change. There is minimal scarring  in the bases. Elsewhere lungs are clear. Heart is upper normal in  size with pulmonary vascularity within normal limits. No adenopathy.  There is degenerative change in the thoracic spine. Patient is  status post coronary artery bypass grafting.  IMPRESSION:  Emphysematous change with minimal bibasilar scarring. No edema or  consolidation  MYOCARDIAL IMAGING WITH SPECT (REST AND PHARMACOLOGIC-STRESS)  GATED LEFT VENTRICULAR WALL MOTION STUDY  LEFT VENTRICULAR EJECTION FRACTION  TECHNIQUE:  Standard myocardial SPECT imaging was performed after resting  intravenous injection of 10 mCi Tc-36m sestamibi. Subsequently,  intravenous infusion of Lexiscan was performed under the supervision  of the Cardiology staff. At peak effect of the drug, 30 mCi Tc-49m  sestamibi was injected intravenously and standard myocardial SPECT  imaging was performed. Quantitative gated imaging was also performed  to evaluate left ventricular wall motion, and estimate left  ventricular ejection fraction.  COMPARISON: None.  FINDINGS:  Baseline EKG showed NSR with  RBBB. No ST changes from baseline  during infusion with occasional PAC's and PVC's. Patient complained  of mild CP. Spect images showed normal myocardial perfusion in all  areas. No ischemia. Normal LVF EF 66%    Discharge Exam: Blood pressure 148/86, pulse 96, temperature 98 F (36.7 C), temperature source Oral, resp. rate 16, height 5\' 9"  (1.753 m), weight 156 lb 3.2 oz (70.852 kg), SpO2 99.00%.   Disposition: 01-Home or Self Care     Medication List         aspirin 81 MG tablet  Take 81 mg by mouth daily.     carvedilol 6.25 MG tablet  Commonly known as:  COREG  Take 3.125 mg by mouth 2 (two) times daily with a meal.     folic acid 1 MG tablet  Commonly known as:  FOLVITE  Take 1 mg by mouth daily.     lisinopril 5 MG tablet  Commonly known as:  PRINIVIL,ZESTRIL  Take 5 mg by mouth 2 (two) times daily.     LORazepam 1 MG tablet  Commonly known as:  ATIVAN  Take 1 mg by mouth 2 (two) times daily.     multivitamin capsule  Take 1 capsule by mouth daily.     naproxen sodium 220 MG tablet  Commonly known as:  ANAPROX  Take 220 mg by mouth 2 (two) times daily with a meal.     nitroGLYCERIN 0.4 MG SL tablet  Commonly known as:  NITROSTAT  Place 0.4 mg under the tongue every 5 (five) minutes as needed.     pantoprazole 40 MG tablet  Commonly known as:  PROTONIX  Take 1 tablet (40 mg total) by mouth daily.     potassium chloride 10 MEQ tablet  Commonly known as:  K-DUR  Take 10 mEq by mouth daily.     rosuvastatin 20 MG tablet  Commonly known as:  CRESTOR  Take 20 mg by mouth daily.     tamsulosin 0.4 MG Caps capsule  Commonly known as:  FLOMAX  Take 0.4 mg by mouth daily.     vitamin B-12 50 MCG tablet  Commonly known as:  CYANOCOBALAMIN  Take 50 mcg by mouth 2 (two) times daily.       Follow-up Information   Follow up with Darden Amber., MD. (the office will call Monday with date and time.)    Specialty:  Cardiology   Contact information:    [address] [address] [phone]        Discharge Instructions: Heart healthy diet  Call if recurrent chest pain. We added Protonix to meds in case the symptoms were related to reflux   Signed: [place]: [facility] 03/28/2014, 5:45 PM  Time spent on discharge : 25 minutes.

## 2014-03-31 ENCOUNTER — Telehealth: Payer: Self-pay

## 2014-03-31 NOTE — Telephone Encounter (Signed)
Patient's wife called to get samples of crestor placed samples up front

## 2014-03-31 NOTE — Discharge Summary (Signed)
See full note.cdm 

## 2014-04-23 ENCOUNTER — Encounter: Payer: Self-pay | Admitting: Physician Assistant

## 2014-04-23 ENCOUNTER — Ambulatory Visit (INDEPENDENT_AMBULATORY_CARE_PROVIDER_SITE_OTHER): Payer: Medicare Other | Admitting: Physician Assistant

## 2014-04-23 VITALS — BP 158/98 | HR 64 | Ht 69.0 in | Wt 158.0 lb

## 2014-04-23 DIAGNOSIS — I10 Essential (primary) hypertension: Secondary | ICD-10-CM

## 2014-04-23 DIAGNOSIS — R079 Chest pain, unspecified: Secondary | ICD-10-CM

## 2014-04-23 DIAGNOSIS — E785 Hyperlipidemia, unspecified: Secondary | ICD-10-CM

## 2014-04-23 DIAGNOSIS — I2510 Atherosclerotic heart disease of native coronary artery without angina pectoris: Secondary | ICD-10-CM

## 2014-04-23 DIAGNOSIS — B029 Zoster without complications: Secondary | ICD-10-CM | POA: Insufficient documentation

## 2014-04-23 NOTE — Progress Notes (Signed)
Cardiology Office Note    Date:  04/23/2014   ID:  Alexander Farley, DOB 11-Mar-1933, MRN [id number]  PCP:  Simona Huh, MD  Cardiologist:  Dr. Liam Rogers      History of Present Illness: Alexander Farley is a 80 y.o. male with a hx of CAD s/p stent to prox and ostial LAD (extending into the LM) in 2006, paroxysmal atrial fibrillation, HTN, HL.  Patient was admitted 7/30-7/31 with chest discomfort. Cardiac enzymes are negative. He underwent inpatient Myoview study which was negative for ischemia.  No further workup was planned.  He returns for FU.    Patient broke out in a rash after DC c/w varicella zoster.  He completed anti-viral treatment.  He still has pain in his left chest.  He is now taking Gabapentin prn.  It is overall improved.  He denies dyspnea, orthopnea, PND, edema.  Denies syncope.  He brings in BPs from home and they are optimal (115-132/72-85).   Studies:  - LHC (2/11):  LAD with 70 and 80% ISR, D1 90%, CFX 30-40%, RCA 30-40% >>> CABG (L-LAD, S-OM)  - Echo (10/05):  EF 55-60%  - Carotid US (3/15):  Bilateral ICA 1-39%   Recent Labs/Images: 01/23/2014: ALT 23  03/27/2014: Creatinine 0.79; Hemoglobin 13.9; Potassium 4.4  03/28/2014: HDL Cholesterol by NMR 53; LDL (calc) 97   Dg Chest 2 View  03/27/2014   IMPRESSION: Emphysematous change with minimal bibasilar scarring. No edema or consolidation.   Electronically Signed   By: Lowella Grip M.D.   On: 03/27/2014 15:49   Nm Myocar Multi W/spect W/wall Motion / Ef  03/28/2014    IMPRESSION: Normal study with no ischemia.  LVF was normal with EF 66%.   Electronically Signed   By: Fransico Him   On: 03/28/2014 16:10     Wt Readings from Last 3 Encounters:  03/27/14 156 lb 3.2 oz (70.852 kg)  01/28/14 163 lb (73.936 kg)  11/17/13 167 lb 8.8 oz (76 kg)     Past Medical History  Diagnosis Date  . Paroxysmal atrial fibrillation   . Dyslipidemia   . Hypertension   . Anxiety   . ED (erectile dysfunction)   .  Palpitations   . Back pain   . Coronary artery disease     a.  hx stent to LAD;  b. LHC (2/11):  LAD with 70 and 80% ISR, D1 90%, CFX 30-40%, RCA 30-40% >>> CABG (L-LAD, S-OM)  . RBBB plus LA hemiblock   . Hx of echocardiogram      Echo (10/05):  EF 55-60%  . Carotid stenosis     Carotid US (3/15):  Bilateral ICA 1-39%  . Hx of cardiovascular stress test 02/2014    Normal study with no ischemia.  LVF was normal with EF 66%.  Marland Kitchen History of shingles     post herpetic neuralgia (L chest)    Current Outpatient Prescriptions  Medication Sig Dispense Refill  . aspirin 81 MG tablet Take 81 mg by mouth daily.       . carvedilol (COREG) 6.25 MG tablet Take 3.125 mg by mouth 2 (two) times daily with a meal.       . folic acid (FOLVITE) 1 MG tablet Take 1 mg by mouth daily.        Marland Kitchen lisinopril (PRINIVIL,ZESTRIL) 5 MG tablet Take 5 mg by mouth 2 (two) times daily.      Marland Kitchen LORazepam (ATIVAN) 1 MG tablet Take 1  mg by mouth 2 (two) times daily.        . Multiple Vitamin (MULTIVITAMIN) capsule Take 1 capsule by mouth daily.        . naproxen sodium (ANAPROX) 220 MG tablet Take 220 mg by mouth 2 (two) times daily with a meal.      . nitroGLYCERIN (NITROSTAT) 0.4 MG SL tablet Place 0.4 mg under the tongue every 5 (five) minutes as needed.        . pantoprazole (PROTONIX) 40 MG tablet Take 1 tablet (40 mg total) by mouth daily.  30 tablet  6  . potassium chloride (K-DUR) 10 MEQ tablet Take 10 mEq by mouth daily.      . rosuvastatin (CRESTOR) 20 MG tablet Take 20 mg by mouth daily.      . Tamsulosin HCl (FLOMAX) 0.4 MG CAPS Take 0.4 mg by mouth daily.        . vitamin B-12 (CYANOCOBALAMIN) 50 MCG tablet Take 50 mcg by mouth 2 (two) times daily.        No current facility-administered medications for this visit.     Allergies:   Zocor   Social History:  The patient  reports that he quit smoking about 29 years ago. He does not have any smokeless tobacco history on file. He reports that he does not drink  alcohol or use illicit drugs.   Family History:  The patient's family history includes Stroke in his father and mother.   ROS:  Please see the history of present illness.      All other systems reviewed and negative.   PHYSICAL EXAM: VS:  BP 158/98  Pulse 64  Ht 5\' 9"  (1.753 m)  Wt 158 lb (71.668 kg)  BMI 23.32 kg/m2 Well nourished, well developed, in no acute distress HEENT: normal Neck: no JVD Cardiac:  normal S1, S2; RRR; no murmur Lungs:  clear to auscultation bilaterally, no wheezing, rhonchi or rales Abd: soft, nontender, no hepatomegaly Ext: no edema Skin: warm and dryremnants of zoster rash noted on L chest Neuro:  CNs 2-12 intact, no focal abnormalities noted  EKG:  NSR, HR 64, LAFB, RBBB, no change from prior tracing     ASSESSMENT AND PLAN:  Chest pain, unspecified:  Pain is related to recent shingles outbreak.  Recent myoview was low risk.  No further cardiac workup.  Shingles:  He is having symptoms of post herpetic neuralgia.  I have encouraged him to continue taking Gabapentin prn and to call his PCP if his symptoms do not improve.   Coronary artery disease involving native coronary artery of native heart without angina pectoris:  Recent myoview low risk. Continue ASA, beta blocker, statin.   Essential hypertension:  BPs at home optimal. He tells me that he has white coat HTN.  Continue current Rx.  HLD (hyperlipidemia): Continue statin.  Recent LDL was ok (97).  Disposition:  FU with Dr. Liam Rogers in 07/2014 as planned.    Signed, Versie Starks, MHS 04/23/2014 8:48 AM    [facility] [place], [place], [place]  [address] Phone: [phone]; Fax: [phone]

## 2014-04-23 NOTE — Patient Instructions (Signed)
NO CHANGES WITH MEDICATIONS WERE MADE TODAY  KEEP YOUR FOLLOW UP WITH DR. Cathie Olden IN 07/2014

## 2014-05-12 ENCOUNTER — Telehealth: Payer: Self-pay

## 2014-05-12 NOTE — Telephone Encounter (Signed)
Patient called to get samples of crestor 20 mg placed at front desk

## 2014-08-04 ENCOUNTER — Telehealth: Payer: Self-pay | Admitting: *Deleted

## 2014-08-04 NOTE — Telephone Encounter (Signed)
Crestor samples placed at the front desk for patient. 

## 2014-08-15 ENCOUNTER — Encounter: Payer: Self-pay | Admitting: Cardiovascular Disease

## 2014-08-15 ENCOUNTER — Ambulatory Visit (INDEPENDENT_AMBULATORY_CARE_PROVIDER_SITE_OTHER): Payer: Medicare Other | Admitting: Cardiovascular Disease

## 2014-08-15 VITALS — BP 162/100 | HR 71 | Ht 69.0 in | Wt 159.1 lb

## 2014-08-15 DIAGNOSIS — I480 Paroxysmal atrial fibrillation: Secondary | ICD-10-CM

## 2014-08-15 DIAGNOSIS — I2510 Atherosclerotic heart disease of native coronary artery without angina pectoris: Secondary | ICD-10-CM

## 2014-08-15 DIAGNOSIS — I10 Essential (primary) hypertension: Secondary | ICD-10-CM

## 2014-08-15 MED ORDER — POTASSIUM CHLORIDE CRYS ER 20 MEQ PO TBCR: 20.0000 meq | EXTENDED_RELEASE_TABLET | Freq: Two times a day (BID) | ORAL | Status: DC

## 2014-08-15 MED ORDER — HYDROCHLOROTHIAZIDE 25 MG PO TABS: 25.0000 mg | ORAL_TABLET | Freq: Every day | ORAL | Status: DC

## 2014-08-15 NOTE — Progress Notes (Signed)
[name] Date of Birth  31-Oct-1932 [facility] [address]     [place], [place]  [address] [phone]  Fax  [phone]  Problem list: 1. Coronary artery disease- status post PTCA and stenting of the proximal LAD and later status post CABG 2. Intermittent atrial fibrillation 3. Dyslipidemia 4. Hypertension 5. Anxiety  History of Present Illness:  Pt is doing well.  He has occasional tingling sensation in his chest  His BP has been OK but is higher today.  He has taken his meds.  He's been keeping up with his blood pressure readings at home. His blood pressure readings are all in the normal range.  He has not been watching his salt as closely as he should.  A home health nurse came out to visit him. She told him that he had a heart murmur. He's been very concerned about this new murmur.  Jan 25, 2013:  Alasdair is feeling well.  He has some mild orthostasis symptoms.   Nov. 17, 2014:  Zollie is feeling well.  BP is a bit high today.  His readings at home are all in the normal range.  No CP or dyspnea.  Exercising regularly with his dogs and also is going to the [facility] .  January 28, 2014:  Keagen is doing ok.  BP is elevated.  Cannot afford Zetia.  Suggested that he take 1/2 tablet.   Dec. 18, 2015:  Artemus is a 80 yo who we follow for CAD / CABG, paroxysmal atrial fib, HTN, hyperlipidemia. He was recently seen by Dr. Marisue Humble for HTN.  Lisinopril was increased from 5 BID to 10 BID. Eating a relatively low salt diet.      Current Outpatient Prescriptions on File Prior to Visit  Medication Sig Dispense Refill  . aspirin 81 MG tablet Take 81 mg by mouth daily.     . carvedilol (COREG) 6.25 MG tablet Take 3.125 mg by mouth 2 (two) times daily with a meal.     . folic acid (FOLVITE) 1 MG tablet Take 1 mg by mouth daily.      Marland Kitchen gabapentin (NEURONTIN) 100 MG capsule Take 100 mg by mouth 2 (two) times daily.    Marland Kitchen lisinopril  (PRINIVIL,ZESTRIL) 5 MG tablet Take 5 mg by mouth 2 (two) times daily.    Marland Kitchen LORazepam (ATIVAN) 1 MG tablet Take 1 mg by mouth 2 (two) times daily.      . Multiple Vitamin (MULTIVITAMIN) capsule Take 1 capsule by mouth daily.      . naproxen sodium (ANAPROX) 220 MG tablet Take 220 mg by mouth 2 (two) times daily with a meal.    . nitroGLYCERIN (NITROSTAT) 0.4 MG SL tablet Place 0.4 mg under the tongue every 5 (five) minutes as needed.      . pantoprazole (PROTONIX) 40 MG tablet Take 40 mg by mouth as needed.    . potassium chloride (K-DUR) 10 MEQ tablet Take 10 mEq by mouth daily.    . rosuvastatin (CRESTOR) 20 MG tablet Take 20 mg by mouth daily.    . Tamsulosin HCl (FLOMAX) 0.4 MG CAPS Take 0.4 mg by mouth daily.      . traMADol (ULTRAM) 50 MG tablet Take 50 mg by mouth every 6 (six) hours as needed.    . vitamin B-12 (CYANOCOBALAMIN) 50 MCG tablet Take 50 mcg by mouth 2 (two) times daily.      No current facility-administered medications  on file prior to visit.    Allergies  Allergen Reactions  . Zocor [Simvastatin] Other (See Comments)    Caused drop in blood pressure    Past Medical History  Diagnosis Date  . Paroxysmal atrial fibrillation   . Dyslipidemia   . Hypertension   . Anxiety   . ED (erectile dysfunction)   . Palpitations   . Back pain   . Coronary artery disease     a.  hx stent to LAD;  b. LHC (2/11):  LAD with 70 and 80% ISR, D1 90%, CFX 30-40%, RCA 30-40% >>> CABG (L-LAD, S-OM)  . RBBB plus LA hemiblock   . Hx of echocardiogram      Echo (10/05):  EF 55-60%  . Carotid stenosis     Carotid US (3/15):  Bilateral ICA 1-39%  . Hx of cardiovascular stress test 02/2014    Normal study with no ischemia.  LVF was normal with EF 66%.  Marland Kitchen History of shingles     post herpetic neuralgia (L chest)    Past Surgical History  Procedure Laterality Date  . Cardiac catheterization  10/12/2009    NORMAL LEFT VENTRICULAR SYSTOLIC FUNCTION. EF 60-65%  . Coronary angioplasty  with stent placement    . Coronary artery bypass graft      FEb. 2011  . Eye surgery  02-07-11    Right Eye    History  Smoking status  . Former Smoker  . Quit date: 01/30/1985  Smokeless tobacco  . Not on file    History  Alcohol Use No    Family History  Problem Relation Age of Onset  . Stroke Mother   . Stroke Father   . Heart attack Neg Hx     Reviw of Systems:  Reviewed in the HPI.  All other systems are negative.  Physical Exam: There were no vitals taken for this visit. The patient is alert and oriented x 3.  The mood and affect are normal.   Skin: warm and dry.  Color is normal.    HEENT:   the sclera are nonicteric.  The mucous membranes are moist.  The carotids are 2+ without bruits.  There is no thyromegaly.  There is no JVD.    Lungs: clear.  The chest wall is non tender.    Heart: regular rate with a normal S1 and split S2.  There are no significant murmurs, gallops, or rubs. The PMI is not displaced.     Abdomen: good bowel sounds.  There is no guarding or rebound.  There is no hepatosplenomegaly or tenderness.  There are no masses.   Extremities:  no clubbing, cyanosis, or edema.  The legs are without rashes.  The distal pulses are intact.   Neuro:  Cranial nerves II - XII are intact.  Motor and sensory functions are intact.    The gait is normal.  ECG:    Assessment / Plan:

## 2014-08-15 NOTE — Patient Instructions (Signed)
Start HCTZ (hydrochlorothiazide) 25mg  daily.   Increase KCL(potassium) to 20 mEq daily. You can take 2 of our 10 mEq tablets daily and use your current supply.  Your physician recommends that you return for lab work in: about 3 weeks--BMET.   Your physician recommends that you schedule a follow-up appointment in: 3 months with Dr Acie Fredrickson.

## 2014-08-15 NOTE — Assessment & Plan Note (Signed)
His BP has been elevated.  Will add HCTZ 25 a day. Increase kdur to 20.  BMP in 3 - 4 weeks. Office visit in 3 months.

## 2014-08-15 NOTE — Assessment & Plan Note (Signed)
No angina. Doing well. Continue current meds.

## 2014-08-20 ENCOUNTER — Other Ambulatory Visit: Payer: Self-pay | Admitting: Cardiovascular Disease

## 2014-09-05 ENCOUNTER — Other Ambulatory Visit (INDEPENDENT_AMBULATORY_CARE_PROVIDER_SITE_OTHER): Payer: Medicare Other | Admitting: *Deleted

## 2014-09-05 DIAGNOSIS — I10 Essential (primary) hypertension: Secondary | ICD-10-CM

## 2014-09-05 LAB — BASIC METABOLIC PANEL
BUN: 23 mg/dL (ref 6–23)
CALCIUM: 9.1 mg/dL (ref 8.4–10.5)
CHLORIDE: 107 meq/L (ref 96–112)
CO2: 27 mEq/L (ref 19–32)
CREATININE: 1 mg/dL (ref 0.4–1.5)
GFR: 93.71 mL/min (ref >60.00)
Glucose, Bld: 96 mg/dL (ref 70–99)
Potassium: 5.2 mEq/L — ABNORMAL HIGH (ref 3.5–5.1)
Sodium: 139 mEq/L (ref 135–145)

## 2014-09-08 ENCOUNTER — Telehealth: Payer: Self-pay | Admitting: Nurse Practitioner

## 2014-09-08 DIAGNOSIS — I10 Essential (primary) hypertension: Secondary | ICD-10-CM

## 2014-09-08 MED ORDER — POTASSIUM CHLORIDE CRYS ER 20 MEQ PO TBCR: 20.0000 meq | EXTENDED_RELEASE_TABLET | Freq: Every day | ORAL | Status: DC

## 2014-09-08 NOTE — Telephone Encounter (Signed)
-----   Message from Thayer Headings, MD sent at 09/05/2014  6:21 PM EST ----- Reduce Kdur to 10 a day Recheck in 3 weeks.

## 2014-09-08 NOTE — Telephone Encounter (Signed)
Spoke with patient to review lab results and advise of Dr. Elmarie Shiley orders to reduce KDur to 10 meq daily and repeat bmet in 3 weeks.  Patient scheduled for lab appointment 1/29.  I corrected patient's med list to reflect Dr. Elmarie Shiley Rx for KDur 20 meq once daily from last office visit on 12/18 (medication had been entered as 20 meq BID - patient only taking 20 meq daily).  Patient verbalized understanding and agreement to take KDur 10 meq once daily until after follow-up bmet on 1/29.

## 2014-09-26 ENCOUNTER — Other Ambulatory Visit (INDEPENDENT_AMBULATORY_CARE_PROVIDER_SITE_OTHER): Payer: Medicare Other | Admitting: *Deleted

## 2014-09-26 DIAGNOSIS — I10 Essential (primary) hypertension: Secondary | ICD-10-CM

## 2014-09-26 LAB — BASIC METABOLIC PANEL
BUN: 21 mg/dL (ref 6–23)
CO2: 28 mEq/L (ref 19–32)
CREATININE: 1.02 mg/dL (ref 0.40–1.50)
Calcium: 9 mg/dL (ref 8.4–10.5)
Chloride: 105 mEq/L (ref 96–112)
GFR: 90.53 mL/min (ref >60.00)
GLUCOSE: 103 mg/dL — AB (ref 70–99)
Potassium: 3.9 mEq/L (ref 3.5–5.1)
Sodium: 138 mEq/L (ref 135–145)

## 2014-09-26 NOTE — Addendum Note (Signed)
Addended by: Eulis Foster on: 09/26/2014 10:05 AM   Modules accepted: Orders

## 2014-09-28 ENCOUNTER — Other Ambulatory Visit: Payer: Self-pay | Admitting: Cardiology

## 2014-09-29 NOTE — Telephone Encounter (Signed)
Rx refill sent to patient pharmacy   

## 2014-10-03 ENCOUNTER — Telehealth: Payer: Self-pay | Admitting: Nurse Practitioner

## 2014-10-03 NOTE — Telephone Encounter (Signed)
Left message for patient that Dr. Acie Fredrickson has reviewed the BP/pulse log that he dropped off earlier this week.  I advised in the message that Dr. Acie Fredrickson is pleased with the blood pressure readings and patient does not need to make any changes in current therapy. Systolic blood pressure ranges from 90's to 150's with lowest measurement of 84/59 on one occasion Diastolic blood pressure ranges from 50's to 90's  I advised patient to call office with questions or concerns

## 2014-10-24 ENCOUNTER — Other Ambulatory Visit: Payer: Self-pay | Admitting: Cardiology

## 2014-11-14 ENCOUNTER — Ambulatory Visit (INDEPENDENT_AMBULATORY_CARE_PROVIDER_SITE_OTHER): Payer: Medicare Other | Admitting: Cardiovascular Disease

## 2014-11-14 ENCOUNTER — Encounter: Payer: Self-pay | Admitting: Cardiovascular Disease

## 2014-11-14 VITALS — BP 136/90 | HR 72 | Ht 69.0 in | Wt 162.2 lb

## 2014-11-14 DIAGNOSIS — I10 Essential (primary) hypertension: Secondary | ICD-10-CM

## 2014-11-14 DIAGNOSIS — E785 Hyperlipidemia, unspecified: Secondary | ICD-10-CM

## 2014-11-14 DIAGNOSIS — I2510 Atherosclerotic heart disease of native coronary artery without angina pectoris: Secondary | ICD-10-CM

## 2014-11-14 MED ORDER — SILDENAFIL CITRATE 20 MG PO TABS: ORAL_TABLET | ORAL | Status: DC

## 2014-11-14 MED ORDER — ROSUVASTATIN CALCIUM 20 MG PO TABS: 20.0000 mg | ORAL_TABLET | Freq: Every day | ORAL | Status: DC

## 2014-11-14 NOTE — Progress Notes (Signed)
Cardiology Office Note   Date:  11/14/2014   ID:  Alexander Farley, DOB December 05, 1932, MRN [id number]  PCP:  Simona Huh, MD  Cardiologist:   Thayer Headings, MD   Chief Complaint  Patient presents with  . Coronary Artery Disease   1. Coronary artery disease- status post PTCA and stenting of the proximal LAD and later status post CABG 2. Intermittent atrial fibrillation 3. Dyslipidemia 4. Hypertension 5. Anxiety  History of Present Illness:  Pt is doing well. He has occasional tingling sensation in his chest  His BP has been OK but is higher today. He has taken his meds. He's been keeping up with his blood pressure readings at home. His blood pressure readings are all in the normal range.  He has not been watching his salt as closely as he should.  A home health nurse came out to visit him. She told him that he had a heart murmur. He's been very concerned about this new murmur.  Jan 25, 2013:  Alexander Farley is feeling well. He has some mild orthostasis symptoms.   Nov. 17, 2014:  Alexander Farley is feeling well. BP is a bit high today. His readings at home are all in the normal range. No CP or dyspnea. Exercising regularly with his dogs and also is going to the [facility] .  January 28, 2014:  Alexander Farley is doing ok. BP is elevated. Cannot afford Zetia. Suggested that he take 1/2 tablet.   Dec. 18, 2015:  Alexander Farley is a 81 yo who we follow for CAD / CABG, paroxysmal atrial fib, HTN, hyperlipidemia. He was recently seen by Dr. Marisue Humble for HTN. Lisinopril was increased from 5 BID to 10 BID. Eating a relatively low salt diet.     November 14, 2014:  Alexander Farley is a 81 y.o. male who presents for CAD and HTn We added hydrochlorothiazide at his last visit. His blood pressure readings have been great. He is doing well. Having some issues with ED.   Past Medical History  Diagnosis Date  . Paroxysmal atrial fibrillation   . Dyslipidemia   . Hypertension   . Anxiety   . ED (erectile  dysfunction)   . Palpitations   . Back pain   . Coronary artery disease     a.  hx stent to LAD;  b. LHC (2/11):  LAD with 70 and 80% ISR, D1 90%, CFX 30-40%, RCA 30-40% >>> CABG (L-LAD, S-OM)  . RBBB plus LA hemiblock   . Hx of echocardiogram      Echo (10/05):  EF 55-60%  . Carotid stenosis     Carotid US (3/15):  Bilateral ICA 1-39%  . Hx of cardiovascular stress test 02/2014    Normal study with no ischemia.  LVF was normal with EF 66%.  Marland Kitchen History of shingles     post herpetic neuralgia (L chest)    Past Surgical History  Procedure Laterality Date  . Cardiac catheterization  10/12/2009    NORMAL LEFT VENTRICULAR SYSTOLIC FUNCTION. EF 60-65%  . Coronary angioplasty with stent placement    . Coronary artery bypass graft      FEb. 2011  . Eye surgery  02-07-11    Right Eye     Current Outpatient Prescriptions  Medication Sig Dispense Refill  . aspirin 81 MG tablet Take 81 mg by mouth daily.     . carvedilol (COREG) 3.125 MG tablet Take 3.125 mg by mouth 2 (two) times daily with a meal.    .  Cyanocobalamin (B-12) 1000 MCG TBCR Take by mouth daily.    . folic acid (FOLVITE) 1 MG tablet Take 1 mg by mouth daily.      . hydrochlorothiazide (HYDRODIURIL) 25 MG tablet Take 1 tablet (25 mg total) by mouth daily. 30 tablet 3  . lisinopril (PRINIVIL,ZESTRIL) 10 MG tablet Take 1 tablet (10 mg total) by mouth 2 (two) times daily.    Marland Kitchen LORazepam (ATIVAN) 1 MG tablet Take 1 mg by mouth 2 (two) times daily.      . Multiple Vitamin (MULTIVITAMIN) capsule Take 1 capsule by mouth daily.      . naproxen sodium (ANAPROX) 220 MG tablet Take 220 mg by mouth 2 (two) times daily with a meal. prn    . nitroGLYCERIN (NITROSTAT) 0.4 MG SL tablet Place 0.4 mg under the tongue every 5 (five) minutes as needed.      . pantoprazole (PROTONIX) 40 MG tablet TAKE 1 TABLET (40 MG TOTAL) BY MOUTH DAILY. 30 tablet 10  . potassium chloride SA (K-DUR,KLOR-CON) 20 MEQ tablet Take 1 tablet (20 mEq total) by mouth  daily. (Patient taking differently: Take 10 mEq by mouth daily. ) 31 tablet 11  . rosuvastatin (CRESTOR) 20 MG tablet Take 20 mg by mouth daily.    . Tamsulosin HCl (FLOMAX) 0.4 MG CAPS Take 0.4 mg by mouth daily.       No current facility-administered medications for this visit.    Allergies:   Zocor    Social History:  The patient  reports that he quit smoking about 29 years ago. He does not have any smokeless tobacco history on file. He reports that he does not drink alcohol or use illicit drugs.   Family History:  The patient's family history includes Stroke in his father and mother. There is no history of Heart attack.    ROS:  Please see the history of present illness.    Review of Systems: Constitutional:  denies fever, chills, diaphoresis, appetite change and fatigue.  HEENT: denies photophobia, eye pain, redness, hearing loss, ear pain, congestion, sore throat, rhinorrhea, sneezing, neck pain, neck stiffness and tinnitus.  Respiratory: denies SOB, DOE, cough, chest tightness, and wheezing.  Cardiovascular: denies chest pain, palpitations and leg swelling.  Gastrointestinal: denies nausea, vomiting, abdominal pain, diarrhea, constipation, blood in stool.  Genitourinary: denies dysuria, urgency, frequency, hematuria, flank pain and difficulty urinating.  Musculoskeletal: denies  myalgias, back pain, joint swelling, arthralgias and gait problem.   Skin: denies pallor, rash and wound.  Neurological: denies dizziness, seizures, syncope, weakness, light-headedness, numbness and headaches.   Hematological: denies adenopathy, easy bruising, personal or family bleeding history.  Psychiatric/ Behavioral: denies suicidal ideation, mood changes, confusion, nervousness, sleep disturbance and agitation.       All other systems are reviewed and negative.    PHYSICAL EXAM: VS:  BP 136/90 mmHg  Pulse 72  Ht 5\' 9"  (1.753 m)  Wt 162 lb 3.2 oz (73.573 kg)  BMI 23.94 kg/m2  SpO2 99% ,  BMI Body mass index is 23.94 kg/(m^2). GEN: Well nourished, well developed, in no acute distress HEENT: normal Neck: no JVD, carotid bruits, or masses Cardiac: RRR; no murmurs, rubs, or gallops,no edema  Respiratory:  clear to auscultation bilaterally, normal work of breathing GI: soft, nontender, nondistended, + BS MS: no deformity or atrophy Skin: warm and dry, no rash Neuro:  Strength and sensation are intact Psych: normal   EKG:  EKG is not ordered today.    Recent Labs: 01/23/2014:  ALT 23 03/27/2014: Hemoglobin 13.9; Platelets 186 09/26/2014: BUN 21; Creatinine 1.02; Potassium 3.9; Sodium 138    Lipid Panel    Component Value Date/Time   CHOL 160 03/28/2014 0414   TRIG 51 03/28/2014 0414   HDL 53 03/28/2014 0414   CHOLHDL 3.0 03/28/2014 0414   VLDL 10 03/28/2014 0414   LDLCALC 97 03/28/2014 0414      Wt Readings from Last 3 Encounters:  11/14/14 162 lb 3.2 oz (73.573 kg)  08/15/14 159 lb 1.9 oz (72.176 kg)  04/23/14 158 lb (71.668 kg)      Other studies Reviewed: Additional studies/ records that were reviewed today include: . Review of the above records demonstrates:    ASSESSMENT AND PLAN:  1. Coronary artery disease- status post PTCA and stenting of the proximal LAD and later status post CABG- he's doing well. He's not having any angina.  2. Intermittent atrial fibrillation - remains in normal sinus rhythm.  3. Dyslipidemia - will check fasting labs at his next office visit.  4. Hypertension - his blood pressure is very well-controlled on the current medications. Continue HCTZ and his other medicines.  5. Anxiety   Current medicines are reviewed at length with the patient today.  The patient does not have concerns regarding medicines.  The following changes have been made:  no change  Labs/ tests ordered today include:  No orders of the defined types were placed in this encounter.     Disposition:   FU with me in 6 months      Signed, Nahser, Wonda Cheng, MD  11/14/2014 8:30 AM    [facility] [place], [place], [place]  [address] Phone: [phone]; Fax: [phone]

## 2014-11-14 NOTE — Patient Instructions (Addendum)
Your physician has recommended you make the following change in your medication:  Take Sildenafil 20 mg 1-5 tablets as needed  Your physician wants you to follow-up in: 6 months with Dr. Acie Fredrickson.  You will receive a reminder letter in the mail two months in advance. If you don't receive a letter, please call our office to schedule the follow-up appointment. Your physician recommends that you return for lab work in: 6 months on the day of or a few days before your office visit with Dr. Acie Fredrickson.  You will need to FAST for this appointment - nothing to eat or drink after midnight the night before except water.

## 2014-11-24 ENCOUNTER — Telehealth: Payer: Self-pay | Admitting: Cardiovascular Disease

## 2014-11-24 NOTE — Telephone Encounter (Signed)
New message     Pt c/o medication issue:  1. Name of Medication: crestor 2. How are you currently taking this medication (dosage and times per day)? 20mg   3. Are you having a reaction (difficulty breathing--STAT)? no 4. What is your medication issue? Pt cannot afford medication.  Is there something else he can take?

## 2014-11-24 NOTE — Telephone Encounter (Signed)
Routing to Dr. Acie Fredrickson for advice on Atorvastatin or other agent and dosage

## 2014-11-25 MED ORDER — ATORVASTATIN CALCIUM 80 MG PO TABS: 80.0000 mg | ORAL_TABLET | Freq: Every day | ORAL | Status: DC

## 2014-11-25 NOTE — Telephone Encounter (Signed)
Spoke with patient and advised him of documented reaction to Zocor, but no documented reaction to Atorvastatin.  Patient states he cannot recall specifically and is willing to try Atorvastatin again.  I advised patient that I have sent Rx to patient's pharmacy and to call me if he has any side effects, questions or concerns.  Patient verbalized understanding and agreement.

## 2014-11-25 NOTE — Telephone Encounter (Signed)
Will try Atorvastatin 80 a day DC crestor Check fasting lipids , BMP, liver enz in 3 months

## 2014-11-25 NOTE — Telephone Encounter (Signed)
Spoke with patient and reviewed Dr. Elmarie Shiley advice with him.  Patient states he thinks he had a bad reaction to Atorvastatin in the past.  I have reviewed patient's chart and discussed with Dr. Acie Fredrickson.  I see where patient had drop in BP while on Zocor, however I cannot find documentation of problem with Atorvastatin.  Dr.  Acie Fredrickson recommends patient try Atorvastatin 80 mg again.

## 2014-12-04 ENCOUNTER — Other Ambulatory Visit: Payer: Self-pay | Admitting: Cardiovascular Disease

## 2014-12-04 NOTE — Telephone Encounter (Signed)
Thayer Headings, MD at 11/14/2014 8:29 AM  hydrochlorothiazide (HYDRODIURIL) 25 MG tabletTake 1 tablet (25 mg total) by mouth daily Patient Instructions     Your physician has recommended you make the following change in your medication:  Take Sildenafil 20 mg 1-5 tablets as needed

## 2015-05-13 ENCOUNTER — Other Ambulatory Visit (INDEPENDENT_AMBULATORY_CARE_PROVIDER_SITE_OTHER): Payer: Medicare Other | Admitting: *Deleted

## 2015-05-13 ENCOUNTER — Encounter: Payer: Self-pay | Admitting: Cardiovascular Disease

## 2015-05-13 ENCOUNTER — Ambulatory Visit (INDEPENDENT_AMBULATORY_CARE_PROVIDER_SITE_OTHER): Payer: Medicare Other | Admitting: Cardiovascular Disease

## 2015-05-13 VITALS — BP 160/100 | HR 65 | Ht 69.0 in | Wt 163.0 lb

## 2015-05-13 DIAGNOSIS — I10 Essential (primary) hypertension: Secondary | ICD-10-CM | POA: Diagnosis not present

## 2015-05-13 DIAGNOSIS — E785 Hyperlipidemia, unspecified: Secondary | ICD-10-CM | POA: Diagnosis not present

## 2015-05-13 DIAGNOSIS — I2510 Atherosclerotic heart disease of native coronary artery without angina pectoris: Secondary | ICD-10-CM | POA: Diagnosis not present

## 2015-05-13 LAB — HEPATIC FUNCTION PANEL
ALK PHOS: 68 U/L (ref 39–117)
ALT: 30 U/L (ref 0–53)
AST: 27 U/L (ref 0–37)
Albumin: 3.9 g/dL (ref 3.5–5.2)
Bilirubin, Direct: 0.2 mg/dL (ref 0.0–0.3)
TOTAL PROTEIN: 6.8 g/dL (ref 6.0–8.3)
Total Bilirubin: 0.7 mg/dL (ref 0.2–1.2)

## 2015-05-13 LAB — BASIC METABOLIC PANEL
BUN: 14 mg/dL (ref 6–23)
CO2: 27 mEq/L (ref 19–32)
Calcium: 9.1 mg/dL (ref 8.4–10.5)
Chloride: 106 mEq/L (ref 96–112)
Creatinine, Ser: 0.86 mg/dL (ref 0.40–1.50)
GFR: 110.05 mL/min (ref >60.00)
Glucose, Bld: 95 mg/dL (ref 70–99)
Potassium: 4 mEq/L (ref 3.5–5.1)
SODIUM: 140 meq/L (ref 135–145)

## 2015-05-13 LAB — LIPID PANEL
Cholesterol: 140 mg/dL (ref 0–200)
HDL: 53.8 mg/dL (ref >39.00)
LDL Cholesterol: 78 mg/dL (ref 0–99)
NonHDL: 86.12
Total CHOL/HDL Ratio: 3
Triglycerides: 41 mg/dL (ref 0.0–149.0)
VLDL: 8.2 mg/dL (ref 0.0–40.0)

## 2015-05-13 NOTE — Patient Instructions (Addendum)
Medication Instructions:  Your physician recommends that you continue on your current medications as directed. Please refer to the Current Medication list given to you today.   Labwork: Your physician recommends that you return for lab work in: 6 months on the day of or a few days before your office visit with Dr. Acie Fredrickson.  You will need to FAST for this appointment - nothing to eat or drink after midnight the night before except water.   Testing/Procedures: None Ordered   Follow-Up: Your physician wants you to follow-up in: 6 months with Dr. Acie Fredrickson.  You will receive a reminder letter in the mail two months in advance. If you don't receive a letter, please call our office to schedule the follow-up appointment.   **Bring your blood pressure cuff to your next office visit so that we can check it

## 2015-05-13 NOTE — Progress Notes (Signed)
Cardiology Office Note   Date:  05/13/2015   ID:  Alexander Farley, DOB 1932-10-12, MRN [id number]  PCP:  Simona Huh, MD  Cardiologist:   Thayer Headings, MD   Chief Complaint  Patient presents with  . Coronary Artery Disease   1. Coronary artery disease- status post PTCA and stenting of the proximal LAD and later status post CABG 2. Intermittent atrial fibrillation 3. Dyslipidemia 4. Hypertension 5. Anxiety  History of Present Illness:  Pt is doing well. He has occasional tingling sensation in his chest  His BP has been OK but is higher today. He has taken his meds. He's been keeping up with his blood pressure readings at home. His blood pressure readings are all in the normal range.  He has not been watching his salt as closely as he should.  A home health nurse came out to visit him. She told him that he had a heart murmur. He's been very concerned about this new murmur.  Jan 25, 2013:  Alexander Farley is feeling well. He has some mild orthostasis symptoms.   Nov. 17, 2014:  Alexander Farley is feeling well. BP is a bit high today. His readings at home are all in the normal range. No CP or dyspnea. Exercising regularly with his dogs and also is going to the [facility] .  January 28, 2014:  Alexander Farley is doing ok. BP is elevated. Cannot afford Zetia. Suggested that he take 1/2 tablet.   Dec. 18, 2015:  Alexander Farley is a 81 yo who we follow for CAD / CABG, paroxysmal atrial fib, HTN, hyperlipidemia. He was recently seen by Dr. Marisue Humble for HTN. Lisinopril was increased from 5 BID to 10 BID. Eating a relatively low salt diet.     November 14, 2014:  Alexander Farley is a 81 y.o. male who presents for CAD and HTn We added hydrochlorothiazide at his last visit. His blood pressure readings have been great. He is doing well. Having some issues with ED.   Past Medical History  Diagnosis Date  . Paroxysmal atrial fibrillation   . Dyslipidemia   . Hypertension   . Anxiety   . ED (erectile  dysfunction)   . Palpitations   . Back pain   . Coronary artery disease     a.  hx stent to LAD;  b. LHC (2/11):  LAD with 70 and 80% ISR, D1 90%, CFX 30-40%, RCA 30-40% >>> CABG (L-LAD, S-OM)  . RBBB plus LA hemiblock   . Hx of echocardiogram      Echo (10/05):  EF 55-60%  . Carotid stenosis     Carotid US (3/15):  Bilateral ICA 1-39%  . Hx of cardiovascular stress test 02/2014    Normal study with no ischemia.  LVF was normal with EF 66%.  Marland Kitchen History of shingles     post herpetic neuralgia (L chest)    Past Surgical History  Procedure Laterality Date  . Cardiac catheterization  10/12/2009    NORMAL LEFT VENTRICULAR SYSTOLIC FUNCTION. EF 60-65%  . Coronary angioplasty with stent placement    . Coronary artery bypass graft      FEb. 2011  . Eye surgery  02-07-11    Right Eye     Current Outpatient Prescriptions  Medication Sig Dispense Refill  . aspirin 81 MG tablet Take 81 mg by mouth daily.     Marland Kitchen atorvastatin (LIPITOR) 80 MG tablet Take 1 tablet (80 mg total) by mouth daily. 31 tablet 11  .  carvedilol (COREG) 3.125 MG tablet Take 3.125 mg by mouth 2 (two) times daily with a meal.    . Cyanocobalamin (B-12) 1000 MCG TBCR Take 1,000 mcg by mouth daily.     . folic acid (FOLVITE) 1 MG tablet Take 1 mg by mouth daily.      Marland Kitchen lisinopril (PRINIVIL,ZESTRIL) 10 MG tablet Take 1 tablet (10 mg total) by mouth 2 (two) times daily.    Marland Kitchen LORazepam (ATIVAN) 1 MG tablet Take 1 mg by mouth 2 (two) times daily.      . Multiple Vitamin (MULTIVITAMIN) capsule Take 1 capsule by mouth daily.      . nitroGLYCERIN (NITROSTAT) 0.4 MG SL tablet Place 0.4 mg under the tongue every 5 (five) minutes as needed for chest pain.     . sildenafil (REVATIO) 20 MG tablet 1-5 tabs as needed (Patient taking differently: Take 20 mg by mouth daily as needed (for ED). 1-5 tabs as needed) 50 tablet 6  . Tamsulosin HCl (FLOMAX) 0.4 MG CAPS Take 0.4 mg by mouth daily.      . hydrochlorothiazide (HYDRODIURIL) 25 MG  tablet TAKE 1 TABLET (25 MG TOTAL) BY MOUTH DAILY. (Patient not taking: Reported on 05/13/2015) 90 tablet 3  . potassium chloride SA (K-DUR,KLOR-CON) 20 MEQ tablet Take 1 tablet (20 mEq total) by mouth daily. (Patient not taking: Reported on 05/13/2015) 31 tablet 11   No current facility-administered medications for this visit.    Allergies:   Zocor    Social History:  The patient  reports that he quit smoking about 30 years ago. He does not have any smokeless tobacco history on file. He reports that he does not drink alcohol or use illicit drugs.   Family History:  The patient's family history includes Stroke in his father and mother. There is no history of Heart attack.    ROS:  Please see the history of present illness.    Review of Systems: Constitutional:  denies fever, chills, diaphoresis, appetite change and fatigue.  HEENT: denies photophobia, eye pain, redness, hearing loss, ear pain, congestion, sore throat, rhinorrhea, sneezing, neck pain, neck stiffness and tinnitus.  Respiratory: denies SOB, DOE, cough, chest tightness, and wheezing.  Cardiovascular: denies chest pain, palpitations and leg swelling.  Gastrointestinal: denies nausea, vomiting, abdominal pain, diarrhea, constipation, blood in stool.  Genitourinary: denies dysuria, urgency, frequency, hematuria, flank pain and difficulty urinating.  Musculoskeletal: denies  myalgias, back pain, joint swelling, arthralgias and gait problem.   Skin: denies pallor, rash and wound.  Neurological: denies dizziness, seizures, syncope, weakness, light-headedness, numbness and headaches.   Hematological: denies adenopathy, easy bruising, personal or family bleeding history.  Psychiatric/ Behavioral: denies suicidal ideation, mood changes, confusion, nervousness, sleep disturbance and agitation.       All other systems are reviewed and negative.    PHYSICAL EXAM: VS:  BP 160/100 mmHg  Pulse 65  Ht 5\' 9"  (1.753 m)  Wt 73.936 kg  (163 lb)  BMI 24.06 kg/m2 , BMI Body mass index is 24.06 kg/(m^2). GEN: Well nourished, well developed, in no acute distress HEENT: normal Neck: no JVD, carotid bruits, or masses Cardiac: RRR; no murmurs, rubs, or gallops,no edema  Respiratory:  clear to auscultation bilaterally, normal work of breathing GI: soft, nontender, nondistended, + BS MS: no deformity or atrophy Skin: warm and dry, no rash Neuro:  Strength and sensation are intact Psych: normal   EKG:  EKG is ordered today. NSR at 65 with PACs.  LAHB, RBBB  Recent Labs: 09/26/2014: BUN 21; Creatinine, Ser 1.02; Potassium 3.9; Sodium 138    Lipid Panel    Component Value Date/Time   CHOL 160 03/28/2014 0414   TRIG 51 03/28/2014 0414   HDL 53 03/28/2014 0414   CHOLHDL 3.0 03/28/2014 0414   VLDL 10 03/28/2014 0414   LDLCALC 97 03/28/2014 0414      Wt Readings from Last 3 Encounters:  05/13/15 73.936 kg (163 lb)  11/14/14 73.573 kg (162 lb 3.2 oz)  08/15/14 72.176 kg (159 lb 1.9 oz)      Other studies Reviewed: Additional studies/ records that were reviewed today include: . Review of the above records demonstrates:    ASSESSMENT AND PLAN:  1. Coronary artery disease- status post PTCA and stenting of the proximal LAD and later status post CABG- he's doing well. He's not having any angina.  Continue current meds   2. Intermittent atrial fibrillation - remains in normal sinus rhythm.  3. Dyslipidemia - will check fasting labs today , crestor has been changed to Atrovastatin   4. Hypertension - his blood pressure is very well-controlled on the current medications.    The HCTZ was stopped by Dr. Marisue Humble.   His BP readings at home are OK.  Its a bit high here today  Will have him bring his blood pressure cuff to his next visit and will calibrated against our cuff here.   5. Anxiety   Current medicines are reviewed at length with the patient today.  The patient does not have concerns regarding  medicines.  The following changes have been made:  no change  Labs/ tests ordered today include:  No orders of the defined types were placed in this encounter.     Disposition:   FU with me in 6 months  . Will check fasting labs at that time     Signed, Nahser, Wonda Cheng, MD  05/13/2015 9:22 AM    [facility] [address], [place], [place]  [address] Phone: [phone]; Fax: [phone]

## 2015-06-23 ENCOUNTER — Other Ambulatory Visit: Payer: Self-pay | Admitting: *Deleted

## 2015-06-23 DIAGNOSIS — I10 Essential (primary) hypertension: Secondary | ICD-10-CM

## 2015-06-23 NOTE — Telephone Encounter (Signed)
Pt stated in his OV 05/13/15 he was not taking his Klor Con, now he is asking for a refill, i did not find were he had been removed from it by Dr Acie Fredrickson, pt said his PCP took him off it, will route to [facility] for further advice.

## 2015-06-24 MED ORDER — POTASSIUM CHLORIDE CRYS ER 20 MEQ PO TBCR: 20.0000 meq | EXTENDED_RELEASE_TABLET | Freq: Every day | ORAL | Status: DC

## 2015-06-24 NOTE — Addendum Note (Signed)
Addended by: Roberts Gaudy on: 06/24/2015 12:10 PM   Modules accepted: Orders

## 2015-11-23 ENCOUNTER — Encounter: Payer: Self-pay | Admitting: Cardiovascular Disease

## 2015-11-23 ENCOUNTER — Ambulatory Visit (INDEPENDENT_AMBULATORY_CARE_PROVIDER_SITE_OTHER): Payer: Medicare Other | Admitting: Cardiovascular Disease

## 2015-11-23 ENCOUNTER — Other Ambulatory Visit (INDEPENDENT_AMBULATORY_CARE_PROVIDER_SITE_OTHER): Payer: Medicare Other | Admitting: *Deleted

## 2015-11-23 VITALS — BP 110/72 | HR 70 | Ht 69.0 in | Wt 164.0 lb

## 2015-11-23 DIAGNOSIS — E785 Hyperlipidemia, unspecified: Secondary | ICD-10-CM

## 2015-11-23 DIAGNOSIS — I2510 Atherosclerotic heart disease of native coronary artery without angina pectoris: Secondary | ICD-10-CM

## 2015-11-23 DIAGNOSIS — I10 Essential (primary) hypertension: Secondary | ICD-10-CM

## 2015-11-23 LAB — HEPATIC FUNCTION PANEL
ALBUMIN: 3.8 g/dL (ref 3.6–5.1)
ALT: 22 U/L (ref 9–46)
AST: 22 U/L (ref 10–35)
Alkaline Phosphatase: 68 U/L (ref 40–115)
BILIRUBIN TOTAL: 0.5 mg/dL (ref 0.2–1.2)
Bilirubin, Direct: 0.2 mg/dL (ref <=0.2)
Indirect Bilirubin: 0.3 mg/dL (ref 0.2–1.2)
Total Protein: 6.4 g/dL (ref 6.1–8.1)

## 2015-11-23 LAB — LIPID PANEL
CHOL/HDL RATIO: 2.9 ratio (ref <=5.0)
Cholesterol: 152 mg/dL (ref 125–200)
HDL: 52 mg/dL (ref >=40)
LDL CALC: 90 mg/dL (ref <130)
TRIGLYCERIDES: 50 mg/dL (ref <150)
VLDL: 10 mg/dL (ref <30)

## 2015-11-23 LAB — BASIC METABOLIC PANEL
BUN: 15 mg/dL (ref 7–25)
CHLORIDE: 106 mmol/L (ref 98–110)
CO2: 27 mmol/L (ref 20–31)
Calcium: 9.2 mg/dL (ref 8.6–10.3)
Creat: 0.94 mg/dL (ref 0.70–1.11)
Glucose, Bld: 102 mg/dL — ABNORMAL HIGH (ref 65–99)
POTASSIUM: 4 mmol/L (ref 3.5–5.3)
SODIUM: 141 mmol/L (ref 135–146)

## 2015-11-23 NOTE — Patient Instructions (Signed)
Medication Instructions:  Your physician recommends that you continue on your current medications as directed. Please refer to the Current Medication list given to you today.   Labwork: TODAY - cholesterol, complete metabolic panel  Your physician recommends that you return for lab work in: 6 months on the day of or a few days before your office visit with Dr. Nahser.  You will need to FAST for this appointment - nothing to eat or drink after midnight the night before except water.   Testing/Procedures: None Ordered   Follow-Up: Your physician wants you to follow-up in: 6 months with Dr. Nahser.  You will receive a reminder letter in the mail two months in advance. If you don't receive a letter, please call our office to schedule the follow-up appointment.   If you need a refill on your cardiac medications before your next appointment, please call your pharmacy.   Thank you for choosing [facility]! Kym Fenter, RN [phone]    

## 2015-11-23 NOTE — Addendum Note (Signed)
Addended by: Eulis Foster on: 11/23/2015 08:47 AM   Modules accepted: Orders

## 2015-11-23 NOTE — Progress Notes (Signed)
Cardiology Office Note   Date:  11/23/2015   ID:  Alexander Farley, DOB [birth year], MRN [id number]  PCP:  Simona Huh, MD  Cardiologist:   Thayer Headings, MD   Chief Complaint  Patient presents with  . Follow-up    CAD, hyperlipidemia   1. Coronary artery disease- status post PTCA and stenting of the proximal LAD and later status post CABG 2. Intermittent atrial fibrillation 3. Dyslipidemia 4. Hypertension 5. Anxiety  History of Present Illness:  Pt is doing well. He has occasional tingling sensation in his chest  His BP has been OK but is higher today. He has taken his meds. He's been keeping up with his blood pressure readings at home. His blood pressure readings are all in the normal range.  He has not been watching his salt as closely as he should.  A home health nurse came out to visit him. She told him that he had a heart murmur. He's been very concerned about this new murmur.  Jan 25, 2013:  Alexander Farley is feeling well. He has some mild orthostasis symptoms.   Nov. 17, 2014:  Alexander Farley is feeling well. BP is a bit high today. His readings at home are all in the normal range. No CP or dyspnea. Exercising regularly with his dogs and also is going to the [facility] .  January 28, 2014:  Alexander Farley is doing ok. BP is elevated. Cannot afford Zetia. Suggested that he take 1/2 tablet.   Dec. 18, 2015:  Alexander Farley is a 82 yo who we follow for CAD / CABG, paroxysmal atrial fib, HTN, hyperlipidemia. He was recently seen by Dr. Marisue Humble for HTN. Lisinopril was increased from 5 BID to 10 BID. Eating a relatively low salt diet.     November 14, 2014:  Alexander Farley is a 82 y.o. male who presents for CAD and HTn We added hydrochlorothiazide at his last visit. His blood pressure readings have been great. He is doing well. Having some issues with ED.   November 23, 2015.  Doing great from a cardiac standpoint. Has had a cold / URI for the past month.   Past Medical History  Diagnosis  Date  . Paroxysmal atrial fibrillation (HCC)   . Dyslipidemia   . Hypertension   . Anxiety   . ED (erectile dysfunction)   . Palpitations   . Back pain   . Coronary artery disease     a.  hx stent to LAD;  b. LHC (2/11):  LAD with 70 and 80% ISR, D1 90%, CFX 30-40%, RCA 30-40% >>> CABG (L-LAD, S-OM)  . RBBB plus LA hemiblock   . Hx of echocardiogram      Echo (10/05):  EF 55-60%  . Carotid stenosis     Carotid US (3/15):  Bilateral ICA 1-39%  . Hx of cardiovascular stress test 02/2014    Normal study with no ischemia.  LVF was normal with EF 66%.  Marland Kitchen History of shingles     post herpetic neuralgia (L chest)    Past Surgical History  Procedure Laterality Date  . Cardiac catheterization  10/12/2009    NORMAL LEFT VENTRICULAR SYSTOLIC FUNCTION. EF 60-65%  . Coronary angioplasty with stent placement    . Coronary artery bypass graft      FEb. 2011  . Eye surgery  02-07-11    Right Eye     Current Outpatient Prescriptions  Medication Sig Dispense Refill  . aspirin 81 MG tablet Take 81 mg  by mouth daily.     Marland Kitchen atorvastatin (LIPITOR) 80 MG tablet Take 1 tablet (80 mg total) by mouth daily. 31 tablet 11  . carvedilol (COREG) 3.125 MG tablet Take 3.125 mg by mouth 2 (two) times daily with a meal.    . Cyanocobalamin (B-12) 1000 MCG TBCR Take 1,000 mcg by mouth daily.     . folic acid (FOLVITE) 1 MG tablet Take 1 mg by mouth daily.      . hydrochlorothiazide (HYDRODIURIL) 25 MG tablet TAKE 1 TABLET (25 MG TOTAL) BY MOUTH DAILY. 90 tablet 3  . lisinopril (PRINIVIL,ZESTRIL) 10 MG tablet Take 1 tablet (10 mg total) by mouth 2 (two) times daily.    Marland Kitchen LORazepam (ATIVAN) 1 MG tablet Take 1 mg by mouth 2 (two) times daily.      . Multiple Vitamin (MULTIVITAMIN) capsule Take 1 capsule by mouth daily.      . nitroGLYCERIN (NITROSTAT) 0.4 MG SL tablet Place 0.4 mg under the tongue every 5 (five) minutes as needed for chest pain.     . potassium chloride SA (K-DUR,KLOR-CON) 20 MEQ tablet Take 1  tablet (20 mEq total) by mouth daily. 31 tablet 11  . sildenafil (REVATIO) 20 MG tablet Take 20 mg by mouth as needed (for ED).    . Tamsulosin HCl (FLOMAX) 0.4 MG CAPS Take 0.4 mg by mouth daily.       No current facility-administered medications for this visit.    Allergies:   Zocor    Social History:  The patient  reports that he quit smoking about 30 years ago. He does not have any smokeless tobacco history on file. He reports that he does not drink alcohol or use illicit drugs.   Family History:  The patient's family history includes Stroke in his father and mother. There is no history of Heart attack.    ROS:  Please see the history of present illness.    Review of Systems: Constitutional:  denies fever, chills, diaphoresis, appetite change and fatigue.  HEENT: denies photophobia, eye pain, redness, hearing loss, ear pain, congestion, sore throat, rhinorrhea, sneezing, neck pain, neck stiffness and tinnitus.  Respiratory: denies SOB, DOE, cough, chest tightness, and wheezing.  Cardiovascular: denies chest pain, palpitations and leg swelling.  Gastrointestinal: denies nausea, vomiting, abdominal pain, diarrhea, constipation, blood in stool.  Genitourinary: denies dysuria, urgency, frequency, hematuria, flank pain and difficulty urinating.  Musculoskeletal: denies  myalgias, back pain, joint swelling, arthralgias and gait problem.   Skin: denies pallor, rash and wound.  Neurological: denies dizziness, seizures, syncope, weakness, light-headedness, numbness and headaches.   Hematological: denies adenopathy, easy bruising, personal or family bleeding history.  Psychiatric/ Behavioral: denies suicidal ideation, mood changes, confusion, nervousness, sleep disturbance and agitation.       All other systems are reviewed and negative.    PHYSICAL EXAM: VS:  BP 110/72 mmHg  Pulse 70  Ht 5\' 9"  (1.753 m)  Wt 164 lb (74.39 kg)  BMI 24.21 kg/m2 , BMI Body mass index is 24.21  kg/(m^2). GEN: Well nourished, well developed, in no acute distress HEENT: normal Neck: no JVD, carotid bruits, or masses Cardiac: RRR; no murmurs, rubs, or gallops,no edema  Respiratory:  clear to auscultation bilaterally, normal work of breathing GI: soft, nontender, nondistended, + BS MS: no deformity or atrophy Skin: warm and dry, no rash Neuro:  Strength and sensation are intact Psych: normal   EKG:  EKG is ordered today. NSR at 65 with PACs.  LAHB, RBBB  Recent Labs: 05/13/2015: ALT 30; BUN 14; Creatinine, Ser 0.86; Potassium 4.0; Sodium 140    Lipid Panel    Component Value Date/Time   CHOL 140 05/13/2015 0827   TRIG 41.0 05/13/2015 0827   HDL 53.80 05/13/2015 0827   CHOLHDL 3 05/13/2015 0827   VLDL 8.2 05/13/2015 0827   LDLCALC 78 05/13/2015 0827      Wt Readings from Last 3 Encounters:  11/23/15 164 lb (74.39 kg)  05/13/15 163 lb (73.936 kg)  11/14/14 162 lb 3.2 oz (73.573 kg)      Other studies Reviewed: Additional studies/ records that were reviewed today include: . Review of the above records demonstrates:    ASSESSMENT AND PLAN:  1. Coronary artery disease- status post PTCA and stenting of the proximal LAD and later status post CABG- he's doing well. He's not having any angina.  Continue current meds   2. Intermittent atrial fibrillation - remains in normal sinus rhythm.  3. Dyslipidemia - will check fasting labs today , crestor has been changed to Atrovastatin   4. Hypertension - his blood pressure is very well-controlled on the current medications.    The HCTZ was stopped by Dr. Marisue Humble.   His BP readings at home are OK.  Its a bit high here today  Will have him bring his blood pressure cuff to his next visit and will calibrated against our cuff here.    Current medicines are reviewed at length with the patient today.  The patient does not have concerns regarding medicines.  The following changes have been made:  no change  Labs/ tests  ordered today include:  No orders of the defined types were placed in this encounter.    Disposition:   FU with me in 6 months  . Will check fasting labs at that time    Thayer Headings, MD  11/23/2015 8:51 AM    [facility] [address], [place], [place]  [address] Phone: [phone]; Fax: [phone]

## 2015-11-23 NOTE — Addendum Note (Signed)
Addended by: Eulis Foster on: 11/23/2015 08:48 AM   Modules accepted: Orders

## 2015-11-24 ENCOUNTER — Telehealth: Payer: Self-pay | Admitting: Nurse Practitioner

## 2015-11-24 MED ORDER — ROSUVASTATIN CALCIUM 20 MG PO TABS: 20.0000 mg | ORAL_TABLET | Freq: Every day | ORAL | Status: DC

## 2015-11-24 NOTE — Telephone Encounter (Signed)
-----   Message from Thayer Headings, MD sent at 11/24/2015  6:17 AM EDT ----- CMET is good Lipids - LDL is 90 .   His goal is 38 He is currently on atorvastatin 80 a day - Lets DC atorva and start Rosuvastatin 20 mg a day Check lipids and cmet in 3 months

## 2015-11-24 NOTE — Telephone Encounter (Signed)
Reviewed lab results and plan of care with patient who verbalized understanding and agreement.  He had some concerns about the cost of rosuvastatin and I advised him to call back if there is a problem with the cost.  I advised that I will contact him regarding 3 month lab work.  He thanked me for the call.

## 2015-11-27 ENCOUNTER — Telehealth: Payer: Self-pay | Admitting: *Deleted

## 2015-11-27 MED ORDER — ATORVASTATIN CALCIUM 80 MG PO TABS: 80.0000 mg | ORAL_TABLET | Freq: Every day | ORAL | Status: DC

## 2015-11-27 NOTE — Telephone Encounter (Signed)
Spoke with patient who states he cannot afford the rosuvastatin.  I advised that per Dr. Acie Fredrickson, he may continue atorvastatin 80 mg daily.  I have sent a new Rx and cancelled the order for rosuvastatin.  Patient verbalized understanding and agreement and thanked me for the call.

## 2015-11-27 NOTE — Telephone Encounter (Signed)
I would prefer that he take the rosuvastatin but will settle for atorvastatin 80 if that is all he can afford

## 2015-11-27 NOTE — Telephone Encounter (Signed)
Patient called to report that he is not able to afford the rx for rosuvastatin at $45/month. He would like to know if he could go back on the atorvastatin or something else that would be more affordable. Please advise. Thanks, MI

## 2015-12-18 ENCOUNTER — Other Ambulatory Visit: Payer: Self-pay | Admitting: Cardiovascular Disease

## 2015-12-19 ENCOUNTER — Other Ambulatory Visit: Payer: Self-pay | Admitting: Cardiovascular Disease

## 2016-04-14 ENCOUNTER — Emergency Department (HOSPITAL_COMMUNITY): Payer: No Typology Code available for payment source

## 2016-04-14 ENCOUNTER — Emergency Department (HOSPITAL_COMMUNITY)
Admission: EM | Admit: 2016-04-14 | Discharge: 2016-04-15 | Disposition: A | Discharge status: Home / Self Care | Payer: No Typology Code available for payment source | Attending: Emergency Medicine | Admitting: Emergency Medicine

## 2016-04-14 ENCOUNTER — Encounter (HOSPITAL_COMMUNITY): Payer: Self-pay

## 2016-04-14 DIAGNOSIS — Z951 Presence of aortocoronary bypass graft: Secondary | ICD-10-CM | POA: Insufficient documentation

## 2016-04-14 DIAGNOSIS — Y999 Unspecified external cause status: Secondary | ICD-10-CM | POA: Diagnosis not present

## 2016-04-14 DIAGNOSIS — R0789 Other chest pain: Secondary | ICD-10-CM | POA: Insufficient documentation

## 2016-04-14 DIAGNOSIS — I2510 Atherosclerotic heart disease of native coronary artery without angina pectoris: Secondary | ICD-10-CM | POA: Diagnosis not present

## 2016-04-14 DIAGNOSIS — Y939 Activity, unspecified: Secondary | ICD-10-CM | POA: Insufficient documentation

## 2016-04-14 DIAGNOSIS — I10 Essential (primary) hypertension: Secondary | ICD-10-CM | POA: Diagnosis not present

## 2016-04-14 DIAGNOSIS — Z7982 Long term (current) use of aspirin: Secondary | ICD-10-CM | POA: Insufficient documentation

## 2016-04-14 DIAGNOSIS — R1011 Right upper quadrant pain: Secondary | ICD-10-CM | POA: Diagnosis not present

## 2016-04-14 DIAGNOSIS — Z955 Presence of coronary angioplasty implant and graft: Secondary | ICD-10-CM | POA: Insufficient documentation

## 2016-04-14 DIAGNOSIS — Y92410 Unspecified street and highway as the place of occurrence of the external cause: Secondary | ICD-10-CM | POA: Insufficient documentation

## 2016-04-14 DIAGNOSIS — Z87891 Personal history of nicotine dependence: Secondary | ICD-10-CM | POA: Insufficient documentation

## 2016-04-14 DIAGNOSIS — Z79899 Other long term (current) drug therapy: Secondary | ICD-10-CM | POA: Diagnosis not present

## 2016-04-14 LAB — BASIC METABOLIC PANEL
Anion gap: 5 (ref 5–15)
BUN: 19 mg/dL (ref 6–20)
CALCIUM: 9 mg/dL (ref 8.9–10.3)
CO2: 24 mmol/L (ref 22–32)
CREATININE: 1.04 mg/dL (ref 0.61–1.24)
Chloride: 111 mmol/L (ref 101–111)
GFR calc Af Amer: >60 mL/min (ref >60)
GLUCOSE: 101 mg/dL — AB (ref 65–99)
POTASSIUM: 4.3 mmol/L (ref 3.5–5.1)
SODIUM: 140 mmol/L (ref 135–145)

## 2016-04-14 LAB — CBC
HEMATOCRIT: 44.2 % (ref 39.0–52.0)
Hemoglobin: 14.1 g/dL (ref 13.0–17.0)
MCH: 30.3 pg (ref 26.0–34.0)
MCHC: 31.9 g/dL (ref 30.0–36.0)
MCV: 94.8 fL (ref 78.0–100.0)
PLATELETS: 211 10*3/uL (ref 150–400)
RBC: 4.66 MIL/uL (ref 4.22–5.81)
RDW: 13.8 % (ref 11.5–15.5)
WBC: 4.8 10*3/uL (ref 4.0–10.5)

## 2016-04-14 LAB — I-STAT TROPONIN, ED: Troponin i, poc: 0 ng/mL (ref 0.00–0.08)

## 2016-04-14 MED ORDER — IOPAMIDOL (ISOVUE-300) INJECTION 61%
INTRAVENOUS | Status: AC
  Administered 2016-04-14: 100 mL
  Filled 2016-04-14: qty 100

## 2016-04-14 MED ORDER — MORPHINE SULFATE (PF) 2 MG/ML IV SOLN
2.0000 mg | Freq: Once | INTRAVENOUS | Status: AC
  Administered 2016-04-14: 2 mg via INTRAVENOUS
  Filled 2016-04-14: qty 1

## 2016-04-14 MED ORDER — ACETAMINOPHEN 500 MG PO TABS
1000.0000 mg | ORAL_TABLET | Freq: Once | ORAL | Status: AC
  Administered 2016-04-14: 1000 mg via ORAL
  Filled 2016-04-14: qty 2
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
## 2016-04-16 ENCOUNTER — Ambulatory Visit (HOSPITAL_COMMUNITY)
Admission: EM | Admit: 2016-04-16 | Discharge: 2016-04-16 | Disposition: A | Discharge status: Home / Self Care | Payer: Medicare Other | Attending: Physician Assistant | Admitting: Physician Assistant

## 2016-04-16 ENCOUNTER — Ambulatory Visit (INDEPENDENT_AMBULATORY_CARE_PROVIDER_SITE_OTHER): Payer: Medicare Other

## 2016-04-16 ENCOUNTER — Encounter (HOSPITAL_COMMUNITY): Payer: Self-pay | Admitting: *Deleted

## 2016-04-16 DIAGNOSIS — S20211S Contusion of right front wall of thorax, sequela: Secondary | ICD-10-CM

## 2016-04-16 DIAGNOSIS — S20211A Contusion of right front wall of thorax, initial encounter: Secondary | ICD-10-CM | POA: Diagnosis not present

## 2016-04-16 DIAGNOSIS — R0781 Pleurodynia: Secondary | ICD-10-CM

## 2016-04-16 MED ORDER — TRAMADOL HCL 50 MG PO TABS: 50.0000 mg | ORAL_TABLET | Freq: Four times a day (QID) | ORAL | 0 refills | Status: DC | PRN
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
## 2016-04-16 NOTE — Discharge Instructions (Signed)
You most likely have a rib contusion which can be very painful. We will start Tramadol 50mg  for moderate pain. You can take every 6 hours for pain. Use heating pad as needed but don't fall asleep with it. Do not lift heavy objects. If you have any worsening symptoms or pulmonary symptoms then please go to the ED.

## 2016-05-03 ENCOUNTER — Encounter: Payer: Self-pay | Admitting: Cardiovascular Disease

## 2016-05-15 ENCOUNTER — Emergency Department (HOSPITAL_COMMUNITY): Payer: Medicare Other

## 2016-05-15 ENCOUNTER — Inpatient Hospital Stay (HOSPITAL_COMMUNITY)
Admission: EM | Admit: 2016-05-15 | Discharge: 2016-05-25 | DRG: 027 | Disposition: A | Discharge status: Home Health | Payer: Medicare Other | Attending: Neurological Surgery | Admitting: Neurological Surgery

## 2016-05-15 ENCOUNTER — Encounter (HOSPITAL_COMMUNITY): Payer: Self-pay

## 2016-05-15 DIAGNOSIS — Z955 Presence of coronary angioplasty implant and graft: Secondary | ICD-10-CM | POA: Diagnosis not present

## 2016-05-15 DIAGNOSIS — Z9889 Other specified postprocedural states: Secondary | ICD-10-CM

## 2016-05-15 DIAGNOSIS — I6203 Nontraumatic chronic subdural hemorrhage: Secondary | ICD-10-CM | POA: Diagnosis present

## 2016-05-15 DIAGNOSIS — Z87891 Personal history of nicotine dependence: Secondary | ICD-10-CM

## 2016-05-15 DIAGNOSIS — I2510 Atherosclerotic heart disease of native coronary artery without angina pectoris: Secondary | ICD-10-CM | POA: Diagnosis present

## 2016-05-15 DIAGNOSIS — S065X9A Traumatic subdural hemorrhage with loss of consciousness of unspecified duration, initial encounter: Secondary | ICD-10-CM | POA: Diagnosis present

## 2016-05-15 DIAGNOSIS — Z888 Allergy status to other drugs, medicaments and biological substances status: Secondary | ICD-10-CM | POA: Diagnosis not present

## 2016-05-15 DIAGNOSIS — I480 Paroxysmal atrial fibrillation: Secondary | ICD-10-CM | POA: Diagnosis present

## 2016-05-15 DIAGNOSIS — Z7982 Long term (current) use of aspirin: Secondary | ICD-10-CM

## 2016-05-15 DIAGNOSIS — I10 Essential (primary) hypertension: Secondary | ICD-10-CM | POA: Diagnosis present

## 2016-05-15 DIAGNOSIS — E785 Hyperlipidemia, unspecified: Secondary | ICD-10-CM | POA: Diagnosis present

## 2016-05-15 DIAGNOSIS — Z951 Presence of aortocoronary bypass graft: Secondary | ICD-10-CM

## 2016-05-15 DIAGNOSIS — Z79899 Other long term (current) drug therapy: Secondary | ICD-10-CM | POA: Diagnosis not present

## 2016-05-15 DIAGNOSIS — R262 Difficulty in walking, not elsewhere classified: Secondary | ICD-10-CM | POA: Diagnosis present

## 2016-05-15 DIAGNOSIS — Z823 Family history of stroke: Secondary | ICD-10-CM

## 2016-05-15 DIAGNOSIS — S065XAA Traumatic subdural hemorrhage with loss of consciousness status unknown, initial encounter: Secondary | ICD-10-CM | POA: Diagnosis present

## 2016-05-15 LAB — CBC
HCT: 42 % (ref 39.0–52.0)
HEMATOCRIT: 41.9 % (ref 39.0–52.0)
HEMOGLOBIN: 13.4 g/dL (ref 13.0–17.0)
Hemoglobin: 13.5 g/dL (ref 13.0–17.0)
MCH: 30.3 pg (ref 26.0–34.0)
MCH: 30.4 pg (ref 26.0–34.0)
MCHC: 32 g/dL (ref 30.0–36.0)
MCHC: 32.1 g/dL (ref 30.0–36.0)
MCV: 94.8 fL (ref 78.0–100.0)
MCV: 94.6 fL (ref 78.0–100.0)
PLATELETS: 189 10*3/uL (ref 150–400)
Platelets: 193 10*3/uL (ref 150–400)
RBC: 4.42 MIL/uL (ref 4.22–5.81)
RBC: 4.44 MIL/uL (ref 4.22–5.81)
RDW: 14 % (ref 11.5–15.5)
RDW: 13.9 % (ref 11.5–15.5)
WBC: 5.9 10*3/uL (ref 4.0–10.5)
WBC: 5.4 10*3/uL (ref 4.0–10.5)

## 2016-05-15 LAB — COMPREHENSIVE METABOLIC PANEL
ALK PHOS: 70 U/L (ref 38–126)
ALK PHOS: 72 U/L (ref 38–126)
ALT: 32 U/L (ref 17–63)
ALT: 32 U/L (ref 17–63)
ANION GAP: 7 (ref 5–15)
AST: 32 U/L (ref 15–41)
AST: 31 U/L (ref 15–41)
Albumin: 3.7 g/dL (ref 3.5–5.0)
Albumin: 3.7 g/dL (ref 3.5–5.0)
Anion gap: 8 (ref 5–15)
BILIRUBIN TOTAL: 0.8 mg/dL (ref 0.3–1.2)
BILIRUBIN TOTAL: 0.8 mg/dL (ref 0.3–1.2)
BUN: 12 mg/dL (ref 6–20)
BUN: 10 mg/dL (ref 6–20)
CALCIUM: 9.3 mg/dL (ref 8.9–10.3)
CALCIUM: 9.4 mg/dL (ref 8.9–10.3)
CO2: 25 mmol/L (ref 22–32)
CO2: 26 mmol/L (ref 22–32)
CREATININE: 0.97 mg/dL (ref 0.61–1.24)
Chloride: 105 mmol/L (ref 101–111)
Chloride: 106 mmol/L (ref 101–111)
Creatinine, Ser: 0.92 mg/dL (ref 0.61–1.24)
Glucose, Bld: 151 mg/dL — ABNORMAL HIGH (ref 65–99)
Glucose, Bld: 94 mg/dL (ref 65–99)
Potassium: 4 mmol/L (ref 3.5–5.1)
Potassium: 4 mmol/L (ref 3.5–5.1)
Sodium: 138 mmol/L (ref 135–145)
Sodium: 139 mmol/L (ref 135–145)
TOTAL PROTEIN: 6.7 g/dL (ref 6.5–8.1)
TOTAL PROTEIN: 6.8 g/dL (ref 6.5–8.1)

## 2016-05-15 LAB — DIFFERENTIAL
Basophils Absolute: 0 10*3/uL (ref 0.0–0.1)
Basophils Relative: 0 %
Eosinophils Absolute: 0.1 10*3/uL (ref 0.0–0.7)
Eosinophils Relative: 2 %
LYMPHS ABS: 2.4 10*3/uL (ref 0.7–4.0)
LYMPHS PCT: 40 %
MONO ABS: 0.3 10*3/uL (ref 0.1–1.0)
MONOS PCT: 4 %
NEUTROS ABS: 3.1 10*3/uL (ref 1.7–7.7)
Neutrophils Relative %: 54 %

## 2016-05-15 LAB — I-STAT CHEM 8, ED
BUN: 13 mg/dL (ref 6–20)
CALCIUM ION: 1.15 mmol/L (ref 1.15–1.40)
CHLORIDE: 103 mmol/L (ref 101–111)
Creatinine, Ser: 1 mg/dL (ref 0.61–1.24)
GLUCOSE: 144 mg/dL — AB (ref 65–99)
HCT: 42 % (ref 39.0–52.0)
Hemoglobin: 14.3 g/dL (ref 13.0–17.0)
Potassium: 4 mmol/L (ref 3.5–5.1)
Sodium: 140 mmol/L (ref 135–145)
TCO2: 25 mmol/L (ref 0–100)

## 2016-05-15 LAB — I-STAT TROPONIN, ED: Troponin i, poc: 0 ng/mL (ref 0.00–0.08)

## 2016-05-15 LAB — TYPE AND SCREEN
ABO/RH(D): O POS
ANTIBODY SCREEN: NEGATIVE

## 2016-05-15 LAB — APTT: APTT: 26 s (ref 24–36)

## 2016-05-15 LAB — PROTIME-INR
INR: 1.05
INR: 1
PROTHROMBIN TIME: 13.2 s (ref 11.4–15.2)
Prothrombin Time: 13.7 seconds (ref 11.4–15.2)

## 2016-05-15 MED ORDER — ACETAMINOPHEN 650 MG RE SUPP: 650.0000 mg | RECTAL | Status: DC | PRN

## 2016-05-15 MED ORDER — HYDROCHLOROTHIAZIDE 25 MG PO TABS
25.0000 mg | ORAL_TABLET | Freq: Every day | ORAL | Status: DC
  Administered 2016-05-16 – 2016-05-24 (×8): 25 mg via ORAL
  Filled 2016-05-15 (×9): qty 1

## 2016-05-15 MED ORDER — TAMSULOSIN HCL 0.4 MG PO CAPS
0.4000 mg | ORAL_CAPSULE | Freq: Every day | ORAL | Status: DC
  Administered 2016-05-16 – 2016-05-25 (×9): 0.4 mg via ORAL
  Filled 2016-05-15 (×9): qty 1

## 2016-05-15 MED ORDER — MULTIVITAMINS PO CAPS: 1.0000 | ORAL_CAPSULE | Freq: Every day | ORAL | Status: DC

## 2016-05-15 MED ORDER — CARVEDILOL 3.125 MG PO TABS
3.1250 mg | ORAL_TABLET | Freq: Two times a day (BID) | ORAL | Status: DC
  Administered 2016-05-16 – 2016-05-25 (×18): 3.125 mg via ORAL
  Filled 2016-05-15 (×19): qty 1

## 2016-05-15 MED ORDER — ADULT MULTIVITAMIN W/MINERALS CH
1.0000 | ORAL_TABLET | Freq: Every day | ORAL | Status: DC
  Administered 2016-05-16 – 2016-05-25 (×9): 1 via ORAL
  Filled 2016-05-15 (×10): qty 1

## 2016-05-15 MED ORDER — SODIUM CHLORIDE 0.9 % IV SOLN
INTRAVENOUS | Status: DC
  Administered 2016-05-15 – 2016-05-19 (×3): via INTRAVENOUS

## 2016-05-15 MED ORDER — LISINOPRIL 10 MG PO TABS
10.0000 mg | ORAL_TABLET | Freq: Two times a day (BID) | ORAL | Status: DC
  Administered 2016-05-15 – 2016-05-24 (×17): 10 mg via ORAL
  Filled 2016-05-15 (×17): qty 1

## 2016-05-15 MED ORDER — ACETAMINOPHEN 325 MG PO TABS
650.0000 mg | ORAL_TABLET | ORAL | Status: DC | PRN
  Administered 2016-05-15 – 2016-05-18 (×2): 650 mg via ORAL
  Filled 2016-05-15 (×2): qty 2

## 2016-05-15 MED ORDER — FOLIC ACID 1 MG PO TABS
1.0000 mg | ORAL_TABLET | Freq: Every day | ORAL | Status: DC
  Administered 2016-05-16 – 2016-05-25 (×9): 1 mg via ORAL
  Filled 2016-05-15 (×9): qty 1

## 2016-05-15 MED ORDER — TRAMADOL HCL 50 MG PO TABS
50.0000 mg | ORAL_TABLET | Freq: Four times a day (QID) | ORAL | Status: DC | PRN
  Administered 2016-05-16 – 2016-05-25 (×16): 50 mg via ORAL
  Filled 2016-05-15 (×16): qty 1

## 2016-05-15 MED ORDER — SENNOSIDES-DOCUSATE SODIUM 8.6-50 MG PO TABS
1.0000 | ORAL_TABLET | Freq: Two times a day (BID) | ORAL | Status: DC
  Administered 2016-05-15 – 2016-05-18 (×7): 1 via ORAL
  Filled 2016-05-15 (×7): qty 1

## 2016-05-15 NOTE — ED Notes (Signed)
Neurosurgery Dr. Ronnald Ramp at bedside.
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
## 2016-05-15 NOTE — ED Notes (Signed)
After talking with patient, this RN spoke with MD Ellender Hose about patient's case and whether to call Code Stroke due to gait change. MD stated to walk patient and assess change in gait.
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
## 2016-05-15 NOTE — ED Notes (Signed)
When patient was brought into CT, Pt was scanned by MD at this time. Pt stated to MD that he woke up with the difficulty walking. MD cancelled Code Stroke.

## 2016-05-15 NOTE — ED Notes (Signed)
Patient comes in today with c/o of gait change, stumbling, and nausea. He denies h/a, vision changes, dizziness, speech problems, SOB. A/ox3. Disoriented to time. LSW time unclear but appears these symptoms have persisted for over a month. Hx of cardiac bypass. Not on blood thinners.

## 2016-05-15 NOTE — Progress Notes (Signed)
Patient arrived to 5C09 AAOx4 and pleasant. Family at bedside. Vitals taken, scds placed. Bed alarm activated. Pt with 5/10 pain for headache. PRN's given. Will continue to monitor. Helio Lack, Rande Brunt, RN
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
## 2016-05-15 NOTE — ED Notes (Signed)
Pt in [place]
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
## 2016-05-16 MED ORDER — LORAZEPAM 1 MG PO TABS
1.0000 mg | ORAL_TABLET | Freq: Two times a day (BID) | ORAL | Status: DC
  Administered 2016-05-16 – 2016-05-18 (×6): 1 mg via ORAL
  Filled 2016-05-16 (×6): qty 1
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
## 2016-05-17 ENCOUNTER — Inpatient Hospital Stay (HOSPITAL_COMMUNITY): Payer: Medicare Other

## 2016-05-17 NOTE — Progress Notes (Signed)
Patient ID: Alexander Farley, male   DOB: 19-Oct-1932, 82 y.o.   MRN: [id number] No headache, still some instability of gait, no drift, awake alert,CT stable but given gait issues I will tentatively plan crani for L SDH on Thursday.
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
## 2016-05-18 ENCOUNTER — Encounter (HOSPITAL_COMMUNITY): Payer: Self-pay | Admitting: General Practice

## 2016-05-18 ENCOUNTER — Inpatient Hospital Stay (HOSPITAL_COMMUNITY): Payer: Medicare Other

## 2016-05-18 ENCOUNTER — Ambulatory Visit: Payer: Medicare Other | Admitting: Cardiovascular Disease

## 2016-05-18 ENCOUNTER — Other Ambulatory Visit: Payer: Medicare Other

## 2016-05-18 NOTE — Progress Notes (Signed)
Patient ID: Alexander Farley, male   DOB: Apr 09, 1933, 82 y.o.   MRN: [id number] Patient doing the same. Still some trouble with gait. It's mostly his right leg which is somewhat uncoordinated. He is awake and alert and pleasant and conversant. CT scan today for surgical planning was stable.  Plan on left parietal craniotomy for evacuation of subdural hematoma tomorrow. He understands the risk of the surgery include but aren't limited to bleeding, infection, stroke, loss of vision or speech or motor function, need for further surgery, lack of relief of symptoms, worsening symptoms, and anesthesia risk. He agrees to proceed.
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
## 2016-05-18 NOTE — Care Management Important Message (Signed)
Important Message  Patient Details  Name: Alexander Farley MRN: [id number] Date of Birth: [birth year]   Medicare Important Message Given:  Yes    Shaleka Brines Montine Circle 05/18/2016, 9:55 AM
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
## 2016-05-19 ENCOUNTER — Inpatient Hospital Stay (HOSPITAL_COMMUNITY): Payer: Medicare Other | Admitting: Anesthesiology

## 2016-05-19 ENCOUNTER — Encounter (HOSPITAL_COMMUNITY)
Admission: EM | Disposition: A | Discharge status: Home Health | Payer: Self-pay | Source: Home / Self Care | Attending: Neurological Surgery

## 2016-05-19 DIAGNOSIS — Z9889 Other specified postprocedural states: Secondary | ICD-10-CM

## 2016-05-19 HISTORY — PX: APPLICATION OF CRANIAL NAVIGATION: SHX6578

## 2016-05-19 HISTORY — PX: CRANIOTOMY: SHX93

## 2016-05-19 LAB — SURGICAL PCR SCREEN
MRSA, PCR: NEGATIVE
Staphylococcus aureus: NEGATIVE

## 2016-05-19 SURGERY — CRANIOTOMY HEMATOMA EVACUATION SUBDURAL
Anesthesia: General

## 2016-05-19 MED ORDER — BACITRACIN ZINC 500 UNIT/GM EX OINT
TOPICAL_OINTMENT | CUTANEOUS | Status: DC | PRN
  Administered 2016-05-19: 1 via TOPICAL

## 2016-05-19 MED ORDER — CEFAZOLIN SODIUM-DEXTROSE 2-4 GM/100ML-% IV SOLN
2.0000 g | Freq: Three times a day (TID) | INTRAVENOUS | Status: AC
  Administered 2016-05-19 (×2): 2 g via INTRAVENOUS
  Filled 2016-05-19 (×2): qty 100

## 2016-05-19 MED ORDER — THROMBIN 5000 UNITS EX SOLR
OROMUCOSAL | Status: DC | PRN
  Administered 2016-05-19: 12:00:00 via TOPICAL

## 2016-05-19 MED ORDER — LABETALOL HCL 5 MG/ML IV SOLN
INTRAVENOUS | Status: AC
  Filled 2016-05-19: qty 4

## 2016-05-19 MED ORDER — ONDANSETRON HCL 4 MG PO TABS: 4.0000 mg | ORAL_TABLET | ORAL | Status: DC | PRN

## 2016-05-19 MED ORDER — THROMBIN 20000 UNITS EX SOLR
CUTANEOUS | Status: DC | PRN
  Administered 2016-05-19: 12:00:00 via TOPICAL

## 2016-05-19 MED ORDER — FENTANYL CITRATE (PF) 100 MCG/2ML IJ SOLN
INTRAMUSCULAR | Status: AC
  Filled 2016-05-19: qty 4

## 2016-05-19 MED ORDER — LABETALOL HCL 5 MG/ML IV SOLN
INTRAVENOUS | Status: DC | PRN
  Administered 2016-05-19 (×4): 5 mg via INTRAVENOUS

## 2016-05-19 MED ORDER — ACETAMINOPHEN 325 MG PO TABS: 650.0000 mg | ORAL_TABLET | ORAL | Status: DC | PRN

## 2016-05-19 MED ORDER — ACETAMINOPHEN 650 MG RE SUPP: 650.0000 mg | RECTAL | Status: DC | PRN

## 2016-05-19 MED ORDER — MORPHINE SULFATE (PF) 2 MG/ML IV SOLN: 1.0000 mg | INTRAVENOUS | Status: DC | PRN

## 2016-05-19 MED ORDER — LIDOCAINE HCL (CARDIAC) 20 MG/ML IV SOLN
INTRAVENOUS | Status: DC | PRN
  Administered 2016-05-19: 50 mg via INTRAVENOUS

## 2016-05-19 MED ORDER — ACETAMINOPHEN 10 MG/ML IV SOLN
INTRAVENOUS | Status: DC | PRN
  Administered 2016-05-19: 1000 mg via INTRAVENOUS

## 2016-05-19 MED ORDER — ONDANSETRON HCL 4 MG/2ML IJ SOLN: 4.0000 mg | INTRAMUSCULAR | Status: DC | PRN

## 2016-05-19 MED ORDER — PROPOFOL 10 MG/ML IV BOLUS
INTRAVENOUS | Status: DC | PRN
  Administered 2016-05-19: 150 mg via INTRAVENOUS

## 2016-05-19 MED ORDER — SUGAMMADEX SODIUM 200 MG/2ML IV SOLN
INTRAVENOUS | Status: DC | PRN
  Administered 2016-05-19: 147 mg via INTRAVENOUS

## 2016-05-19 MED ORDER — DEXAMETHASONE SODIUM PHOSPHATE 4 MG/ML IJ SOLN: 4.0000 mg | Freq: Three times a day (TID) | INTRAMUSCULAR | Status: DC

## 2016-05-19 MED ORDER — LIDOCAINE 2% (20 MG/ML) 5 ML SYRINGE
INTRAMUSCULAR | Status: AC
  Filled 2016-05-19: qty 10

## 2016-05-19 MED ORDER — FENTANYL CITRATE (PF) 100 MCG/2ML IJ SOLN
INTRAMUSCULAR | Status: DC | PRN
  Administered 2016-05-19: 100 ug via INTRAVENOUS

## 2016-05-19 MED ORDER — WHITE PETROLATUM GEL
Status: AC
  Administered 2016-05-19: 16:00:00
  Filled 2016-05-19: qty 1

## 2016-05-19 MED ORDER — DEXTROSE 5 % IV SOLN
INTRAVENOUS | Status: DC | PRN
  Administered 2016-05-19: 25 ug/min via INTRAVENOUS

## 2016-05-19 MED ORDER — POTASSIUM CHLORIDE IN NACL 20-0.9 MEQ/L-% IV SOLN
INTRAVENOUS | Status: DC
  Administered 2016-05-19 – 2016-05-25 (×7): via INTRAVENOUS
  Filled 2016-05-19 (×13): qty 1000

## 2016-05-19 MED ORDER — ONDANSETRON HCL 4 MG/2ML IJ SOLN
INTRAMUSCULAR | Status: DC | PRN
  Administered 2016-05-19: 4 mg via INTRAVENOUS

## 2016-05-19 MED ORDER — CEFAZOLIN SODIUM-DEXTROSE 2-3 GM-% IV SOLR
INTRAVENOUS | Status: DC | PRN
  Administered 2016-05-19: 2 g via INTRAVENOUS

## 2016-05-19 MED ORDER — LIDOCAINE-EPINEPHRINE 1 %-1:100000 IJ SOLN
INTRAMUSCULAR | Status: DC | PRN
  Administered 2016-05-19: 6 mL

## 2016-05-19 MED ORDER — SODIUM CHLORIDE 0.9 % IV SOLN
500.0000 mg | Freq: Two times a day (BID) | INTRAVENOUS | Status: DC
  Administered 2016-05-19 – 2016-05-22 (×6): 500 mg via INTRAVENOUS
  Filled 2016-05-19 (×8): qty 5

## 2016-05-19 MED ORDER — DEXAMETHASONE SODIUM PHOSPHATE 10 MG/ML IJ SOLN
6.0000 mg | Freq: Four times a day (QID) | INTRAMUSCULAR | Status: AC
  Administered 2016-05-19 – 2016-05-20 (×4): 6 mg via INTRAVENOUS
  Filled 2016-05-19 (×4): qty 1

## 2016-05-19 MED ORDER — PHENYLEPHRINE HCL 10 MG/ML IJ SOLN
INTRAMUSCULAR | Status: DC | PRN
Start: 2016-05-19 — End: 2016-05-19
  Administered 2016-05-19: 80 ug via INTRAVENOUS

## 2016-05-19 MED ORDER — PROPOFOL 10 MG/ML IV BOLUS
INTRAVENOUS | Status: AC
  Filled 2016-05-19: qty 40

## 2016-05-19 MED ORDER — ACETAMINOPHEN 10 MG/ML IV SOLN
INTRAVENOUS | Status: AC
  Filled 2016-05-19: qty 100

## 2016-05-19 MED ORDER — SODIUM CHLORIDE 0.9 % IR SOLN
Status: DC | PRN
  Administered 2016-05-19: 12:00:00

## 2016-05-19 MED ORDER — CEFAZOLIN SODIUM-DEXTROSE 2-4 GM/100ML-% IV SOLN
INTRAVENOUS | Status: AC
  Filled 2016-05-19: qty 100

## 2016-05-19 MED ORDER — DEXAMETHASONE SODIUM PHOSPHATE 4 MG/ML IJ SOLN: 4.0000 mg | Freq: Four times a day (QID) | INTRAMUSCULAR | Status: DC

## 2016-05-19 MED ORDER — ROCURONIUM BROMIDE 10 MG/ML (PF) SYRINGE
PREFILLED_SYRINGE | INTRAVENOUS | Status: AC
  Filled 2016-05-19: qty 10

## 2016-05-19 MED ORDER — ARTIFICIAL TEARS OP OINT
TOPICAL_OINTMENT | OPHTHALMIC | Status: AC
  Filled 2016-05-19: qty 3.5

## 2016-05-19 MED ORDER — PHENYLEPHRINE 40 MCG/ML (10ML) SYRINGE FOR IV PUSH (FOR BLOOD PRESSURE SUPPORT)
PREFILLED_SYRINGE | INTRAVENOUS | Status: AC
  Filled 2016-05-19: qty 10

## 2016-05-19 MED ORDER — ROCURONIUM BROMIDE 100 MG/10ML IV SOLN
INTRAVENOUS | Status: DC | PRN
  Administered 2016-05-19: 50 mg via INTRAVENOUS

## 2016-05-19 MED ORDER — FENTANYL CITRATE (PF) 100 MCG/2ML IJ SOLN: 25.0000 ug | INTRAMUSCULAR | Status: DC | PRN

## 2016-05-19 MED ORDER — LABETALOL HCL 5 MG/ML IV SOLN
10.0000 mg | INTRAVENOUS | Status: DC | PRN
  Administered 2016-05-19 (×2): 10 mg via INTRAVENOUS

## 2016-05-19 MED ORDER — SUGAMMADEX SODIUM 200 MG/2ML IV SOLN
INTRAVENOUS | Status: AC
  Filled 2016-05-19: qty 2

## 2016-05-19 SURGICAL SUPPLY — 58 items
BAG DECANTER FOR FLEXI CONT (MISCELLANEOUS) ×3 IMPLANT
BUR SPIRAL ROUTER 2.3 (BUR) ×2 IMPLANT
BUR SPIRAL ROUTER 2.3MM (BUR) ×1
CANISTER SUCT 3000ML PPV (MISCELLANEOUS) ×3 IMPLANT
CLIP TI MEDIUM 6 (CLIP) IMPLANT
DRAIN JACKSON PRATT 10MM FLAT (MISCELLANEOUS) ×3 IMPLANT
DRAPE MICROSCOPE LEICA (MISCELLANEOUS) IMPLANT
DRAPE NEUROLOGICAL W/INCISE (DRAPES) ×3 IMPLANT
DRAPE SURG 17X23 STRL (DRAPES) IMPLANT
DRAPE WARM FLUID 44X44 (DRAPE) ×3 IMPLANT
DURAPREP 6ML APPLICATOR 50/CS (WOUND CARE) ×3 IMPLANT
ELECT CAUTERY BLADE 6.4 (BLADE) ×3 IMPLANT
ELECT REM PT RETURN 9FT ADLT (ELECTROSURGICAL) ×3
ELECTRODE REM PT RTRN 9FT ADLT (ELECTROSURGICAL) ×1 IMPLANT
EVACUATOR 1/8 PVC DRAIN (DRAIN) IMPLANT
EVACUATOR SILICONE 100CC (DRAIN) ×3 IMPLANT
GAUZE SPONGE 4X4 12PLY STRL (GAUZE/BANDAGES/DRESSINGS) ×3 IMPLANT
GAUZE SPONGE 4X4 16PLY XRAY LF (GAUZE/BANDAGES/DRESSINGS) IMPLANT
GLOVE BIO SURGEON STRL SZ8 (GLOVE) ×3 IMPLANT
GLOVE ECLIPSE 7.5 STRL STRAW (GLOVE) ×9 IMPLANT
GLOVE INDICATOR 8.0 STRL GRN (GLOVE) ×3 IMPLANT
GOWN STRL REUS W/ TWL LRG LVL3 (GOWN DISPOSABLE) IMPLANT
GOWN STRL REUS W/ TWL XL LVL3 (GOWN DISPOSABLE) ×1 IMPLANT
GOWN STRL REUS W/TWL 2XL LVL3 (GOWN DISPOSABLE) ×3 IMPLANT
GOWN STRL REUS W/TWL LRG LVL3 (GOWN DISPOSABLE)
GOWN STRL REUS W/TWL XL LVL3 (GOWN DISPOSABLE) ×2
HEMOSTAT POWDER KIT SURGIFOAM (HEMOSTASIS) ×3 IMPLANT
KIT BASIN OR (CUSTOM PROCEDURE TRAY) ×3 IMPLANT
KIT ROOM TURNOVER OR (KITS) ×3 IMPLANT
MARKER SPHERE PSV REFLC 13MM (MARKER) ×12 IMPLANT
NEEDLE HYPO 22GX1.5 SAFETY (NEEDLE) ×3 IMPLANT
NS IRRIG 1000ML POUR BTL (IV SOLUTION) ×3 IMPLANT
PACK CRANIOTOMY (CUSTOM PROCEDURE TRAY) ×3 IMPLANT
PAD ARMBOARD 7.5X6 YLW CONV (MISCELLANEOUS) ×3 IMPLANT
PATTIES SURGICAL .25X.25 (GAUZE/BANDAGES/DRESSINGS) IMPLANT
PATTIES SURGICAL .5 X.5 (GAUZE/BANDAGES/DRESSINGS) IMPLANT
PATTIES SURGICAL .5 X3 (DISPOSABLE) IMPLANT
PATTIES SURGICAL 1X1 (DISPOSABLE) IMPLANT
PERFORATOR LRG  14-11MM (BIT) ×6
PERFORATOR LRG 14-11MM (BIT) ×3 IMPLANT
PIN MAYFIELD SKULL DISP (PIN) ×3 IMPLANT
PLATE 1.5  2HOLE LNG NEURO (Plate) ×6 IMPLANT
PLATE 1.5 2HOLE LNG NEURO (Plate) ×3 IMPLANT
RUBBERBAND STERILE (MISCELLANEOUS) IMPLANT
SCREW SELF DRILL HT 1.5/4MM (Screw) ×18 IMPLANT
SPONGE NEURO XRAY DETECT 1X3 (DISPOSABLE) IMPLANT
SPONGE SURGIFOAM ABS GEL 100 (HEMOSTASIS) ×3 IMPLANT
STAPLER VISISTAT 35W (STAPLE) ×3 IMPLANT
SUT ETHILON 3 0 FSL (SUTURE) IMPLANT
SUT NURALON 4 0 TR CR/8 (SUTURE) ×6 IMPLANT
SUT VIC AB 2-0 CP2 18 (SUTURE) ×3 IMPLANT
SYR CONTROL 10ML LL (SYRINGE) ×3 IMPLANT
TAPE CLOTH SURG 4X10 WHT LF (GAUZE/BANDAGES/DRESSINGS) ×3 IMPLANT
TOWEL OR 17X24 6PK STRL BLUE (TOWEL DISPOSABLE) ×3 IMPLANT
TOWEL OR 17X26 10 PK STRL BLUE (TOWEL DISPOSABLE) ×3 IMPLANT
TRAY FOLEY W/METER SILVER 16FR (SET/KITS/TRAYS/PACK) IMPLANT
UNDERPAD 30X30 (UNDERPADS AND DIAPERS) IMPLANT
WATER STERILE IRR 1000ML POUR (IV SOLUTION) ×3 IMPLANT

## 2016-05-19 NOTE — Op Note (Signed)
05/15/2016 - 05/19/2016  1:34 PM  PATIENT:  Alexander Farley  82 y.o. male  PRE-OPERATIVE DIAGNOSIS:  Left subdural hematoma  POST-OPERATIVE DIAGNOSIS:  Same  PROCEDURE:  Left parietal craniotomy for evacuation of subdural hematoma  SURGEON:  Sherley Bounds, MD  ASSISTANTS: None  ANESTHESIA:   General  EBL: 50 ml  No intake/output data recorded.  BLOOD ADMINISTERED:none  DRAINS: 10 flat fluted JP   SPECIMEN:  No Specimen  INDICATION FOR PROCEDURE: This patient presented to the ER with difficulty with gait. CT scan showed bilateral mixed density subdural hematomas larger on the left. It appeared his right side was given in trouble. I recommended a left craniotomy for evacuation. Patient understood the risks, benefits, and alternatives and potential outcomes and wished to proceed.  PROCEDURE DETAILS: The patient was taken to the operating room and after induction of adequate generalized endotracheal anesthesia, the head was affixed in a 3 point Mayfield head rest, and turned to the right to expose the left frontotemporal parietal region. The head was shaved and then cleaned and then prepped with DuraPrep and draped in the usual sterile fashion. 10 cc of local anesthetic was injected, and a linear incision was made on the left of the head. Raney clips were placed to establish hemostasis of the scalp, the muscle was reflected with the scalp flap, to expose the left parietal region. A burr hole was placed, and a craniotomy flap was turned utilizing the high-speed, air powered drill. The flap was then placed in bacitracin-containing saline solution, and the dura was opened to expose left parietal region. A dark hematoma was then removed with a combination of irrigation and suction. I continued to irrigate until the irrigant was clear to, and dried any bleeding with bipolar cautery. I then placed a subdural drain through separate stab incision and close the dura with a running 4-0 Nurolon suture. The  dura was lined with Gelfoam, and the craniotomy flap was replaced with doggie-bone plates. The wound was copiously irrigated. A subgaleal drain was placed, and the galea was then closed with interrupted 2-0 Vicryl suture. The skin was then closed with staples a sterile dressing was applied. The patient was then taken out of the 3-point Mayfield headrest and awakened from general anesthesia, and transported to the recovery room in stable condition. At the end of the procedure all sponge, needle, and instrument counts were correct.   PLAN OF CARE: Admit to inpatient   PATIENT DISPOSITION:  ICU - extubated and stable.   Delay start of Pharmacological VTE agent (>24hrs) due to surgical blood loss or risk of bleeding:  yes

## 2016-05-19 NOTE — Transfer of Care (Signed)
Immediate Anesthesia Transfer of Care Note  Patient: Alexander Farley  Procedure(s) Performed: Procedure(s): CRANIOTOMY HEMATOMA EVACUATION SUBDURAL with brain lab (N/A) APPLICATION OF CRANIAL NAVIGATION (N/A)  Patient Location: PACU  Anesthesia Type:General  Level of Consciousness: awake and alert   Airway & Oxygen Therapy: Patient Spontanous Breathing and Patient connected to nasal cannula oxygen  Post-op Assessment: Report given to RN and Post -op Vital signs reviewed and stable  Post vital signs: Reviewed and stable  Last Vitals:  Vitals:   05/19/16 1357 05/19/16 1400  BP:  (!) 162/105  Pulse: 70 73  Resp: 16 (!) 22  Temp: 36.6 C     Last Pain:  Vitals:   05/19/16 0800  TempSrc:   PainSc: 0-No pain      Patients Stated Pain Goal: 0 ([id number])  Complications: No apparent anesthesia complications

## 2016-05-19 NOTE — Anesthesia Postprocedure Evaluation (Signed)
Anesthesia Post Note  Patient: Alexander Farley  Procedure(s) Performed: Procedure(s) (LRB): CRANIOTOMY HEMATOMA EVACUATION SUBDURAL with brain lab (N/A) APPLICATION OF CRANIAL NAVIGATION (N/A)  Patient location during evaluation: PACU Anesthesia Type: General Level of consciousness: awake Pain management: pain level controlled Vital Signs Assessment: post-procedure vital signs reviewed and stable Cardiovascular status: stable Anesthetic complications: no    Last Vitals:  Vitals:   05/19/16 1453 05/19/16 1500  BP: (!) 160/91 (!) 143/91  Pulse: 67 63  Resp: 12 15  Temp: 36.8 C     Last Pain:  Vitals:   05/19/16 1453  TempSrc: Oral  PainSc:                  EDWARDS,Gerene Nedd
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
## 2016-05-19 NOTE — Anesthesia Preprocedure Evaluation (Signed)
Anesthesia Evaluation  Patient identified by MRN, date of birth, ID band Patient awake    Reviewed: Allergy & Precautions, NPO status , Patient's Chart, lab work & pertinent test results  Airway Mallampati: II  TM Distance: >3 FB     Dental   Pulmonary neg pulmonary ROS, former smoker,    breath sounds clear to auscultation       Cardiovascular hypertension, + angina + CAD  + dysrhythmias  Rhythm:Regular Rate:Normal     Neuro/Psych    GI/Hepatic negative GI ROS, Neg liver ROS,   Endo/Other  negative endocrine ROS  Renal/GU negative Renal ROS     Musculoskeletal   Abdominal   Peds  Hematology   Anesthesia Other Findings   Reproductive/Obstetrics                             Anesthesia Physical Anesthesia Plan  ASA: III  Anesthesia Plan: General   Post-op Pain Management:    Induction: Intravenous  Airway Management Planned: Oral ETT  Additional Equipment:   Intra-op Plan:   Post-operative Plan: Possible Post-op intubation/ventilation  Informed Consent: I have reviewed the patients History and Physical, chart, labs and discussed the procedure including the risks, benefits and alternatives for the proposed anesthesia with the patient or authorized representative who has indicated his/her understanding and acceptance.   Dental advisory given  Plan Discussed with: CRNA and Anesthesiologist  Anesthesia Plan Comments:         Anesthesia Quick Evaluation

## 2016-05-19 NOTE — Anesthesia Procedure Notes (Signed)
Procedure Name: Intubation Date/Time: 05/19/2016 12:54 PM Performed by: Eligha Bridegroom Pre-anesthesia Checklist: Patient identified, Emergency Drugs available, Suction available, Patient being monitored and Timeout performed Patient Re-evaluated:Patient Re-evaluated prior to inductionOxygen Delivery Method: Circle system utilized Preoxygenation: Pre-oxygenation with 100% oxygen Intubation Type: IV induction Ventilation: Mask ventilation without difficulty Laryngoscope Size: Mac and 4 Grade View: Grade I Tube type: Oral Tube size: 7.5 mm Number of attempts: 1 Airway Equipment and Method: Stylet Placement Confirmation: ETT inserted through vocal cords under direct vision,  positive ETCO2 and breath sounds checked- equal and bilateral Secured at: 22 cm Tube secured with: Tape Dental Injury: Teeth and Oropharynx as per pre-operative assessment

## 2016-05-19 NOTE — Care Management Note (Signed)
Case Management Note  Patient Details  Name: Alexander Farley MRN: [id number] Date of Birth: [birth year]  Subjective/Objective:                    Action/Plan: Plan is for craniotomy today. MD consulted CM for [facility] to be arranged for home when patient ready for discharge. CM met with the patient and his family and provided them a list of [facility] agencies in the [place] area. They selected [facility]. Butch Penny with [facility] notified and will follow the patient for discharge.   Expected Discharge Date:                  Expected Discharge Plan:  [place]  In-House Referral:     Discharge planning Services  CM Consult  Post Acute Care Choice:  Home Health Choice offered to:  Patient  DME Arranged:    DME Agency:     HH Arranged:    [place] Agency:  [place]  Status of Service:  In process, will continue to follow  If discussed at Long Length of Stay Meetings, dates discussed:    Additional Comments:  Pollie Friar, RN 05/19/2016, 10:49 AM

## 2016-05-20 ENCOUNTER — Encounter (HOSPITAL_COMMUNITY): Payer: Self-pay | Admitting: Neurological Surgery

## 2016-05-20 ENCOUNTER — Inpatient Hospital Stay (HOSPITAL_COMMUNITY): Payer: Medicare Other

## 2016-05-20 NOTE — Progress Notes (Signed)
Patient ID: Alexander Farley, male   DOB: [birth year], 82 y.o.   MRN: [id number] Subjective: Patient reports no headache, doing great  Objective: Vital signs in last 24 hours: Temp:  [97.2 F (36.2 C)-98.9 F (37.2 C)] 98.1 F (36.7 C) (09/22 1200) Pulse Rate:  [56-82] 61 (09/22 0700) Resp:  [12-22] 14 (09/22 0700) BP: (82-162)/(55-105) 120/71 (09/22 0700) SpO2:  [96 %-100 %] 98 % (09/22 0700) Arterial Line BP: (154-200)/(47-88) 154/47 (09/21 1500)  Intake/Output from previous day: 09/21 0701 - 09/22 0700 In: 2510 [I.V.:2300; IV Piggyback:210] Out: 1340 [Urine:1050; Drains:260; Blood:30] Intake/Output this shift: No intake/output data recorded.  Neurologic: Grossly normal  Lab Results: Lab Results  Component Value Date   WBC 5.4 05/15/2016   HGB 13.5 05/15/2016   HCT 42.0 05/15/2016   MCV 94.6 05/15/2016   PLT 189 05/15/2016   Lab Results  Component Value Date   INR 1.00 05/15/2016   BMET Lab Results  Component Value Date   NA 139 05/15/2016   K 4.0 05/15/2016   CL 106 05/15/2016   CO2 26 05/15/2016   GLUCOSE 94 05/15/2016   BUN 10 05/15/2016   CREATININE 0.92 05/15/2016   CALCIUM 9.4 05/15/2016    Studies/Results: Ct Head Wo Contrast  Result Date: 05/20/2016 CLINICAL DATA:  Subdural hematoma.  Follow-up. EXAM: CT HEAD WITHOUT CONTRAST TECHNIQUE: Contiguous axial images were obtained from the base of the skull through the vertex without intravenous contrast. COMPARISON:  05/18/2016 FINDINGS: Brain: Interval left craniotomy with placement of subdural drain. Subtotal evacuation of the subdural hematoma on the left, reduced in thickness from 2.3 cm to only about 1 cm. Small amount of subdural air as expected. Less mass effect upon the left hemisphere. Mixed age subdural hematoma along the convexity on the right shows a maximal thickness of 1.3 cm, very minimally larger than was seen 2 days ago. There has been some contraction of the more hyperdense components but I do not  think there is strong evidence of additional active bleeding. No midline shift. No ventricular trapping. No intraparenchymal bleeding. Chronic small-vessel ischemic change of the white matter again demonstrated. Vascular: There is atherosclerotic calcification of the major vessels at the base of the brain. Skull: Expected craniotomy changes. Sinuses/Orbits: Clear/normal Other: None IMPRESSION: Left parietal craniotomy for subdural evacuation and placement of subdural drain. Left convexity subdural hematoma is smaller, reduced in maximal thickness from 2.3 cm to 1 cm. Less mass effect. No midline shift. No significant change in the right convexity mixed density subdural hematoma. Maximal thickness is 1.3 cm compared 1.2 cm 2 days ago. There has been some contraction of the blood products in areas but I doubt there has been any new hyperdense bleeding. Electronically Signed   By: Nelson Chimes M.D.   On: 05/20/2016 07:09    Assessment/Plan: Doing great, to floor  LOS: 5 days    JONES,DAVID S 05/20/2016, 12:31 PM

## 2016-05-20 NOTE — Anesthesia Postprocedure Evaluation (Signed)
Anesthesia Post Note  Patient: Alexander Farley  Procedure(s) Performed: Procedure(s) (LRB): CRANIOTOMY HEMATOMA EVACUATION SUBDURAL with brain lab (N/A) APPLICATION OF CRANIAL NAVIGATION (N/A)  Patient location during evaluation: PACU Anesthesia Type: General Level of consciousness: awake Pain management: pain level controlled Vital Signs Assessment: post-procedure vital signs reviewed and stable Cardiovascular status: stable Anesthetic complications: no    Last Vitals:  Vitals:   05/20/16 0700 05/20/16 0800  BP: 120/71   Pulse: 61   Resp: 14   Temp:  37 C    Last Pain:  Vitals:   05/20/16 0800  TempSrc: Oral  PainSc:                  EDWARDS,Shailene Demonbreun
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
## 2016-05-21 NOTE — Progress Notes (Signed)
Patient ID: Alexander Farley, male   DOB: 1933-04-24, 82 y.o.   MRN: [id number] Patient doing well no significant headache  Awake alert neurologically nonfocal incision clean dry and intact  Physical occupational therapy eval today
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
## 2016-05-22 MED ORDER — LEVETIRACETAM 500 MG PO TABS
500.0000 mg | ORAL_TABLET | Freq: Two times a day (BID) | ORAL | Status: DC
  Administered 2016-05-22 – 2016-05-23 (×3): 500 mg via ORAL
  Filled 2016-05-22 (×3): qty 1

## 2016-05-22 MED ORDER — DOCUSATE SODIUM 100 MG PO CAPS
100.0000 mg | ORAL_CAPSULE | Freq: Every day | ORAL | Status: DC
  Administered 2016-05-22 – 2016-05-25 (×4): 100 mg via ORAL
  Filled 2016-05-22 (×4): qty 1

## 2016-05-22 MED ORDER — SENNA 8.6 MG PO TABS
1.0000 | ORAL_TABLET | Freq: Every day | ORAL | Status: DC
  Administered 2016-05-22 – 2016-05-25 (×4): 8.6 mg via ORAL
  Filled 2016-05-22 (×4): qty 1
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
## 2016-05-22 NOTE — Progress Notes (Signed)
Patient complained of not feeling well. Patient also noted to be sleeping more this afternoon and weaker on the right leg, MD called and notified. MD said to monitor the patient at this time.

## 2016-05-22 NOTE — Progress Notes (Signed)
Patient ID: Alexander Farley, male   DOB: [birth year], 82 y.o.   MRN: [id number] BP (!) 171/79 (BP Location: Right Arm)   Pulse 68   Temp 98.7 F (37.1 C) (Oral)   Resp 18   Ht 5\' 9"  (1.753 m)   Wt 73.5 kg (162 lb)   SpO2 99%   BMI 23.92 kg/m  Alert and oriented x 4, speech is clear and fluent Moving all extremities well Wound is clean, dry, and without signs of infection Will saline lock iv, keppra switched to po

## 2016-05-23 MED ORDER — LEVETIRACETAM 250 MG PO TABS
250.0000 mg | ORAL_TABLET | Freq: Two times a day (BID) | ORAL | Status: DC
  Administered 2016-05-23: 250 mg via ORAL
  Filled 2016-05-23: qty 1

## 2016-05-23 NOTE — Care Management Note (Signed)
Case Management Note  Patient Details  Name: Alexander Farley MRN: [id number] Date of Birth: October 14, 1932  Subjective/Objective:                    Action/Plan: Plan is for home with [facility] with DME when medically stable for discharge. CM following for [place] orders.   Expected Discharge Date:                  Expected Discharge Plan:  [place]  In-House Referral:     Discharge planning Services  CM Consult  Post Acute Care Choice:  Home Health Choice offered to:  Patient  DME Arranged:    DME Agency:     HH Arranged:    [place] Agency:  [place]  Status of Service:  In process, will continue to follow  If discussed at Long Length of Stay Meetings, dates discussed:    Additional Comments:  Pollie Friar, RN 05/23/2016, 10:49 AM
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
## 2016-05-23 NOTE — Progress Notes (Signed)
Patient ID: Alexander Farley, male   DOB: 1933/08/15, 82 y.o.   MRN: [id number] Doing well, awake alert, conversive, incision looks good, no drift  Hopefully home when stable enough in gait to reduce risk of fall and readmission

## 2016-05-23 NOTE — Care Management Important Message (Signed)
Important Message  Patient Details  Name: Alexander Farley MRN: [id number] Date of Birth: 10-May-1933   Medicare Important Message Given:  Yes    Erion Weightman Montine Circle 05/23/2016, 11:39 AM

## 2016-05-24 ENCOUNTER — Inpatient Hospital Stay (HOSPITAL_COMMUNITY): Payer: Medicare Other
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
## 2016-05-24 NOTE — Progress Notes (Signed)
Patient ID: Alexander Farley, male   DOB: 03/18/1933, 82 y.o.   MRN: [id number] Looks good, mild headache, no drift, incision good  CT head today for follow up, esp to eval the RIGHT SDH to make sure it is not getting bigger prior to d/c home  HOME safety eval? Will ask [facility] if this is possible prior to d/c to make sure the home is as safe as possible to prevent falls and re-admission  Maybe home tomorrow if he continues to progress with therapy as he has done slowly since surgery.
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
## 2016-05-25 NOTE — Care Management Note (Signed)
Case Management Note  Patient Details  Name: Rojelio E Thelander MRN: [id number] Date of Birth: 12/07/1932  Subjective/Objective:                    Action/Plan: Plan is for patient to discharge home today with HH services. CM had already met with family and arranged HH with [facility]. Karen with [facility] informed of discharge today. Dr Jones asked about a safety visit of the home prior to patients arrival. CM spoke to Karen at [facility] about a safety visit and they are not able to provide this services prior to patients arrival home.   Expected Discharge Date:                  Expected Discharge Plan:  Home w Home Health Services  In-House Referral:     Discharge planning Services  CM Consult  Post Acute Care Choice:  Home Health Choice offered to:  Patient  DME Arranged:    DME Agency:     HH Arranged:    HH Agency:  [facility]  Status of Service:  Completed, signed off  If discussed at Long Length of Stay Meetings, dates discussed:    Additional Comments:  Kelli F Willard, RN 05/25/2016, 10:41 AM  

## 2016-05-25 NOTE — Progress Notes (Signed)
Pt being discharged from hospital per orders from MD. Pt and family educated on discharge instructions. Pt and family verbalized understanding of instructions. All questions and concerns were addressed. Pt's IV was removed prior to discharge. Pt exited hospital via wheelchair. 
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
## 2016-05-29 ENCOUNTER — Observation Stay (HOSPITAL_COMMUNITY)
Admission: EM | Admit: 2016-05-29 | Discharge: 2016-06-01 | Disposition: A | Discharge status: Skilled Nursing Facility | Payer: Medicare Other | Attending: Internal Medicine | Admitting: Internal Medicine

## 2016-05-29 ENCOUNTER — Emergency Department (HOSPITAL_COMMUNITY): Payer: Medicare Other

## 2016-05-29 ENCOUNTER — Encounter (HOSPITAL_COMMUNITY): Payer: Self-pay | Admitting: Emergency Medicine

## 2016-05-29 DIAGNOSIS — Z9889 Other specified postprocedural states: Secondary | ICD-10-CM

## 2016-05-29 DIAGNOSIS — Z79899 Other long term (current) drug therapy: Secondary | ICD-10-CM | POA: Insufficient documentation

## 2016-05-29 DIAGNOSIS — Z6821 Body mass index (BMI) 21.0-21.9, adult: Secondary | ICD-10-CM | POA: Diagnosis not present

## 2016-05-29 DIAGNOSIS — I480 Paroxysmal atrial fibrillation: Secondary | ICD-10-CM | POA: Insufficient documentation

## 2016-05-29 DIAGNOSIS — Z951 Presence of aortocoronary bypass graft: Secondary | ICD-10-CM | POA: Diagnosis not present

## 2016-05-29 DIAGNOSIS — E785 Hyperlipidemia, unspecified: Secondary | ICD-10-CM | POA: Insufficient documentation

## 2016-05-29 DIAGNOSIS — R531 Weakness: Secondary | ICD-10-CM

## 2016-05-29 DIAGNOSIS — Z7982 Long term (current) use of aspirin: Secondary | ICD-10-CM | POA: Diagnosis not present

## 2016-05-29 DIAGNOSIS — Z955 Presence of coronary angioplasty implant and graft: Secondary | ICD-10-CM | POA: Diagnosis not present

## 2016-05-29 DIAGNOSIS — I10 Essential (primary) hypertension: Secondary | ICD-10-CM | POA: Diagnosis present

## 2016-05-29 DIAGNOSIS — Z23 Encounter for immunization: Secondary | ICD-10-CM | POA: Insufficient documentation

## 2016-05-29 DIAGNOSIS — F419 Anxiety disorder, unspecified: Secondary | ICD-10-CM | POA: Diagnosis not present

## 2016-05-29 DIAGNOSIS — I2510 Atherosclerotic heart disease of native coronary artery without angina pectoris: Secondary | ICD-10-CM | POA: Diagnosis not present

## 2016-05-29 DIAGNOSIS — Z87891 Personal history of nicotine dependence: Secondary | ICD-10-CM | POA: Diagnosis not present

## 2016-05-29 DIAGNOSIS — R55 Syncope and collapse: Secondary | ICD-10-CM | POA: Diagnosis present

## 2016-05-29 DIAGNOSIS — S065X9A Traumatic subdural hemorrhage with loss of consciousness of unspecified duration, initial encounter: Secondary | ICD-10-CM

## 2016-05-29 DIAGNOSIS — R079 Chest pain, unspecified: Secondary | ICD-10-CM | POA: Diagnosis not present

## 2016-05-29 DIAGNOSIS — E440 Moderate protein-calorie malnutrition: Secondary | ICD-10-CM | POA: Diagnosis not present

## 2016-05-29 DIAGNOSIS — S065XAA Traumatic subdural hemorrhage with loss of consciousness status unknown, initial encounter: Secondary | ICD-10-CM | POA: Diagnosis present

## 2016-05-29 LAB — CBC WITH DIFFERENTIAL/PLATELET
BASOS ABS: 0 10*3/uL (ref 0.0–0.1)
BASOS PCT: 0 %
EOS ABS: 0.1 10*3/uL (ref 0.0–0.7)
EOS PCT: 1 %
HCT: 40 % (ref 39.0–52.0)
Hemoglobin: 13.5 g/dL (ref 13.0–17.0)
Lymphocytes Relative: 26 %
Lymphs Abs: 2 10*3/uL (ref 0.7–4.0)
MCH: 31.5 pg (ref 26.0–34.0)
MCHC: 33.8 g/dL (ref 30.0–36.0)
MCV: 93.2 fL (ref 78.0–100.0)
Monocytes Absolute: 0.6 10*3/uL (ref 0.1–1.0)
Monocytes Relative: 7 %
Neutro Abs: 4.9 10*3/uL (ref 1.7–7.7)
Neutrophils Relative %: 66 %
PLATELETS: 249 10*3/uL (ref 150–400)
RBC: 4.29 MIL/uL (ref 4.22–5.81)
RDW: 13.6 % (ref 11.5–15.5)
WBC: 7.6 10*3/uL (ref 4.0–10.5)

## 2016-05-29 LAB — BASIC METABOLIC PANEL
ANION GAP: 9 (ref 5–15)
BUN: 21 mg/dL — ABNORMAL HIGH (ref 6–20)
CO2: 27 mmol/L (ref 22–32)
Calcium: 9.2 mg/dL (ref 8.9–10.3)
Chloride: 98 mmol/L — ABNORMAL LOW (ref 101–111)
Creatinine, Ser: 1.14 mg/dL (ref 0.61–1.24)
GFR calc Af Amer: >60 mL/min (ref >60)
GFR, EST NON AFRICAN AMERICAN: 58 mL/min — AB (ref >60)
Glucose, Bld: 124 mg/dL — ABNORMAL HIGH (ref 65–99)
POTASSIUM: 3.9 mmol/L (ref 3.5–5.1)
SODIUM: 134 mmol/L — AB (ref 135–145)

## 2016-05-29 LAB — I-STAT TROPONIN, ED: TROPONIN I, POC: 0.01 ng/mL (ref 0.00–0.08)

## 2016-05-29 NOTE — ED Notes (Signed)
Patient transported to X-ray 

## 2016-05-29 NOTE — ED Provider Notes (Signed)
[facility] DEPT Provider Note   CSN: [id number] Arrival date & time: 05/29/16  2040     History   Chief Complaint Chief Complaint  Patient presents with  . Hypotension  . Chest Pain    HPI Alexander Farley is a 82 y.o. male.   Chest Pain   This is a recurrent problem. The current episode started less than 1 hour ago. The problem occurs constantly. The problem has been gradually improving. The pain is associated with rest. The pain is present in the substernal region. The pain is moderate. The quality of the pain is described as pressure-like. The pain does not radiate. Associated symptoms include syncope (after taking nitroglycerin). Pertinent negatives include no abdominal pain, no back pain, no cough, no diaphoresis, no fever, no lower extremity edema, no nausea, no palpitations, no shortness of breath and no vomiting. He has tried nitroglycerin for the symptoms. The treatment provided no relief.  His past medical history is significant for CAD, hyperlipidemia and hypertension.  Pertinent negatives for past medical history include no seizures.    Past Medical History:  Diagnosis Date  . Anxiety   . Back pain   . Carotid stenosis    Carotid US (3/15):  Bilateral ICA 1-39%  . Coronary artery disease    a.  hx stent to LAD;  b. LHC (2/11):  LAD with 70 and 80% ISR, D1 90%, CFX 30-40%, RCA 30-40% >>> CABG (L-LAD, S-OM)  . Dyslipidemia   . ED (erectile dysfunction)   . History of shingles    post herpetic neuralgia (L chest)  . Hx of cardiovascular stress test 02/2014   Normal study with no ischemia.  LVF was normal with EF 66%.  Marland Kitchen Hx of echocardiogram     Echo (10/05):  EF 55-60%  . Hypertension   . Palpitations   . Paroxysmal atrial fibrillation (HCC)   . RBBB plus LA hemiblock     Patient Active Problem List   Diagnosis Date Noted  . S/P craniotomy 05/19/2016  . Subdural hematoma ([place]) 05/15/2016  . HLD (hyperlipidemia) 04/23/2014  . Shingles 04/23/2014  .  Unstable angina, sounds anginal, neg. nuc, may be GI component  03/27/2014  . Generalized weakness 11/16/2013  . Near syncope 11/16/2013  . PAF (paroxysmal atrial fibrillation) ([place]) 11/16/2013  . HTN (hypertension) 05/03/2011  . CAD (coronary artery disease), S/p stenting of his ostial LAD- 2006.  CABG 2011   02/02/2011  . Dizziness - light-headed 02/01/2011  . Arm numbness 02/01/2011    Past Surgical History:  Procedure Laterality Date  . APPLICATION OF CRANIAL NAVIGATION N/A 05/19/2016   Procedure: APPLICATION OF CRANIAL NAVIGATION;  Surgeon: Eustace Moore, MD;  Location: [facility];  Service: Neurosurgery;  Laterality: N/A;  . CARDIAC CATHETERIZATION  10/12/2009   NORMAL LEFT VENTRICULAR SYSTOLIC FUNCTION. EF 60-65%  . CORONARY ANGIOPLASTY WITH STENT PLACEMENT    . CORONARY ARTERY BYPASS GRAFT     FEb. 2011  . CRANIOTOMY N/A 05/19/2016   Procedure: CRANIOTOMY HEMATOMA EVACUATION SUBDURAL with brain lab;  Surgeon: Eustace Moore, MD;  Location: [place] [facility];  Service: Neurosurgery;  Laterality: N/A;  . EYE SURGERY  02-07-11   Right Eye       Home Medications    Prior to Admission medications   Medication Sig Start Date End Date Taking? Authorizing Provider  aspirin 81 MG tablet Take 81 mg by mouth daily.     Historical Provider, MD  atorvastatin (LIPITOR) 80 MG tablet  Take 1 tablet (80 mg total) by mouth daily. 11/27/15   Thayer Headings, MD  carvedilol (COREG) 3.125 MG tablet Take 3.125 mg by mouth 2 (two) times daily with a meal.    Historical Provider, MD  Cyanocobalamin (B-12) 1000 MCG TBCR Take 1,000 mcg by mouth daily.  08/15/14   Thayer Headings, MD  folic acid (FOLVITE) 1 MG tablet Take 1 mg by mouth daily.      Historical Provider, MD  hydrochlorothiazide (HYDRODIURIL) 25 MG tablet TAKE 1 TABLET (25 MG TOTAL) BY MOUTH DAILY. 12/18/15   Thayer Headings, MD  lisinopril (PRINIVIL,ZESTRIL) 10 MG tablet Take 1 tablet (10 mg total) by mouth 2 (two) times daily. 08/15/14    Thayer Headings, MD  Multiple Vitamin (MULTIVITAMIN) capsule Take 1 capsule by mouth daily.      Historical Provider, MD  nitroGLYCERIN (NITROSTAT) 0.4 MG SL tablet Place 0.4 mg under the tongue every 5 (five) minutes as needed for chest pain.     Historical Provider, MD  potassium chloride SA (K-DUR,KLOR-CON) 20 MEQ tablet Take 1 tablet (20 mEq total) by mouth daily. 06/24/15   Thayer Headings, MD  sildenafil (REVATIO) 20 MG tablet Take 20 mg by mouth as needed (for ED).    Historical Provider, MD  Tamsulosin HCl (FLOMAX) 0.4 MG CAPS Take 0.4 mg by mouth daily.      Historical Provider, MD  traMADol (ULTRAM) 50 MG tablet Take 1 tablet (50 mg total) by mouth every 6 (six) hours as needed for moderate pain. 04/16/16   Bjorn Pippin, PA-C    Family History Family History  Problem Relation Age of Onset  . Stroke Mother   . Stroke Father   . Heart attack Neg Hx     Social History Social History  Substance Use Topics  . Smoking status: Former Smoker    Quit date: 01/30/1985  . Smokeless tobacco: Never Used  . Alcohol use No     Allergies   Zocor [simvastatin]   Review of Systems Review of Systems  Constitutional: Negative for chills, diaphoresis and fever.  HENT: Negative for ear pain and sore throat.   Eyes: Negative for pain and visual disturbance.  Respiratory: Negative for cough and shortness of breath.   Cardiovascular: Positive for chest pain and syncope (after taking nitroglycerin). Negative for palpitations.  Gastrointestinal: Negative for abdominal pain, nausea and vomiting.  Genitourinary: Negative for dysuria and hematuria.  Musculoskeletal: Negative for arthralgias and back pain.  Skin: Negative for color change and rash.  Neurological: Positive for syncope. Negative for seizures.  All other systems reviewed and are negative.    Physical Exam Updated Vital Signs BP 105/69 (BP Location: Right Arm)   Pulse 77   Temp 97.5 F (36.4 C) (Oral)   Resp 21   SpO2  99%   Physical Exam  Constitutional: He is oriented to person, place, and time. He appears well-developed and well-nourished.  HENT:  Head: Normocephalic and atraumatic.  Eyes: Conjunctivae and EOM are normal. Pupils are equal, round, and reactive to light.  Neck: Neck supple.  Cardiovascular: Normal rate.   Irregular rhythm  Pulmonary/Chest: Effort normal and breath sounds normal. No respiratory distress.  Abdominal: Soft. There is no tenderness.  Musculoskeletal: He exhibits no edema.  Neurological: He is alert and oriented to person, place, and time.  Skin: Skin is warm and dry.  Nursing note and vitals reviewed.    ED Treatments / Results  Labs (all labs  ordered are listed, but only abnormal results are displayed) Labs Reviewed  BASIC METABOLIC PANEL - Abnormal; Notable for the following:       Result Value   Sodium 134 (*)    Chloride 98 (*)    Glucose, Bld 124 (*)    BUN 21 (*)    GFR calc non Af Amer 58 (*)    All other components within normal limits  URINALYSIS, ROUTINE W REFLEX MICROSCOPIC (NOT AT [facility]) - Abnormal; Notable for the following:    Ketones, ur 15 (*)    All other components within normal limits  GLUCOSE, CAPILLARY - Abnormal; Notable for the following:    Glucose-Capillary 134 (*)    All other components within normal limits  CBC WITH DIFFERENTIAL/PLATELET  TROPONIN I  TROPONIN I  TROPONIN I  I-STAT TROPOININ, ED  I-STAT TROPOININ, ED    EKG  EKG Interpretation  Date/Time:  Sunday May 29 2016 20:48:24 EDT Ventricular Rate:  78 PR Interval:    QRS Duration: 150 QT Interval:  442 QTC Calculation: 504 R Axis:   -73 Text Interpretation:  Unknown rhythm, irregular rate RBBB and LAFB No significant change since last tracing Confirmed by FLOYD MD, DANIEL [phone]) on 05/29/2016 8:59:55 PM       Radiology Dg Chest 2 View  Result Date: 05/29/2016 CLINICAL DATA:  Chest pain, hypotension, and fainting. Dizziness and weakness. EXAM: CHEST  2  VIEW COMPARISON:  04/16/2016 FINDINGS: Postoperative changes in the mediastinum. Normal heart size and pulmonary vascularity. No focal airspace disease or consolidation in the lungs. No blunting of costophrenic angles. No pneumothorax. Mediastinal contours appear intact. Tortuous aorta. Degenerative changes in the spine. IMPRESSION: No active cardiopulmonary disease. Electronically Signed   By: Lucienne Capers M.D.   On: 05/29/2016 22:18    Procedures Procedures (including critical care time)  Medications Ordered in ED Medications - No data to display   Initial Impression / Assessment and Plan / ED Course  I have reviewed the triage vital signs and the nursing notes.  Pertinent labs & imaging results that were available during my care of the patient were reviewed by me and considered in my medical decision making (see chart for details).  Clinical Course   Mr. Peaster is an 82 year old male with past medical history significant for carotid artery disease with stent placement, hypertension, A. fib, subdural hematoma with recent craniotomy, CABG who presents for sudden onset chest pain while at rest.  He took nitroglycerin for the chest pain and had an episode of syncope.  He was sitting out of bed and fell back into the bed.  No symptoms consistent with seizure.  Labs ordered including CBC, BMP, UA, troponin.  Results significant for negative troponins.  Chest x-ray ordered, personally reviewed by me, demonstrates no acute cardiac or pulmonary processes.  EKG obtained, demonstrates irregular rhythm with right bundle branch block and left anterior fascicular block; similar to priors.  Patient is admitted to stepdown unit for continued chest pain and syncope evaluation.  Final Clinical Impressions(s) / ED Diagnoses   Final diagnoses:  Chest pain, unspecified type    New Prescriptions New Prescriptions   No medications on file     Elveria Rising, MD 05/30/16 Kathleen,  DO 05/30/16 2055

## 2016-05-29 NOTE — ED Triage Notes (Signed)
Pt presents from home via [facility] for syncope after taking one SL NTG for CP; pt states his CP is central and non-radiating rated as 8/10; pt states he took on NTG for CP and then became hypotensive and fainted; wife called 911; pt reports recent discharge for brain surgery; pt A&O now and reports dizziness and weakness

## 2016-05-30 ENCOUNTER — Observation Stay (HOSPITAL_COMMUNITY): Payer: Medicare Other

## 2016-05-30 ENCOUNTER — Observation Stay (HOSPITAL_BASED_OUTPATIENT_CLINIC_OR_DEPARTMENT_OTHER): Payer: Medicare Other

## 2016-05-30 DIAGNOSIS — I6200 Nontraumatic subdural hemorrhage, unspecified: Secondary | ICD-10-CM

## 2016-05-30 DIAGNOSIS — R079 Chest pain, unspecified: Secondary | ICD-10-CM | POA: Diagnosis not present

## 2016-05-30 DIAGNOSIS — Z9889 Other specified postprocedural states: Secondary | ICD-10-CM

## 2016-05-30 DIAGNOSIS — R531 Weakness: Secondary | ICD-10-CM | POA: Diagnosis not present

## 2016-05-30 DIAGNOSIS — I2510 Atherosclerotic heart disease of native coronary artery without angina pectoris: Secondary | ICD-10-CM

## 2016-05-30 DIAGNOSIS — I10 Essential (primary) hypertension: Secondary | ICD-10-CM | POA: Diagnosis not present

## 2016-05-30 DIAGNOSIS — R55 Syncope and collapse: Secondary | ICD-10-CM | POA: Diagnosis present

## 2016-05-30 LAB — GLUCOSE, CAPILLARY
GLUCOSE-CAPILLARY: 110 mg/dL — AB (ref 65–99)
Glucose-Capillary: 134 mg/dL — ABNORMAL HIGH (ref 65–99)
Glucose-Capillary: 163 mg/dL — ABNORMAL HIGH (ref 65–99)

## 2016-05-30 LAB — TROPONIN I

## 2016-05-30 LAB — URINALYSIS, ROUTINE W REFLEX MICROSCOPIC
Bilirubin Urine: NEGATIVE
GLUCOSE, UA: NEGATIVE mg/dL
Hgb urine dipstick: NEGATIVE
Ketones, ur: 15 mg/dL — AB
LEUKOCYTES UA: NEGATIVE
NITRITE: NEGATIVE
PH: 5.5 (ref 5.0–8.0)
Protein, ur: NEGATIVE mg/dL
SPECIFIC GRAVITY, URINE: 1.02 (ref 1.005–1.030)

## 2016-05-30 LAB — I-STAT TROPONIN, ED: Troponin i, poc: 0.01 ng/mL (ref 0.00–0.08)

## 2016-05-30 LAB — ECHOCARDIOGRAM COMPLETE

## 2016-05-30 MED ORDER — TRAMADOL HCL 50 MG PO TABS
50.0000 mg | ORAL_TABLET | Freq: Four times a day (QID) | ORAL | Status: DC | PRN
  Administered 2016-05-30 – 2016-05-31 (×3): 50 mg via ORAL
  Filled 2016-05-30 (×3): qty 1

## 2016-05-30 MED ORDER — LEVETIRACETAM 250 MG PO TABS
250.0000 mg | ORAL_TABLET | Freq: Two times a day (BID) | ORAL | Status: DC
  Administered 2016-05-30 – 2016-06-01 (×5): 250 mg via ORAL
  Filled 2016-05-30 (×5): qty 1

## 2016-05-30 MED ORDER — TAMSULOSIN HCL 0.4 MG PO CAPS
0.4000 mg | ORAL_CAPSULE | Freq: Every day | ORAL | Status: DC
  Administered 2016-05-30 – 2016-06-01 (×3): 0.4 mg via ORAL
  Filled 2016-05-30 (×3): qty 1

## 2016-05-30 MED ORDER — CARVEDILOL 3.125 MG PO TABS
3.1250 mg | ORAL_TABLET | Freq: Two times a day (BID) | ORAL | Status: DC
Start: 2016-05-30 — End: 2016-06-01
  Administered 2016-05-30 – 2016-06-01 (×5): 3.125 mg via ORAL
  Filled 2016-05-30 (×6): qty 1

## 2016-05-30 MED ORDER — PERFLUTREN LIPID MICROSPHERE
1.0000 mL | INTRAVENOUS | Status: AC | PRN
  Administered 2016-05-30: 3 mL via INTRAVENOUS
  Filled 2016-05-30: qty 10

## 2016-05-30 MED ORDER — SODIUM CHLORIDE 0.9 % IV SOLN
INTRAVENOUS | Status: DC
  Administered 2016-05-30 – 2016-05-31 (×3): via INTRAVENOUS

## 2016-05-30 MED ORDER — ATORVASTATIN CALCIUM 80 MG PO TABS
80.0000 mg | ORAL_TABLET | Freq: Every day | ORAL | Status: DC
  Administered 2016-05-30 – 2016-06-01 (×3): 80 mg via ORAL
  Filled 2016-05-30 (×3): qty 1

## 2016-05-30 MED ORDER — PERFLUTREN LIPID MICROSPHERE
INTRAVENOUS | Status: AC
Start: 2016-05-30 — End: 2016-05-30
  Administered 2016-05-30: 10:00:00
  Filled 2016-05-30: qty 10

## 2016-05-30 MED ORDER — ASPIRIN EC 81 MG PO TBEC
81.0000 mg | DELAYED_RELEASE_TABLET | Freq: Every day | ORAL | Status: DC
  Administered 2016-05-30 – 2016-06-01 (×3): 81 mg via ORAL
  Filled 2016-05-30 (×3): qty 1

## 2016-05-30 MED ORDER — ENSURE ENLIVE PO LIQD
237.0000 mL | Freq: Two times a day (BID) | ORAL | Status: DC
  Administered 2016-05-30 – 2016-05-31 (×2): 237 mL via ORAL

## 2016-05-30 MED ORDER — ONDANSETRON HCL 4 MG/2ML IJ SOLN: 4.0000 mg | Freq: Four times a day (QID) | INTRAMUSCULAR | Status: DC | PRN

## 2016-05-30 MED ORDER — VITAMIN B-12 1000 MCG PO TABS
500.0000 ug | ORAL_TABLET | Freq: Two times a day (BID) | ORAL | Status: DC
  Administered 2016-05-30 – 2016-06-01 (×5): 500 ug via ORAL
  Filled 2016-05-30 (×3): qty 1
  Filled 2016-05-30: qty 0.5
  Filled 2016-05-30 (×2): qty 1

## 2016-05-30 MED ORDER — POTASSIUM CHLORIDE CRYS ER 10 MEQ PO TBCR
10.0000 meq | EXTENDED_RELEASE_TABLET | Freq: Every day | ORAL | Status: DC
  Administered 2016-05-30 – 2016-06-01 (×3): 10 meq via ORAL
  Filled 2016-05-30 (×3): qty 1

## 2016-05-30 MED ORDER — PNEUMOCOCCAL VAC POLYVALENT 25 MCG/0.5ML IJ INJ
0.5000 mL | INJECTION | INTRAMUSCULAR | Status: AC
  Administered 2016-05-31: 0.5 mL via INTRAMUSCULAR
  Filled 2016-05-30: qty 0.5

## 2016-05-30 MED ORDER — FOLIC ACID 1 MG PO TABS
1.0000 mg | ORAL_TABLET | Freq: Every day | ORAL | Status: DC
  Administered 2016-05-30 – 2016-06-01 (×3): 1 mg via ORAL
  Filled 2016-05-30 (×3): qty 1

## 2016-05-30 MED ORDER — LORAZEPAM 1 MG PO TABS: 1.0000 mg | ORAL_TABLET | Freq: Two times a day (BID) | ORAL | Status: DC | PRN

## 2016-05-30 MED ORDER — LISINOPRIL 10 MG PO TABS
10.0000 mg | ORAL_TABLET | Freq: Two times a day (BID) | ORAL | Status: DC
  Administered 2016-05-30 (×2): 10 mg via ORAL
  Filled 2016-05-30: qty 2
  Filled 2016-05-30: qty 1

## 2016-05-30 MED ORDER — ACETAMINOPHEN 325 MG PO TABS: 650.0000 mg | ORAL_TABLET | ORAL | Status: DC | PRN

## 2016-05-30 MED ORDER — ENOXAPARIN SODIUM 40 MG/0.4ML ~~LOC~~ SOLN: 40.0000 mg | Freq: Every day | SUBCUTANEOUS | Status: DC

## 2016-05-30 MED ORDER — POLYVINYL ALCOHOL 1.4 % OP SOLN: 1.0000 [drp] | Freq: Every day | OPHTHALMIC | Status: DC | PRN

## 2016-05-30 NOTE — Progress Notes (Signed)
Patient ID: Alexander Farley, male   DOB: August 08, 1933, 82 y.o.   MRN: [id number] Events noted.  NO LOVENOX PLEASE GIVEN RECENT CNS SURGERY AND KNOWN SDH. ASA ok for now. At [facility].  Added keppra at low dose in case this was sz related. Certainly with B SDH and recent surgery he is at risk  Will check CT head

## 2016-05-30 NOTE — Progress Notes (Signed)
PT Cancellation Note  Patient Details Name: Alexander Farley MRN: [id number] DOB: 1933/03/22   Cancelled Treatment:    Reason Eval/Treat Not Completed: Patient at procedure or test/unavailable. Pt off the floor for CT. Will reattempt when he returns to room   Midas Daughety 05/30/2016, 2:06 PM  Pager [phone]
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
## 2016-05-30 NOTE — Progress Notes (Signed)
[facility]  Patient Status: Active (receiving services up to time of hospitalization)  [facility] is providing the following services: PT and OT  If patient discharges after hours, please call [phone].   Alexander Farley 05/30/2016, 11:09 AM

## 2016-05-30 NOTE — Progress Notes (Signed)
Echocardiogram 2D Echocardiogram has been performed with definity.  Aggie Cosier 05/30/2016, 9:29 AM

## 2016-05-30 NOTE — Progress Notes (Signed)
Patient admitted after midnight, please see H&P.  Echo ok.  Patient took nitro and then became orthostatic.  Doubt seizure but possible as patient recent had surgery for B SDH-- keppra added.  PT Eval-- suspect patient will need placement as walking has been difficult since surgery.  Social work consult.  Eulogio Bear
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
## 2016-05-30 NOTE — ED Notes (Signed)
Attempted report x 1; name and call back number provided 

## 2016-05-31 DIAGNOSIS — R531 Weakness: Secondary | ICD-10-CM

## 2016-05-31 DIAGNOSIS — E440 Moderate protein-calorie malnutrition: Secondary | ICD-10-CM | POA: Insufficient documentation

## 2016-05-31 DIAGNOSIS — F419 Anxiety disorder, unspecified: Secondary | ICD-10-CM | POA: Diagnosis not present

## 2016-05-31 DIAGNOSIS — R079 Chest pain, unspecified: Secondary | ICD-10-CM | POA: Diagnosis not present

## 2016-05-31 DIAGNOSIS — I10 Essential (primary) hypertension: Secondary | ICD-10-CM | POA: Diagnosis not present

## 2016-05-31 DIAGNOSIS — R55 Syncope and collapse: Secondary | ICD-10-CM | POA: Diagnosis not present

## 2016-05-31 DIAGNOSIS — I6200 Nontraumatic subdural hemorrhage, unspecified: Secondary | ICD-10-CM | POA: Diagnosis not present

## 2016-05-31 DIAGNOSIS — I2510 Atherosclerotic heart disease of native coronary artery without angina pectoris: Secondary | ICD-10-CM | POA: Diagnosis not present

## 2016-05-31 LAB — CBC
HCT: 39 % (ref 39.0–52.0)
HEMOGLOBIN: 12.6 g/dL — AB (ref 13.0–17.0)
MCH: 30.4 pg (ref 26.0–34.0)
MCHC: 32.3 g/dL (ref 30.0–36.0)
MCV: 94.2 fL (ref 78.0–100.0)
Platelets: 254 10*3/uL (ref 150–400)
RBC: 4.14 MIL/uL — AB (ref 4.22–5.81)
RDW: 13.5 % (ref 11.5–15.5)
WBC: 6.4 10*3/uL (ref 4.0–10.5)

## 2016-05-31 LAB — BASIC METABOLIC PANEL
ANION GAP: 5 (ref 5–15)
BUN: 19 mg/dL (ref 6–20)
CHLORIDE: 103 mmol/L (ref 101–111)
CO2: 26 mmol/L (ref 22–32)
Calcium: 8.8 mg/dL — ABNORMAL LOW (ref 8.9–10.3)
Creatinine, Ser: 0.86 mg/dL (ref 0.61–1.24)
GFR calc non Af Amer: >60 mL/min (ref >60)
Glucose, Bld: 99 mg/dL (ref 65–99)
POTASSIUM: 3.9 mmol/L (ref 3.5–5.1)
SODIUM: 134 mmol/L — AB (ref 135–145)

## 2016-05-31 MED ORDER — POLYETHYLENE GLYCOL 3350 17 G PO PACK
17.0000 g | PACK | Freq: Every day | ORAL | Status: DC
  Administered 2016-05-31 – 2016-06-01 (×2): 17 g via ORAL
  Filled 2016-05-31 (×2): qty 1

## 2016-05-31 MED ORDER — LISINOPRIL 2.5 MG PO TABS
2.5000 mg | ORAL_TABLET | Freq: Two times a day (BID) | ORAL | Status: DC
  Administered 2016-05-31 – 2016-06-01 (×3): 2.5 mg via ORAL
  Filled 2016-05-31 (×3): qty 1

## 2016-05-31 MED ORDER — ENSURE ENLIVE PO LIQD
237.0000 mL | Freq: Three times a day (TID) | ORAL | Status: DC
  Administered 2016-05-31: 237 mL via ORAL

## 2016-05-31 MED ORDER — BOOST / RESOURCE BREEZE PO LIQD
1.0000 | Freq: Three times a day (TID) | ORAL | Status: DC
  Administered 2016-05-31 – 2016-06-01 (×2): 1 via ORAL

## 2016-05-31 NOTE — Clinical Social Work Placement (Signed)
   CLINICAL SOCIAL WORK PLACEMENT  NOTE  Date:  05/31/2016  Patient Details  Name: STIHL BRUSSO MRN: [id number] Date of Birth: 1932-12-08  Clinical Social Work is seeking post-discharge placement for this patient at the [place] level of care (*CSW will initial, date and re-position this form in  chart as items are completed):  Yes   Patient/family provided with [place] Work Department's list of facilities offering this level of care within the geographic area requested by the patient (or if unable, by the patient's family).  Yes   Patient/family informed of their freedom to choose among providers that offer the needed level of care, that participate in Medicare, Medicaid or managed care program needed by the patient, have an available bed and are willing to accept the patient.  Yes   Patient/family informed of [place]'s ownership interest in [facility] and [facility], as well as of the fact that they are under no obligation to receive care at these facilities.  PASRR submitted to EDS on 05/31/16     PASRR number received on 05/31/16     Existing PASRR number confirmed on       FL2 transmitted to all facilities in geographic area requested by pt/family on 05/31/16     FL2 transmitted to all facilities within larger geographic area on       Patient informed that his/her managed care company has contracts with or will negotiate with certain facilities, including the following:            Patient/family informed of bed offers received.  Patient chooses bed at       Physician recommends and patient chooses bed at      Patient to be transferred to   on  .  Patient to be transferred to facility by       Patient family notified on   of transfer.  Name of family member notified:        PHYSICIAN Please sign FL2     Additional Comment:    _______________________________________________ Candie Chroman, LCSW 05/31/2016, 5:01  PM
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
## 2016-05-31 NOTE — Clinical Social Work Note (Signed)
CSW went by patient's room for assessment and to discuss SNF recommendation. Patient's daughter at bedside. CSW told her that CSW will return after lunch because patient had just fallen asleep. CSW provided SNF list for patient and his daughter to review when he woke up.  Alexander Farley, [place]
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
## 2016-05-31 NOTE — Clinical Social Work Note (Signed)
Clinical Social Work Assessment  Patient Details  Name: Alexander Farley MRN: [id number] Date of Birth: Mar 09, 1933  Date of referral:  05/30/16               Reason for consult:  Facility Placement, Discharge Planning                Permission sought to share information with:  Facility Sport and exercise psychologist, Family Supports Permission granted to share information::  Yes, Verbal Permission Granted  Name::     Emergency planning/management officer::  SNF's  Relationship::  Wife  Contact Information:  [phone]  Housing/Transportation Living arrangements for the past 2 months:  Single Family Home Source of Information:  Patient, Medical Team, Adult Children, Spouse Patient Interpreter Needed:  None Criminal Activity/Legal Involvement Pertinent to Current Situation/Hospitalization:  No - Comment as needed Significant Relationships:  Adult Children, Spouse Lives with:  Spouse Do you feel safe going back to the place where you live?  Yes Need for family participation in patient care:  Yes (Comment)  Care giving concerns:  PT recommending SNF once medically stable for discharge.   Social Worker assessment / plan:  CSW met with patient. Wife and daughter at bedside. CSW introduced role and explained that PT recommendations would be discussed. Patient and his family agreeable to SNF placement. First preference is [facility] and second is [facility]. Patient will need PTAR. No further concerns. CSW encouraged patient and his family to contact CSW as needed. CSW will continue to follow patient for support and facilitate discharge to SNF once medically stable.  Employment status:  Retired Nurse, adult PT Recommendations:  [place] / Referral to community resources:  [place]  Patient/Family's Response to care:  Patient and his family agreeable to SNF placement. Patient's family supportive and involved in patient's care.  Patient and his family appreciated social work intervention.  Patient/Family's Understanding of and Emotional Response to Diagnosis, Current Treatment, and Prognosis:  Patient and his family understand the need for rehab prior to returning home. Patient and his family appear happy with hospital care.  Emotional Assessment Appearance:  Appears stated age Attitude/Demeanor/Rapport:  Other (Pleasant) Affect (typically observed):  Accepting, Appropriate, Calm, Pleasant Orientation:  Oriented to Self, Oriented to Place, Oriented to  Time, Oriented to Situation Alcohol / Substance use:  Never Used Psych involvement (Current and /or in the community):  No (Comment)  Discharge Needs  Concerns to be addressed:  Care Coordination Readmission within the last 30 days:  Yes Current discharge risk:  Dependent with Mobility Barriers to Discharge:  No Barriers Identified   Alexander Chroman, LCSW 05/31/2016, 4:55 PM

## 2016-05-31 NOTE — NC FL2 (Signed)
[place] LEVEL OF CARE SCREENING TOOL     IDENTIFICATION  Patient Name: Alexander Farley Birthdate: [birth year] Sex: male Admission Date (Current Location): 05/29/2016  [facility] and [place] Number:  Herbalist and Address:  [facility] [place], [place] [address] [place], [place] [address]      Provider Number: [id number]  Attending Physician Name and Address:  Geradine Girt, DO  Relative Name and Phone Number:       Current Level of Care: Hospital Recommended Level of Care: [place] Prior Approval Number:    Date Approved/Denied:   PASRR Number: [id number]  Discharge Plan: SNF    Current Diagnoses: Patient Active Problem List   Diagnosis Date Noted  . Syncope 05/30/2016  . Chest pain 05/30/2016  . S/P craniotomy 05/19/2016  . Subdural hematoma ([place]) 05/15/2016  . HLD (hyperlipidemia) 04/23/2014  . Shingles 04/23/2014  . Unstable angina, sounds anginal, neg. nuc, may be GI component  03/27/2014  . Generalized weakness 11/16/2013  . Near syncope 11/16/2013  . PAF (paroxysmal atrial fibrillation) ([place]) 11/16/2013  . HTN (hypertension) 05/03/2011  . CAD (coronary artery disease), S/p stenting of his ostial LAD- 2006.  CABG 2011   02/02/2011  . Dizziness - light-headed 02/01/2011  . Arm numbness 02/01/2011    Orientation RESPIRATION BLADDER Height & Weight     Self, Time, Situation, Place  Normal Continent Weight: 144 lb 8 oz (65.5 kg) Height:  5\' 9"  (175.3 cm)  BEHAVIORAL SYMPTOMS/MOOD NEUROLOGICAL BOWEL NUTRITION STATUS   (None)  (None) Continent Diet (Heart healthy)  AMBULATORY STATUS COMMUNICATION OF NEEDS Skin   Extensive Assist Verbally Surgical wounds                       Personal Care Assistance Level of Assistance  Bathing, Feeding, Dressing Bathing Assistance: Limited assistance Feeding assistance: Independent Dressing Assistance: Limited assistance     Functional Limitations Info  Sight,  Hearing, Speech Sight Info: Adequate Hearing Info: Adequate Speech Info: Adequate    SPECIAL CARE FACTORS FREQUENCY  PT (By licensed PT), Blood pressure, OT (By licensed OT)     PT Frequency: 5 x week OT Frequency: 5 x week            Contractures Contractures Info: Not present    Additional Factors Info  Code Status, Allergies Code Status Info: Full Allergies Info: Zocor (Simvastatin)           Current Medications (05/31/2016):  This is the current hospital active medication list Current Facility-Administered Medications  Medication Dose Route Frequency Provider Last Rate Last Dose  . 0.9 %  sodium chloride infusion   Intravenous Continuous Geradine Girt, DO 50 mL/hr at 05/31/16 1524    . acetaminophen (TYLENOL) tablet 650 mg  650 mg Oral Q4H PRN Norval Morton, MD      . aspirin EC tablet 81 mg  81 mg Oral Daily Norval Morton, MD   81 mg at 05/31/16 1021  . atorvastatin (LIPITOR) tablet 80 mg  80 mg Oral Daily Norval Morton, MD   80 mg at 05/31/16 1021  . carvedilol (COREG) tablet 3.125 mg  3.125 mg Oral BID WC Rondell Charmayne Sheer, MD   3.125 mg at 05/31/16 1021  . feeding supplement (BOOST / RESOURCE BREEZE) liquid 1 Container  1 Container Oral TID BM Jessica U Vann, DO      . feeding supplement (ENSURE ENLIVE) (ENSURE ENLIVE)  liquid 237 mL  237 mL Oral TID BM Geradine Girt, DO      . folic acid (FOLVITE) tablet 1 mg  1 mg Oral Daily Rondell Charmayne Sheer, MD   1 mg at 05/31/16 1021  . levETIRAcetam (KEPPRA) tablet 250 mg  250 mg Oral BID Eustace Moore, MD   250 mg at 05/31/16 1021  . lisinopril (PRINIVIL,ZESTRIL) tablet 2.5 mg  2.5 mg Oral BID Geradine Girt, DO   2.5 mg at 05/31/16 1021  . LORazepam (ATIVAN) tablet 1 mg  1 mg Oral BID PRN Norval Morton, MD      . ondansetron (ZOFRAN) injection 4 mg  4 mg Intravenous Q6H PRN Rondell A Smith, MD      . polyethylene glycol (MIRALAX / GLYCOLAX) packet 17 g  17 g Oral Daily Jessica U Vann, DO   17 g at 05/31/16 1018  .  polyvinyl alcohol (LIQUIFILM TEARS) 1.4 % ophthalmic solution 1 drop  1 drop Both Eyes Daily PRN Rondell A Tamala Julian, MD      . potassium chloride (K-DUR,KLOR-CON) CR tablet 10 mEq  10 mEq Oral Daily Norval Morton, MD   10 mEq at 05/31/16 1021  . tamsulosin (FLOMAX) capsule 0.4 mg  0.4 mg Oral Daily Norval Morton, MD   0.4 mg at 05/31/16 1021  . traMADol (ULTRAM) tablet 50 mg  50 mg Oral Q6H PRN Norval Morton, MD   50 mg at 05/30/16 2017  . vitamin B-12 (CYANOCOBALAMIN) tablet 500 mcg  500 mcg Oral BID Norval Morton, MD   500 mcg at 05/31/16 1021     Discharge Medications: Please see discharge summary for a list of discharge medications.  Relevant Imaging Results:  Relevant Lab Results:   Additional Information SS#: [id number]  Candie Chroman, LCSW

## 2016-06-01 DIAGNOSIS — R079 Chest pain, unspecified: Secondary | ICD-10-CM | POA: Diagnosis not present

## 2016-06-01 DIAGNOSIS — E440 Moderate protein-calorie malnutrition: Secondary | ICD-10-CM | POA: Diagnosis not present

## 2016-06-01 DIAGNOSIS — I10 Essential (primary) hypertension: Secondary | ICD-10-CM | POA: Diagnosis not present

## 2016-06-01 DIAGNOSIS — I6200 Nontraumatic subdural hemorrhage, unspecified: Secondary | ICD-10-CM | POA: Diagnosis not present

## 2016-06-01 LAB — GLUCOSE, CAPILLARY
GLUCOSE-CAPILLARY: 93 mg/dL (ref 65–99)
Glucose-Capillary: 93 mg/dL (ref 65–99)

## 2016-06-01 MED ORDER — LEVETIRACETAM 250 MG PO TABS: 250.0000 mg | ORAL_TABLET | Freq: Two times a day (BID) | ORAL | Status: DC

## 2016-06-01 MED ORDER — LISINOPRIL 2.5 MG PO TABS: 2.5000 mg | ORAL_TABLET | Freq: Two times a day (BID) | ORAL | Status: DC

## 2016-06-01 MED ORDER — LORAZEPAM 1 MG PO TABS: 1.0000 mg | ORAL_TABLET | Freq: Two times a day (BID) | ORAL | 0 refills | Status: DC | PRN

## 2016-06-01 MED ORDER — POLYETHYLENE GLYCOL 3350 17 G PO PACK: 17.0000 g | PACK | Freq: Every day | ORAL | 0 refills | Status: DC

## 2016-06-01 MED ORDER — ACETAMINOPHEN 325 MG PO TABS: 650.0000 mg | ORAL_TABLET | ORAL | Status: DC | PRN

## 2016-06-01 MED ORDER — POTASSIUM CHLORIDE CRYS ER 10 MEQ PO TBCR: 10.0000 meq | EXTENDED_RELEASE_TABLET | Freq: Every day | ORAL | Status: DC

## 2016-06-01 MED ORDER — BOOST / RESOURCE BREEZE PO LIQD: 1.0000 | Freq: Three times a day (TID) | ORAL | 0 refills | Status: DC

## 2016-06-01 MED ORDER — ENSURE ENLIVE PO LIQD: 237.0000 mL | Freq: Three times a day (TID) | ORAL | 12 refills | Status: DC

## 2016-06-01 MED ORDER — TRAMADOL HCL 50 MG PO TABS: 50.0000 mg | ORAL_TABLET | Freq: Four times a day (QID) | ORAL | 0 refills | Status: DC | PRN
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
## 2016-06-01 NOTE — Clinical Social Work Placement (Signed)
   CLINICAL SOCIAL WORK PLACEMENT  NOTE  Date:  06/01/2016  Patient Details  Name: Alexander Farley MRN: [id number] Date of Birth: [birth year]  Clinical Social Work is seeking post-discharge placement for this patient at the [place] level of care (*CSW will initial, date and re-position this form in  chart as items are completed):  Yes   Patient/family provided with [facility] Work Department's list of facilities offering this level of care within the geographic area requested by the patient (or if unable, by the patient's family).  Yes   Patient/family informed of their freedom to choose among providers that offer the needed level of care, that participate in Medicare, Medicaid or managed care program needed by the patient, have an available bed and are willing to accept the patient.  Yes   Patient/family informed of 's ownership interest in [facility] and [facility], as well as of the fact that they are under no obligation to receive care at these facilities.  PASRR submitted to EDS on 05/31/16     PASRR number received on 05/31/16     Existing PASRR number confirmed on       FL2 transmitted to all facilities in geographic area requested by pt/family on 05/31/16     FL2 transmitted to all facilities within larger geographic area on       Patient informed that his/her managed care company has contracts with or will negotiate with certain facilities, including the following:        Yes   Patient/family informed of bed offers received.  Patient chooses bed at [facility] [place]     Physician recommends and patient chooses bed at      Patient to be transferred to [facility] on 06/01/16.  Patient to be transferred to facility by [facility]     Patient family notified on 06/01/16 of transfer.  Name of family member notified:  Shirlee Limerick     PHYSICIAN       Additional Comment:     _______________________________________________ Candie Chroman, LCSW 06/01/2016, 11:59 AM
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
## 2016-06-01 NOTE — Progress Notes (Signed)
Patient alert and oriented, denies pain, no shortness of breath, v/s stable iv and tele d/c. Report given to nurse at SNF, all questions answered. Patient d/c to snf via ambulance

## 2016-06-01 NOTE — Progress Notes (Signed)
Patient ID: Alexander Farley, male   DOB: 1933/07/02, 82 y.o.   MRN: [id number] Looks great, awake alert, no drift, conversant, only mild occ headache, CT looks good, staples removed

## 2016-06-01 NOTE — Clinical Social Work Note (Signed)
CSW facilitated patient discharge including contacting patient family and facility to confirm patient discharge plans. Clinical information faxed to facility and family agreeable with plan. CSW arranged ambulance transport via [facility] to [place] around 1:00 pm. RN to call report prior to discharge ([phone]).  CSW will sign off for now as social work intervention is no longer needed. Please consult [place] again if new needs arise.  [place] Scrape, [place]

## 2016-07-06 ENCOUNTER — Other Ambulatory Visit: Payer: Self-pay | Admitting: Neurological Surgery

## 2016-07-06 DIAGNOSIS — S065X9A Traumatic subdural hemorrhage with loss of consciousness of unspecified duration, initial encounter: Secondary | ICD-10-CM

## 2016-07-06 DIAGNOSIS — S065XAA Traumatic subdural hemorrhage with loss of consciousness status unknown, initial encounter: Secondary | ICD-10-CM

## 2016-07-12 ENCOUNTER — Ambulatory Visit
Admission: RE | Admit: 2016-07-12 | Discharge: 2016-07-12 | Disposition: A | Discharge status: Home / Self Care | Payer: Medicare Other | Source: Ambulatory Visit | Attending: Neurological Surgery | Admitting: Neurological Surgery

## 2016-07-12 DIAGNOSIS — S065X9A Traumatic subdural hemorrhage with loss of consciousness of unspecified duration, initial encounter: Secondary | ICD-10-CM

## 2016-07-12 DIAGNOSIS — S065XAA Traumatic subdural hemorrhage with loss of consciousness status unknown, initial encounter: Secondary | ICD-10-CM

## 2016-07-13 ENCOUNTER — Other Ambulatory Visit: Payer: Self-pay | Admitting: Cardiovascular Disease

## 2016-07-13 DIAGNOSIS — I10 Essential (primary) hypertension: Secondary | ICD-10-CM

## 2016-07-19 ENCOUNTER — Ambulatory Visit (INDEPENDENT_AMBULATORY_CARE_PROVIDER_SITE_OTHER): Payer: Medicare Other | Admitting: Cardiovascular Disease

## 2016-07-19 ENCOUNTER — Encounter: Payer: Self-pay | Admitting: Cardiovascular Disease

## 2016-07-19 VITALS — BP 120/64 | HR 82 | Ht 69.0 in | Wt 156.0 lb

## 2016-07-19 DIAGNOSIS — I2510 Atherosclerotic heart disease of native coronary artery without angina pectoris: Secondary | ICD-10-CM

## 2016-07-19 DIAGNOSIS — I10 Essential (primary) hypertension: Secondary | ICD-10-CM

## 2016-07-19 LAB — HEPATIC FUNCTION PANEL
ALBUMIN: 3.6 g/dL (ref 3.6–5.1)
ALT: 20 U/L (ref 9–46)
AST: 21 U/L (ref 10–35)
Alkaline Phosphatase: 73 U/L (ref 40–115)
BILIRUBIN INDIRECT: 0.5 mg/dL (ref 0.2–1.2)
BILIRUBIN TOTAL: 0.6 mg/dL (ref 0.2–1.2)
Bilirubin, Direct: 0.1 mg/dL (ref <=0.2)
TOTAL PROTEIN: 5.9 g/dL — AB (ref 6.1–8.1)

## 2016-07-19 LAB — BASIC METABOLIC PANEL
BUN: 10 mg/dL (ref 7–25)
CALCIUM: 8.5 mg/dL — AB (ref 8.6–10.3)
CO2: 25 mmol/L (ref 20–31)
CREATININE: 0.77 mg/dL (ref 0.70–1.11)
Chloride: 107 mmol/L (ref 98–110)
Glucose, Bld: 114 mg/dL — ABNORMAL HIGH (ref 65–99)
Potassium: 4.2 mmol/L (ref 3.5–5.3)
SODIUM: 139 mmol/L (ref 135–146)

## 2016-07-19 LAB — LIPID PANEL
CHOL/HDL RATIO: 3.1 ratio (ref <5.0)
Cholesterol: 145 mg/dL (ref <200)
HDL: 47 mg/dL (ref >40)
LDL CALC: 87 mg/dL (ref <100)
TRIGLYCERIDES: 55 mg/dL (ref <150)
VLDL: 11 mg/dL (ref <30)

## 2016-07-19 NOTE — Patient Instructions (Signed)
Your physician recommends that you continue on your current medications as directed. Please refer to the Current Medication list given to you today.  Your physician recommends that you return for lab work in: today (BMET, liver function, lipids)  Your physician wants you to follow-up in: 6 months with Dr. Acie Fredrickson. You will receive a reminder letter in the mail two months in advance. If you don't receive a letter, please call our office to schedule the follow-up appointment.
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
## 2016-09-28 ENCOUNTER — Telehealth: Payer: Self-pay | Admitting: Cardiovascular Disease

## 2016-09-28 NOTE — Telephone Encounter (Signed)
Returned call to patient no answer.Lmtc.

## 2016-09-28 NOTE — Telephone Encounter (Signed)
New message        Pt c/o of Chest Pain: STAT if CP now or developed within 24 hours  1. Are you having CP right now? no 2. Are you experiencing any other symptoms (ex. SOB, nausea, vomiting, sweating)? no 3. How long have you been experiencing CP?  Chest "fullness" for 2 days 4. Is your CP continuous or coming and going?  Comes and goes  5. Have you taken Nitroglycerin?  No  Pt wanted to make an appt to see the doctor today or tomorrow.  He states that he is not having any "fullness" now but has had it off and on for 2 days.  He did not request a call back from the nurse, however, I am sending a message in case he should be triaged. ?

## 2016-09-29 ENCOUNTER — Encounter: Payer: Self-pay | Admitting: Cardiovascular Disease

## 2016-09-29 ENCOUNTER — Encounter (INDEPENDENT_AMBULATORY_CARE_PROVIDER_SITE_OTHER): Payer: Self-pay

## 2016-09-29 ENCOUNTER — Ambulatory Visit (INDEPENDENT_AMBULATORY_CARE_PROVIDER_SITE_OTHER): Payer: Medicare Other | Admitting: Cardiovascular Disease

## 2016-09-29 VITALS — BP 172/96 | HR 74 | Ht 69.0 in | Wt 157.8 lb

## 2016-09-29 DIAGNOSIS — R002 Palpitations: Secondary | ICD-10-CM | POA: Diagnosis not present

## 2016-09-29 DIAGNOSIS — I2510 Atherosclerotic heart disease of native coronary artery without angina pectoris: Secondary | ICD-10-CM

## 2016-09-29 MED ORDER — NITROGLYCERIN 0.4 MG SL SUBL: 0.4000 mg | SUBLINGUAL_TABLET | SUBLINGUAL | 6 refills | Status: AC | PRN

## 2016-09-29 MED ORDER — LISINOPRIL 10 MG PO TABS: 10.0000 mg | ORAL_TABLET | Freq: Every day | ORAL | 3 refills | Status: DC

## 2016-09-29 MED ORDER — CARVEDILOL 6.25 MG PO TABS: 6.2500 mg | ORAL_TABLET | Freq: Two times a day (BID) | ORAL | 3 refills | Status: DC

## 2016-09-29 NOTE — Patient Instructions (Signed)
Medication Instructions:  INCREASE Lisinopril (Prinivil) to 10 mg twice daily INCREASE Carvedilol (Coreg) to 6.25 mg twice daily   Labwork: TODAY - TSH, BMET  Your physician recommends that you return for lab work in: 6 months on the day of or a few days before your office visit with Dr. Acie Fredrickson.  You will need to FAST for this appointment - nothing to eat or drink after midnight the night before except water.   Testing/Procedures: None Ordered   Follow-Up: Your physician wants you to follow-up in: 6 months with Dr. Acie Fredrickson.  You will receive a reminder letter in the mail two months in advance. If you don't receive a letter, please call our office to schedule the follow-up appointment.   If you need a refill on your cardiac medications before your next appointment, please call your pharmacy.   Thank you for choosing [facility]! Christen Bame, RN [phone]
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
## 2016-09-30 LAB — BASIC METABOLIC PANEL
BUN / CREAT RATIO: 10 (ref 10–24)
BUN: 10 mg/dL (ref 8–27)
CO2: 23 mmol/L (ref 18–29)
CREATININE: 0.96 mg/dL (ref 0.76–1.27)
Calcium: 9.1 mg/dL (ref 8.6–10.2)
Chloride: 105 mmol/L (ref 96–106)
GFR, EST AFRICAN AMERICAN: 85 mL/min/{1.73_m2} (ref >59)
GFR, EST NON AFRICAN AMERICAN: 74 mL/min/{1.73_m2} (ref >59)
Glucose: 97 mg/dL (ref 65–99)
Potassium: 4.6 mmol/L (ref 3.5–5.2)
Sodium: 143 mmol/L (ref 134–144)

## 2016-09-30 LAB — TSH: TSH: 0.516 u[IU]/mL (ref 0.450–4.500)

## 2016-09-30 NOTE — Telephone Encounter (Signed)
Patient seen by Dr. Acie Fredrickson on 09/29/16

## 2016-12-19 ENCOUNTER — Other Ambulatory Visit: Payer: Self-pay | Admitting: Cardiovascular Disease

## 2016-12-24 IMAGING — CT CT HEAD W/O CM
3 of 4 series · 17 of 47 positions shown, 20 images · non-contrast
Comparison: 05/18/2016

CLINICAL DATA: Subdural hematoma.  Follow-up.

EXAM:
CT HEAD WITHOUT CONTRAST
TECHNIQUE: Contiguous axial images were obtained from the base of the skull
through the vertex without intravenous contrast.

[Series 201: head w/o, idose (1) · axial · non-contrast · 0.43mm/px · z∈[+97,+232]mm · 11 of 33 slices shown, 14 images]
[im 3/33  brain]
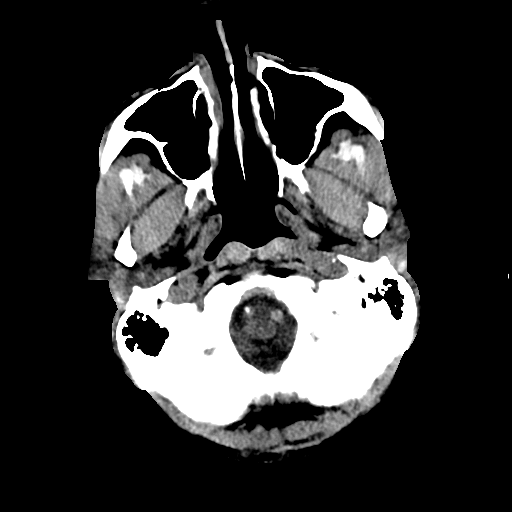
[im 3/33  bone]
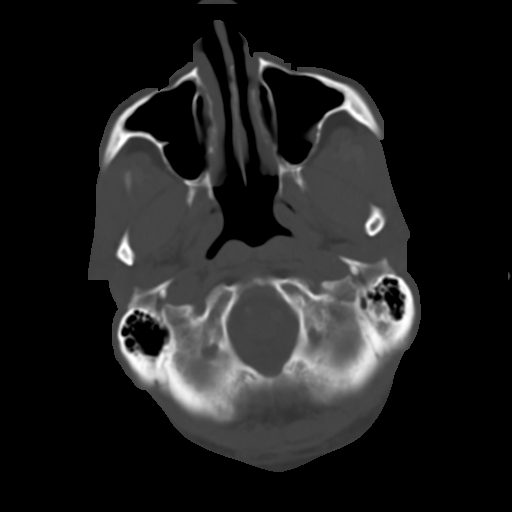
[im 5/33  brain]
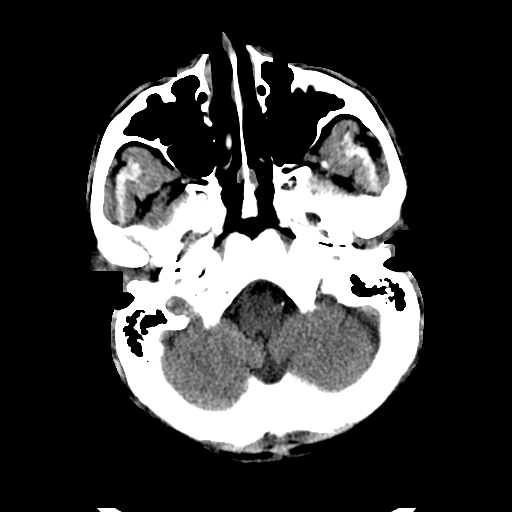
[im 7/33  brain]
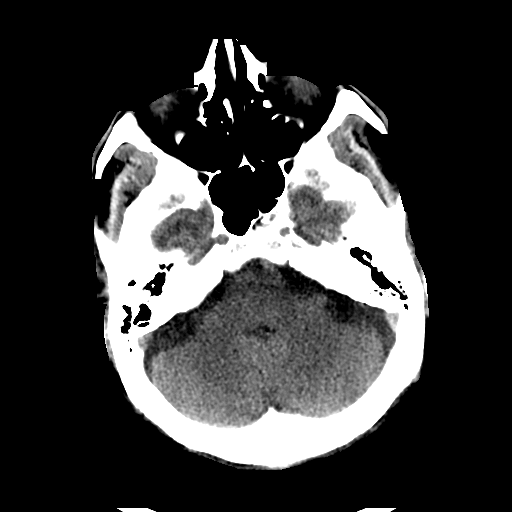
[im 12/33  brain]
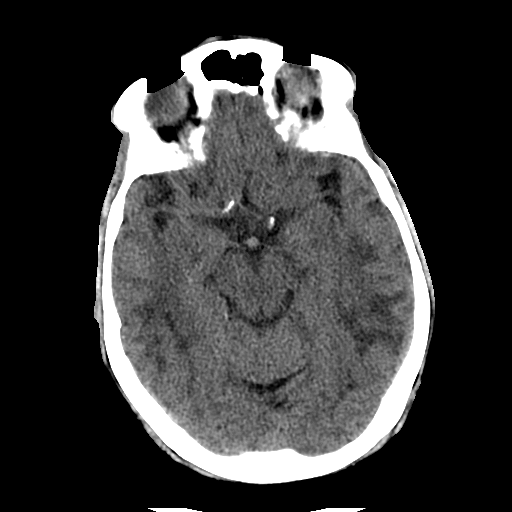
[im 14/33  brain]
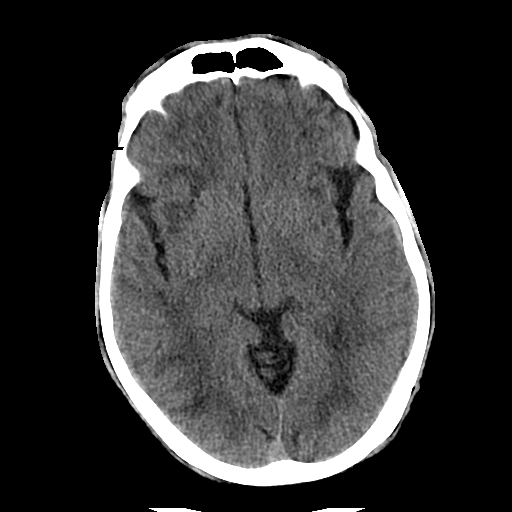
[im 14/33  bone]
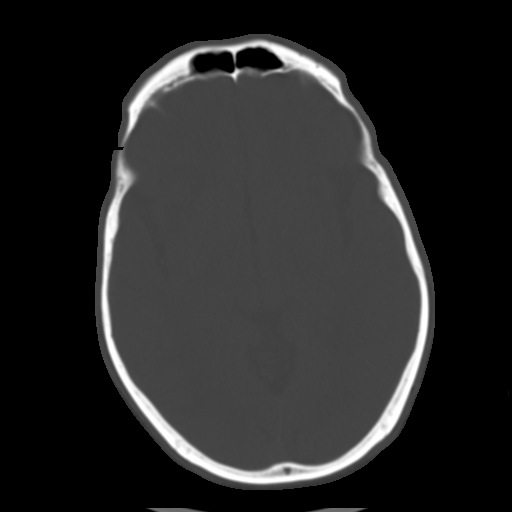
[im 17/33  brain]
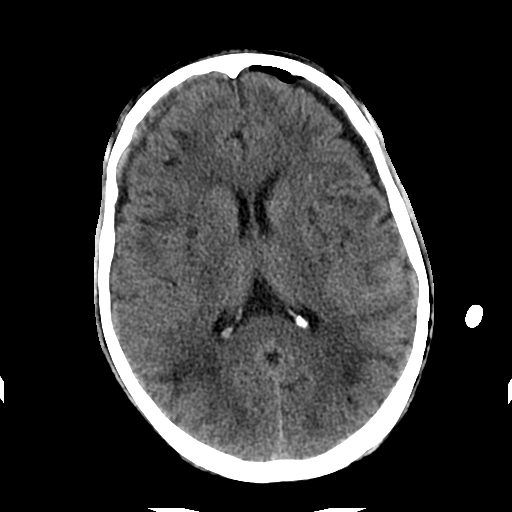
[im 19/33  brain]
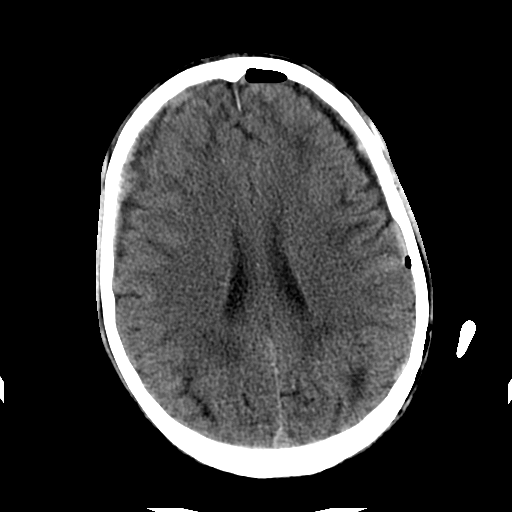
[im 21/33  brain]
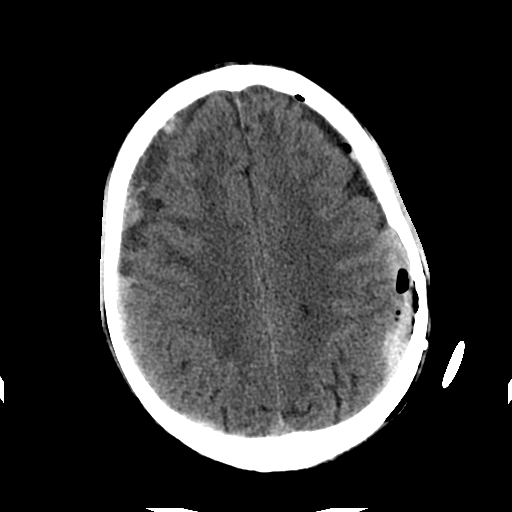
[im 26/33  brain]
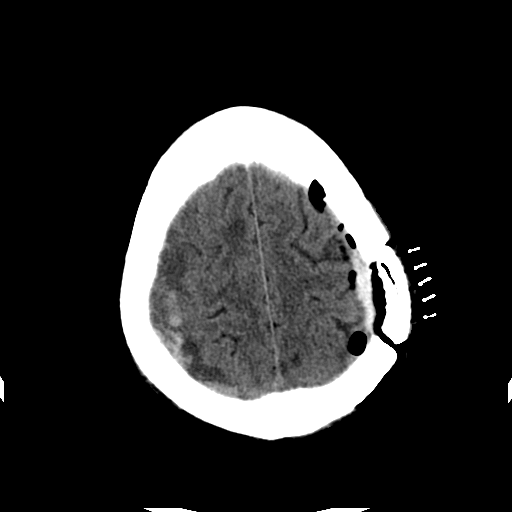
[im 26/33  bone]
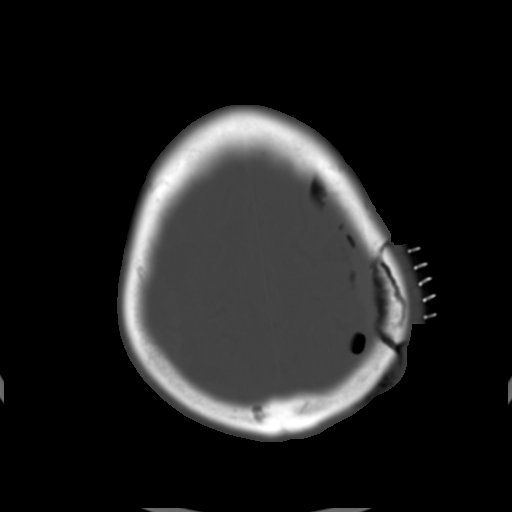
[im 28/33  brain]
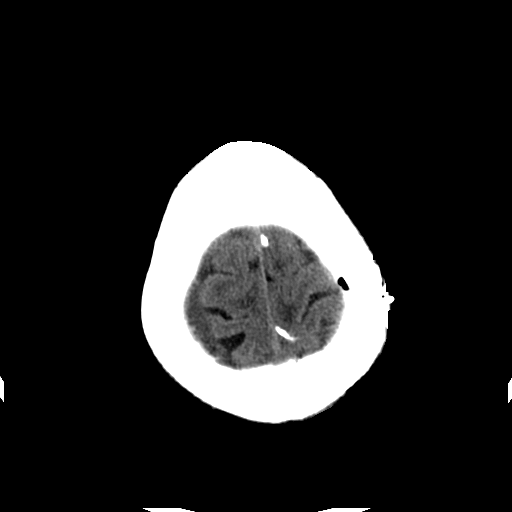
[im 30/33  brain]
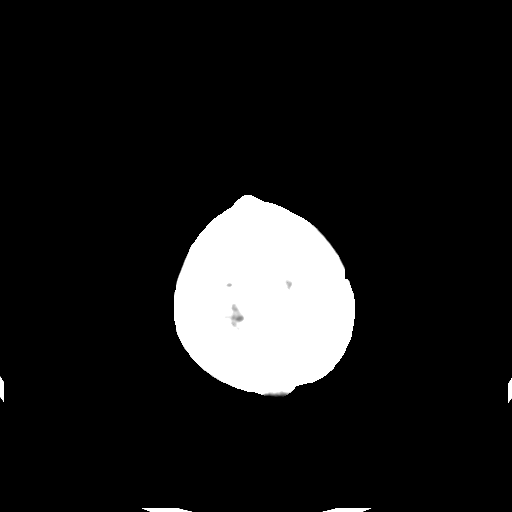

[Series 203: coronal st, idose (1) · coronal · 0.40mm/px · 3 of 73 slices shown]
[im 25/73  brain]
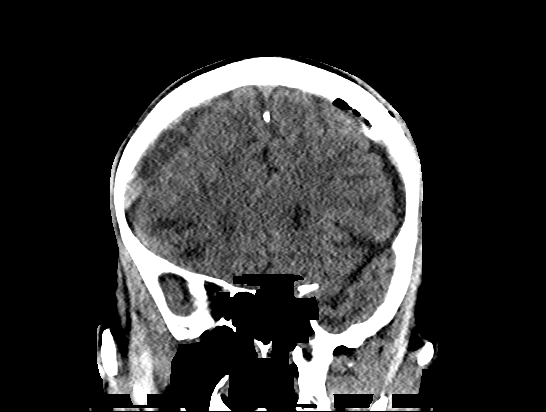
[im 33/73  brain]
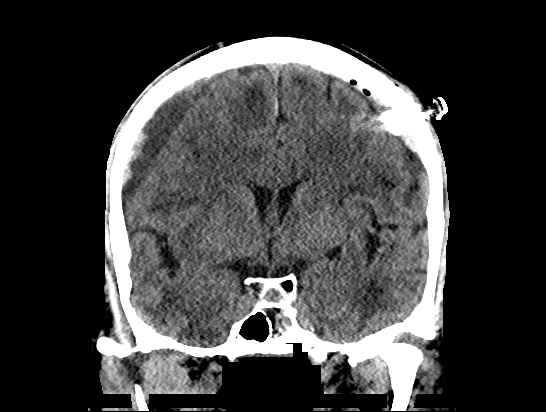
[im 41/73  brain]
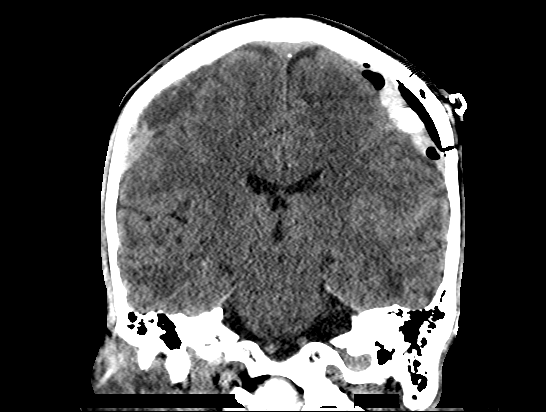

[Series 204: sagittal st, idose (1) · sagittal · 0.40mm/px · 3 of 73 slices shown]
[im 25/73  brain]
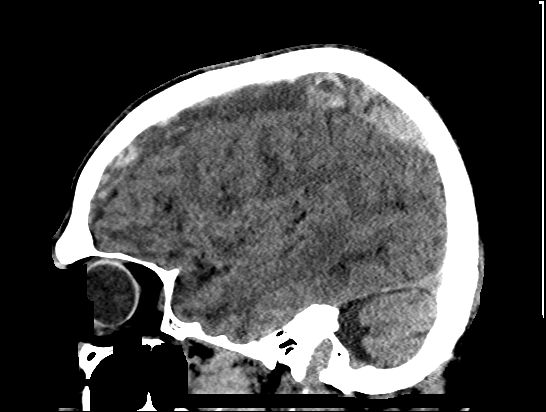
[im 37/73  brain]
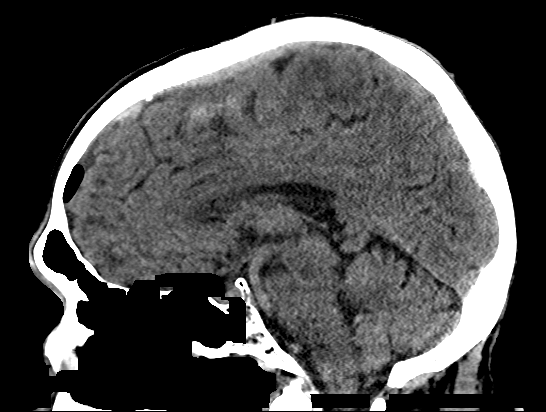
[im 49/73  brain]
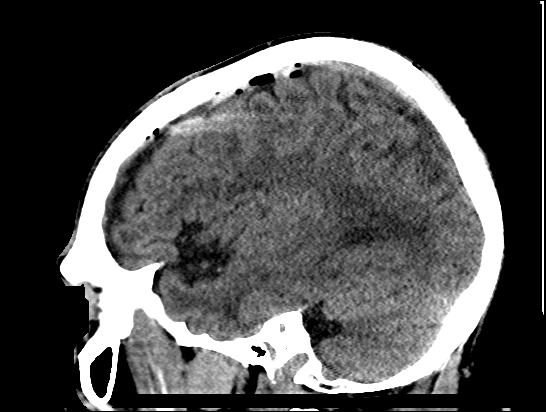

[17 of 47 positions shown; findings below may reference images not displayed]

FINDINGS: Brain: Interval left craniotomy with placement of subdural drain.
Subtotal evacuation of the subdural hematoma on the left, reduced in
thickness from 2.3 cm to only about 1 cm. Small amount of subdural
air as expected. Less mass effect upon the left hemisphere. Mixed
age subdural hematoma along the convexity on the right shows a
maximal thickness of 1.3 cm, very minimally larger than was seen 2
days ago. There has been some contraction of the more hyperdense
components but I do not think there is strong evidence of additional
active bleeding. No midline shift. No ventricular trapping. No
intraparenchymal bleeding. Chronic small-vessel ischemic change of
the white matter again demonstrated.

Vascular: There is atherosclerotic calcification of the major
vessels at the base of the brain.

Skull: Expected craniotomy changes.

Sinuses/Orbits: Clear/normal

Other: None
IMPRESSION: Left parietal craniotomy for subdural evacuation and placement of
subdural drain. Left convexity subdural hematoma is smaller, reduced
in maximal thickness from 2.3 cm to 1 cm. Less mass effect. No
midline shift.

No significant change in the right convexity mixed density subdural
hematoma. Maximal thickness is 1.3 cm compared 1.2 cm 2 days ago.
There has been some contraction of the blood products in areas but I
doubt there has been any new hyperdense bleeding.

## 2017-02-09 ENCOUNTER — Telehealth: Payer: Self-pay | Admitting: Cardiovascular Disease

## 2017-02-09 NOTE — Telephone Encounter (Signed)
Spoke with patient who called to clarify carvedilol dose. He states he is taking 6.25 mg twice daily and wanted to ensure that this is the correct dose. I advised that this is his correct dose since last ov with Dr. Acie Fredrickson on 09/29/16. He verbalized understanding and agreement and thanked me for the call.

## 2017-02-09 NOTE — Telephone Encounter (Signed)
New Message:   Please call,ptt says he forgot how he is supposed to be taking his Carvedilol.

## 2017-04-10 ENCOUNTER — Other Ambulatory Visit: Payer: Self-pay | Admitting: Cardiovascular Disease

## 2017-04-11 NOTE — Telephone Encounter (Signed)
atorvastatin (LIPITOR) 80 MG tablet  Medication  Date: 12/19/2016 Department: [address] Office Ordering/Authorizing: Nahser, Wonda Cheng, MD  Order Providers   Prescribing Provider Encounter Provider  Nahser, Wonda Cheng, MD Nahser, Wonda Cheng, MD  Medication Detail    Disp Refills Start End   atorvastatin (LIPITOR) 80 MG tablet 90 tablet 2 12/19/2016    Sig - Route: TAKE 1 TABLET (80 MG TOTAL) BY MOUTH DAILY. - Oral   Sent to pharmacy as: atorvastatin (LIPITOR) 80 MG tablet   E-Prescribing Status: Receipt confirmed by pharmacy (12/19/2016 4:05 PM EDT)   Pharmacy   [id number] [address]   Patient has refills on file until December 2018. Refill not appropriate at this time

## 2017-04-18 ENCOUNTER — Telehealth: Payer: Self-pay | Admitting: Cardiovascular Disease

## 2017-04-18 NOTE — Telephone Encounter (Signed)
Patient's daughter is requesting a copy of the medication profile and also would like to have patient signed up for mychart. Daughter would like to pick up the med list and the code to sign up for mychart. Message sent to medical records.

## 2017-04-18 NOTE — Telephone Encounter (Signed)
Called spoke with Daughter she is aware her Father (patient) will need to come in and sign release in order to get copy of his meds or to have them released to her. I have also attached phone number for My-Chart for them to call and get set up.

## 2017-04-18 NOTE — Telephone Encounter (Signed)
New message      Calling to get medication dosage info.  Daughter says she gives pt his medication, but pt does not think he is getting enough medication.

## 2017-05-03 ENCOUNTER — Encounter: Payer: Self-pay | Admitting: Cardiovascular Disease

## 2017-05-03 ENCOUNTER — Ambulatory Visit (INDEPENDENT_AMBULATORY_CARE_PROVIDER_SITE_OTHER): Payer: Medicare Other | Admitting: Cardiovascular Disease

## 2017-05-03 VITALS — BP 140/90 | HR 61 | Ht 69.0 in | Wt 148.6 lb

## 2017-05-03 DIAGNOSIS — E782 Mixed hyperlipidemia: Secondary | ICD-10-CM | POA: Diagnosis not present

## 2017-05-03 DIAGNOSIS — I2510 Atherosclerotic heart disease of native coronary artery without angina pectoris: Secondary | ICD-10-CM | POA: Diagnosis not present

## 2017-05-03 DIAGNOSIS — R42 Dizziness and giddiness: Secondary | ICD-10-CM | POA: Diagnosis not present

## 2017-05-03 NOTE — Patient Instructions (Addendum)
Medication Instructions:  Your physician recommends that you continue on your current medications as directed. Please refer to the Current Medication list given to you today.   Labwork: None Ordered   Testing/Procedures: Your physician has requested that you have a carotid duplex. This test is an ultrasound of the carotid arteries in your neck. It looks at blood flow through these arteries that supply the brain with blood. Allow one hour for this exam. There are no restrictions or special instructions.   Follow-Up: You have been referred to Neurology for evaluation of dizziness   Your physician recommends that you schedule a follow-up appointment in: 3 months with Dr. Acie Fredrickson   If you need a refill on your cardiac medications before your next appointment, please call your pharmacy.   Thank you for choosing [facility]! Christen Bame, RN [phone]
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
## 2017-05-11 ENCOUNTER — Other Ambulatory Visit: Payer: Self-pay | Admitting: Family Medicine

## 2017-05-11 DIAGNOSIS — R1032 Left lower quadrant pain: Secondary | ICD-10-CM

## 2017-05-15 ENCOUNTER — Ambulatory Visit
Admission: RE | Admit: 2017-05-15 | Discharge: 2017-05-15 | Disposition: A | Discharge status: Home / Self Care | Payer: Medicare Other | Source: Ambulatory Visit | Attending: Family Medicine | Admitting: Family Medicine

## 2017-05-15 DIAGNOSIS — R1032 Left lower quadrant pain: Secondary | ICD-10-CM

## 2017-05-15 MED ORDER — IOPAMIDOL (ISOVUE-300) INJECTION 61%
100.0000 mL | Freq: Once | INTRAVENOUS | Status: AC | PRN
  Administered 2017-05-15: 100 mL via INTRAVENOUS

## 2017-05-19 ENCOUNTER — Ambulatory Visit (HOSPITAL_COMMUNITY)
Admission: RE | Admit: 2017-05-19 | Discharge: 2017-05-19 | Disposition: A | Discharge status: Home / Self Care | Payer: Medicare Other | Source: Ambulatory Visit | Attending: Cardiovascular Disease | Admitting: Cardiovascular Disease

## 2017-05-19 DIAGNOSIS — R42 Dizziness and giddiness: Secondary | ICD-10-CM | POA: Insufficient documentation

## 2017-05-19 DIAGNOSIS — I2510 Atherosclerotic heart disease of native coronary artery without angina pectoris: Secondary | ICD-10-CM | POA: Insufficient documentation

## 2017-05-19 DIAGNOSIS — I6523 Occlusion and stenosis of bilateral carotid arteries: Secondary | ICD-10-CM | POA: Diagnosis not present

## 2017-05-23 ENCOUNTER — Telehealth: Payer: Self-pay | Admitting: Cardiovascular Disease

## 2017-05-23 NOTE — Telephone Encounter (Signed)
New message    Patient returned called for results. Please ca,,

## 2017-05-23 NOTE — Telephone Encounter (Signed)
Reviewed carotid results with patient who verbalized understanding. He thanked me for the call.

## 2017-05-23 NOTE — Telephone Encounter (Signed)
New message    Pt is calling asking for a call back from [place]. He said it's about an appt for a test. Please call.

## 2017-05-23 NOTE — Telephone Encounter (Signed)
Spoke with patient who states he received a call about scheduling a test and he was not aware of any tests that needed to be scheduled. I advised that there are no outstanding tests ordered by Dr. Acie Fredrickson. I asked if it could have been his PCP or neurologist that called. He states he will f/u with the neurologists' office because he saw them recently. He thanked me for the call.

## 2017-05-29 ENCOUNTER — Other Ambulatory Visit: Payer: Self-pay | Admitting: Family Medicine

## 2017-05-29 ENCOUNTER — Ambulatory Visit
Admission: RE | Admit: 2017-05-29 | Discharge: 2017-05-29 | Disposition: A | Discharge status: Home / Self Care | Payer: Medicare Other | Source: Ambulatory Visit | Attending: Family Medicine | Admitting: Family Medicine

## 2017-05-29 DIAGNOSIS — G8929 Other chronic pain: Secondary | ICD-10-CM

## 2017-05-29 DIAGNOSIS — M549 Dorsalgia, unspecified: Principal | ICD-10-CM

## 2017-06-20 ENCOUNTER — Emergency Department (HOSPITAL_COMMUNITY): Payer: Medicare Other

## 2017-06-20 ENCOUNTER — Encounter (HOSPITAL_COMMUNITY): Payer: Self-pay | Admitting: Emergency Medicine

## 2017-06-20 ENCOUNTER — Emergency Department (HOSPITAL_COMMUNITY)
Admission: EM | Admit: 2017-06-20 | Discharge: 2017-06-20 | Disposition: A | Discharge status: Home / Self Care | Payer: Medicare Other | Attending: Emergency Medicine | Admitting: Emergency Medicine

## 2017-06-20 DIAGNOSIS — R1010 Upper abdominal pain, unspecified: Secondary | ICD-10-CM | POA: Insufficient documentation

## 2017-06-20 DIAGNOSIS — I2510 Atherosclerotic heart disease of native coronary artery without angina pectoris: Secondary | ICD-10-CM | POA: Diagnosis not present

## 2017-06-20 DIAGNOSIS — I10 Essential (primary) hypertension: Secondary | ICD-10-CM | POA: Insufficient documentation

## 2017-06-20 DIAGNOSIS — Z79899 Other long term (current) drug therapy: Secondary | ICD-10-CM | POA: Insufficient documentation

## 2017-06-20 DIAGNOSIS — R109 Unspecified abdominal pain: Secondary | ICD-10-CM | POA: Diagnosis present

## 2017-06-20 DIAGNOSIS — Z87891 Personal history of nicotine dependence: Secondary | ICD-10-CM | POA: Insufficient documentation

## 2017-06-20 DIAGNOSIS — Z7982 Long term (current) use of aspirin: Secondary | ICD-10-CM | POA: Insufficient documentation

## 2017-06-20 LAB — COMPREHENSIVE METABOLIC PANEL
ALBUMIN: 4.1 g/dL (ref 3.5–5.0)
ALK PHOS: 94 U/L (ref 38–126)
ALT: 32 U/L (ref 17–63)
ANION GAP: 7 (ref 5–15)
AST: 37 U/L (ref 15–41)
BUN: 9 mg/dL (ref 6–20)
CHLORIDE: 102 mmol/L (ref 101–111)
CO2: 27 mmol/L (ref 22–32)
CREATININE: 0.86 mg/dL (ref 0.61–1.24)
Calcium: 9.8 mg/dL (ref 8.9–10.3)
GFR calc non Af Amer: >60 mL/min (ref >60)
GLUCOSE: 114 mg/dL — AB (ref 65–99)
POTASSIUM: 4.6 mmol/L (ref 3.5–5.1)
SODIUM: 136 mmol/L (ref 135–145)
Total Bilirubin: 0.9 mg/dL (ref 0.3–1.2)
Total Protein: 7.1 g/dL (ref 6.5–8.1)

## 2017-06-20 LAB — LIPASE, BLOOD: LIPASE: 44 U/L (ref 11–51)

## 2017-06-20 LAB — CBC
HCT: 41.8 % (ref 39.0–52.0)
Hemoglobin: 13.3 g/dL (ref 13.0–17.0)
MCH: 27.8 pg (ref 26.0–34.0)
MCHC: 31.8 g/dL (ref 30.0–36.0)
MCV: 87.3 fL (ref 78.0–100.0)
PLATELETS: 207 10*3/uL (ref 150–400)
RBC: 4.79 MIL/uL (ref 4.22–5.81)
RDW: 16.7 % — ABNORMAL HIGH (ref 11.5–15.5)
WBC: 4.3 10*3/uL (ref 4.0–10.5)

## 2017-06-20 LAB — URINALYSIS, ROUTINE W REFLEX MICROSCOPIC
Bilirubin Urine: NEGATIVE
GLUCOSE, UA: NEGATIVE mg/dL
HGB URINE DIPSTICK: NEGATIVE
Ketones, ur: NEGATIVE mg/dL
Leukocytes, UA: NEGATIVE
Nitrite: NEGATIVE
PROTEIN: NEGATIVE mg/dL
Specific Gravity, Urine: 1.02 (ref 1.005–1.030)
pH: 5 (ref 5.0–8.0)

## 2017-06-20 LAB — TROPONIN I: Troponin I: <0.03 ng/mL (ref <0.03)

## 2017-06-20 MED ORDER — GI COCKTAIL ~~LOC~~
30.0000 mL | Freq: Once | ORAL | Status: AC
  Administered 2017-06-20: 30 mL via ORAL
  Filled 2017-06-20: qty 30

## 2017-06-20 MED ORDER — FAMOTIDINE 20 MG PO TABS: 20.0000 mg | ORAL_TABLET | Freq: Two times a day (BID) | ORAL | 0 refills | Status: DC

## 2017-06-20 MED ORDER — PANTOPRAZOLE SODIUM 20 MG PO TBEC: 20.0000 mg | DELAYED_RELEASE_TABLET | Freq: Every day | ORAL | 0 refills | Status: DC

## 2017-06-20 MED ORDER — FAMOTIDINE 20 MG PO TABS
20.0000 mg | ORAL_TABLET | Freq: Once | ORAL | Status: AC
  Administered 2017-06-20: 20 mg via ORAL
  Filled 2017-06-20: qty 1

## 2017-06-20 NOTE — ED Triage Notes (Signed)
Pt arrives to ED from home for 1 month of generalized abd pain with decreased appetite. Pt reports having to take stool softens to have BM. Pt states he has a follow up with GI for tomorrow but due to pain he had in abd this am when waking up felt he needed to be seen today. Pt denies and vomiting or diarrhea.

## 2017-06-20 NOTE — ED Notes (Signed)
ED Provider at bedside. 

## 2017-06-20 NOTE — ED Notes (Signed)
Pt reports that he has constant abdominal pain every day. He reports taking tylenol on a regular basis for the pain. HE reports he was taking tramadol but he no longer takes r/t side effects. Wife reports pt was diaphoretic this morning. Pt states he has had constipation and decreased appetite.

## 2017-06-20 NOTE — ED Provider Notes (Signed)
[facility] EMERGENCY DEPARTMENT Provider Note   CSN: [id number] Arrival date & time: 06/20/17  0741     History   Chief Complaint Chief Complaint  Patient presents with  . Abdominal Pain    HPI Alexander Farley is a 83 y.o. male.  HPI  83 year old male presents with abdominal pain.  He has been having abdominal pain every day for at least 2 months.  It is above his umbilicus diffusely. On 9/17 he had a CT scan by his PCP for this very same abdominal pain.  He has a GI appointment set up for 2 days from now.  However at around 3 AM when he was coming back from the bathroom he had a severe worsening of the abdominal pain.  Made him break out into a sweat.  He has had worsening of his abdominal pain each day for at least an episode or 2.  However he has never broken out into a sweat.  Took some Tylenol and now his pain is better.  Last took tramadol last night.  There is no chest pain or shortness of breath.  It feels like a burning sensation in his abdomen.  It is there 24 hours a day and nothing seems to make these episodes come.  It is not food related.  He has been having decreased oral intake that he states is because he just does not feel like eating and is not hungry.  Sometimes gets nauseated when he thinks or looks at food but has not had any vomiting.  No diarrhea.  He has had less bowel movements but he thinks is from eating less.  He last had a bowel movement yesterday after taking a stool softener and it was soft and loose.  No blood.  No black stools. Pain radiates to his back.  Past Medical History:  Diagnosis Date  . Anxiety   . Back pain   . Carotid stenosis    Carotid US (3/15):  Bilateral ICA 1-39%  . Coronary artery disease    a.  hx stent to LAD;  b. LHC (2/11):  LAD with 70 and 80% ISR, D1 90%, CFX 30-40%, RCA 30-40% >>> CABG (L-LAD, S-OM)  . Dyslipidemia   . ED (erectile dysfunction)   . History of shingles    post herpetic neuralgia (L chest)  .  Hx of cardiovascular stress test 02/2014   Normal study with no ischemia.  LVF was normal with EF 66%.  Marland Kitchen Hx of echocardiogram     Echo (10/05):  EF 55-60%  . Hypertension   . Palpitations   . Paroxysmal atrial fibrillation (HCC)   . RBBB plus LA hemiblock     Patient Active Problem List   Diagnosis Date Noted  . Dizziness 05/03/2017  . Malnutrition of moderate degree 05/31/2016  . Syncope 05/30/2016  . Chest pain 05/30/2016  . S/P craniotomy 05/19/2016  . Subdural hematoma ([place]) 05/15/2016  . HLD (hyperlipidemia) 04/23/2014  . Shingles 04/23/2014  . Unstable angina, sounds anginal, neg. nuc, may be GI component  03/27/2014  . Generalized weakness 11/16/2013  . Near syncope 11/16/2013  . PAF (paroxysmal atrial fibrillation) ([place]) 11/16/2013  . HTN (hypertension) 05/03/2011  . CAD (coronary artery disease), S/p stenting of his ostial LAD- 2006.  CABG 2011   02/02/2011  . Dizziness - light-headed 02/01/2011  . Arm numbness 02/01/2011    Past Surgical History:  Procedure Laterality Date  . APPLICATION OF CRANIAL NAVIGATION N/A 05/19/2016  Procedure: APPLICATION OF CRANIAL NAVIGATION;  Surgeon: Eustace Moore, MD;  Location: [facility];  Service: Neurosurgery;  Laterality: N/A;  . CARDIAC CATHETERIZATION  10/12/2009   NORMAL LEFT VENTRICULAR SYSTOLIC FUNCTION. EF 60-65%  . CORONARY ANGIOPLASTY WITH STENT PLACEMENT    . CORONARY ARTERY BYPASS GRAFT     FEb. 2011  . CRANIOTOMY N/A 05/19/2016   Procedure: CRANIOTOMY HEMATOMA EVACUATION SUBDURAL with brain lab;  Surgeon: Eustace Moore, MD;  Location: [facility];  Service: Neurosurgery;  Laterality: N/A;  . EYE SURGERY  02-07-11   Right Eye       Home Medications    Prior to Admission medications   Medication Sig Start Date End Date Taking? Authorizing Provider  acetaminophen (TYLENOL) 325 MG tablet Take 2 tablets (650 mg total) by mouth every 4 (four) hours as needed for headache or mild pain. 06/01/16   Geradine Girt, DO    aspirin 81 MG tablet Take 81 mg by mouth daily.     [provider]  atorvastatin (LIPITOR) 80 MG tablet TAKE 1 TABLET (80 MG TOTAL) BY MOUTH DAILY. 12/19/16   Nahser, Wonda Cheng, MD  carvedilol (COREG) 6.25 MG tablet Take 1 tablet (6.25 mg total) by mouth 2 (two) times daily. 09/29/16   Nahser, Wonda Cheng, MD  Cyanocobalamin (B-12) 1000 MCG TBCR Take 500 mcg by mouth 2 (two) times daily.  08/15/14   Nahser, Wonda Cheng, MD  famotidine (PEPCID) 20 MG tablet Take 1 tablet (20 mg total) by mouth 2 (two) times daily. 06/20/17   Sherwood Gambler, MD  folic acid (FOLVITE) 1 MG tablet Take 1 mg by mouth daily.      [provider]  lisinopril (PRINIVIL,ZESTRIL) 10 MG tablet Take 1 tablet (10 mg total) by mouth daily. 09/29/16 12/28/16  Nahser, Wonda Cheng, MD  LORazepam (ATIVAN) 1 MG tablet Take 0.5-1 tablets by mouth 2 (two) times daily as needed for anxiety. 03/23/17   [provider]  Multiple Vitamin (MULTIVITAMIN) capsule Take 1 capsule by mouth daily.      [provider]  naproxen sodium (ANAPROX) 220 MG tablet Take 220 mg by mouth 2 (two) times daily as needed (pain).    [provider]  nitroGLYCERIN (NITROSTAT) 0.4 MG SL tablet Place 1 tablet (0.4 mg total) under the tongue every 5 (five) minutes as needed for chest pain. 09/29/16 12/28/16  Nahser, Wonda Cheng, MD  pantoprazole (PROTONIX) 20 MG tablet Take 1 tablet (20 mg total) by mouth daily. 06/20/17   Sherwood Gambler, MD  Polyethyl Glycol-Propyl Glycol (SYSTANE ULTRA) 0.4-0.3 % SOLN Apply 1 drop to eye daily as needed (irratation).    [provider]  potassium chloride (K-DUR,KLOR-CON) 10 MEQ tablet Take 1 tablet (10 mEq total) by mouth daily. 06/01/16   Geradine Girt, DO  Tamsulosin HCl (FLOMAX) 0.4 MG CAPS Take 0.4 mg by mouth daily.      [provider]  traMADol (ULTRAM) 50 MG tablet Take 1 tablet (50 mg total) by mouth every 6 (six) hours as needed for moderate pain. 06/01/16   Geradine Girt, DO     Family History Family History  Problem Relation Age of Onset  . Stroke Mother   . Stroke Father   . Heart attack Neg Hx     Social History Social History  Substance Use Topics  . Smoking status: Former Smoker    Quit date: 01/30/1985  . Smokeless tobacco: Never Used  . Alcohol use No  Allergies   Zocor [simvastatin]   Review of Systems Review of Systems  Constitutional: Negative for fever.  Respiratory: Negative for shortness of breath.   Cardiovascular: Negative for chest pain.  Gastrointestinal: Positive for abdominal pain and constipation. Negative for diarrhea and vomiting.  Genitourinary: Negative for dysuria.  Musculoskeletal: Positive for back pain.  All other systems reviewed and are negative.    Physical Exam Updated Vital Signs BP (!) 142/86   Pulse 64   Temp 97.7 F (36.5 C) (Oral)   Resp 20   SpO2 100%   Physical Exam  Constitutional: He is oriented to person, place, and time. He appears well-developed and well-nourished. No distress.  HENT:  Head: Normocephalic and atraumatic.  Right Ear: External ear normal.  Left Ear: External ear normal.  Nose: Nose normal.  Eyes: Right eye exhibits no discharge. Left eye exhibits no discharge.  Neck: Neck supple.  Cardiovascular: Normal rate, regular rhythm and normal heart sounds.   Pulmonary/Chest: Effort normal and breath sounds normal.  Abdominal: Soft. Normal appearance. There is no tenderness. There is no CVA tenderness.  No focal tenderness in abdomen  Genitourinary: Rectum normal. Rectal exam shows no external hemorrhoid, no internal hemorrhoid, no fissure, no mass, no tenderness and anal tone normal.  Genitourinary Comments: Light brown stool, no gross blood on DRE  Musculoskeletal: He exhibits no edema.  Neurological: He is alert and oriented to person, place, and time.  Skin: Skin is warm and dry. He is not diaphoretic.  Nursing note and vitals reviewed.    ED Treatments / Results   Labs (all labs ordered are listed, but only abnormal results are displayed) Labs Reviewed  COMPREHENSIVE METABOLIC PANEL - Abnormal; Notable for the following:       Result Value   Glucose, Bld 114 (*)    All other components within normal limits  CBC - Abnormal; Notable for the following:    RDW 16.7 (*)    All other components within normal limits  LIPASE, BLOOD  URINALYSIS, ROUTINE W REFLEX MICROSCOPIC  TROPONIN I    EKG  EKG Interpretation  Date/Time:  Tuesday June 20 2017 10:03:42 EDT Ventricular Rate:  65 PR Interval:    QRS Duration: 139 QT Interval:  444 QTC Calculation: 462 R Axis:   -74 Text Interpretation:  Sinus rhythm Atrial premature complex RBBB and LAFB similar to Oct 2017 Confirmed by Sherwood Gambler [phone]) on 06/20/2017 10:06:10 AM Also confirmed by Sherwood Gambler [phone])  on 06/20/2017 10:12:39 AM       Radiology US Abdomen Limited Ruq  Result Date: 06/20/2017 CLINICAL DATA:  Abdominal pain . EXAM: ULTRASOUND ABDOMEN LIMITED RIGHT UPPER QUADRANT COMPARISON:  CT 05/15/2017 . FINDINGS: Gallbladder: No gallstones or wall thickening visualized. No sonographic Murphy sign noted by sonographer. Common bile duct: Diameter: 5.1 mm Liver: Stable 2.0 cm simple cyst right hepatic lobe. Portal vein is patent on color Doppler imaging with normal direction of blood flow towards the liver. IMPRESSION: 1.  No acute abnormality identified. 2.  Stable simple cyst right hepatic lobe. Electronically Signed   By: Marcello Moores  Register   On: 06/20/2017 10:49   CT ABD/PELVIS W CONTRAST - 05/15/17  IMPRESSION: 1. No acute abdominal or pelvic pathology. 2. Lumbar spine spondylosis as described above. 3.  Aortic Atherosclerosis (ICD10-170.0) 4. Prostatic enlargement.   Electronically Signed   By: Kathreen Devoid   On: 05/15/2017 08:16  Procedures Procedures (including critical care time)  Medications Ordered in ED Medications  famotidine (PEPCID)  tablet 20 mg (20 mg  Oral Given 06/20/17 0958)  gi cocktail (Maalox,Lidocaine,Donnatal) (30 mLs Oral Given 06/20/17 0959)     Initial Impression / Assessment and Plan / ED Course  I have reviewed the triage vital signs and the nursing notes.  Pertinent labs & imaging results that were available during my care of the patient were reviewed by me and considered in my medical decision making (see chart for details).     Patient's pain is better after GI cocktail and Pepcid.  Given the burning sensation and upper abdominal pain with no other clear cause, this is likely gastritis.  He has not had any rectal bleeding or black stools.  No gross blood or melena on digital rectal exam.  Minimal to no tenderness on my exam now.  No signs of cholecystitis or cholelithiasis.  Given he had a CT chest about 1 month ago for the exact same symptoms, I do not think a repeat scan would be beneficial.  Doubt ACS, seems more likely to be GI but given age and location ECG and troponin obtained (negative). Continue to follow-up with gastroenterology in 2 days and start PPI and H2 blocker.  Return precautions.  Final Clinical Impressions(s) / ED Diagnoses   Final diagnoses:  Upper abdominal pain    New Prescriptions New Prescriptions   FAMOTIDINE (PEPCID) 20 MG TABLET    Take 1 tablet (20 mg total) by mouth 2 (two) times daily.   PANTOPRAZOLE (PROTONIX) 20 MG TABLET    Take 1 tablet (20 mg total) by mouth daily.     Sherwood Gambler, MD 06/20/17 [phone]

## 2017-06-20 NOTE — ED Notes (Signed)
Pt transported to US

## 2017-06-22 ENCOUNTER — Other Ambulatory Visit: Payer: Self-pay | Admitting: Gastroenterology

## 2017-06-22 ENCOUNTER — Ambulatory Visit
Admission: RE | Admit: 2017-06-22 | Discharge: 2017-06-22 | Disposition: A | Discharge status: Home / Self Care | Payer: Medicare Other | Source: Ambulatory Visit | Attending: Gastroenterology | Admitting: Gastroenterology

## 2017-06-22 DIAGNOSIS — R634 Abnormal weight loss: Secondary | ICD-10-CM

## 2017-06-22 MED ORDER — IOPAMIDOL (ISOVUE-300) INJECTION 61%
100.0000 mL | Freq: Once | INTRAVENOUS | Status: AC | PRN
  Administered 2017-06-22: 100 mL via INTRAVENOUS

## 2017-06-26 ENCOUNTER — Inpatient Hospital Stay (HOSPITAL_COMMUNITY)
Admission: EM | Admit: 2017-06-26 | Discharge: 2017-06-30 | DRG: 547 | Disposition: A | Discharge status: Home / Self Care | Payer: Medicare Other | Attending: Internal Medicine | Admitting: Internal Medicine

## 2017-06-26 ENCOUNTER — Encounter (HOSPITAL_COMMUNITY): Payer: Self-pay | Admitting: Emergency Medicine

## 2017-06-26 ENCOUNTER — Emergency Department (HOSPITAL_COMMUNITY): Payer: Medicare Other

## 2017-06-26 DIAGNOSIS — Z955 Presence of coronary angioplasty implant and graft: Secondary | ICD-10-CM

## 2017-06-26 DIAGNOSIS — R112 Nausea with vomiting, unspecified: Secondary | ICD-10-CM | POA: Diagnosis present

## 2017-06-26 DIAGNOSIS — R634 Abnormal weight loss: Secondary | ICD-10-CM | POA: Diagnosis present

## 2017-06-26 DIAGNOSIS — I708 Atherosclerosis of other arteries: Secondary | ICD-10-CM | POA: Diagnosis present

## 2017-06-26 DIAGNOSIS — Z7982 Long term (current) use of aspirin: Secondary | ICD-10-CM | POA: Diagnosis not present

## 2017-06-26 DIAGNOSIS — Z823 Family history of stroke: Secondary | ICD-10-CM

## 2017-06-26 DIAGNOSIS — M549 Dorsalgia, unspecified: Secondary | ICD-10-CM | POA: Diagnosis present

## 2017-06-26 DIAGNOSIS — I739 Peripheral vascular disease, unspecified: Secondary | ICD-10-CM | POA: Diagnosis present

## 2017-06-26 DIAGNOSIS — K449 Diaphragmatic hernia without obstruction or gangrene: Secondary | ICD-10-CM | POA: Diagnosis present

## 2017-06-26 DIAGNOSIS — E785 Hyperlipidemia, unspecified: Secondary | ICD-10-CM | POA: Diagnosis present

## 2017-06-26 DIAGNOSIS — Z6820 Body mass index (BMI) 20.0-20.9, adult: Secondary | ICD-10-CM

## 2017-06-26 DIAGNOSIS — I776 Arteritis, unspecified: Principal | ICD-10-CM | POA: Diagnosis present

## 2017-06-26 DIAGNOSIS — N400 Enlarged prostate without lower urinary tract symptoms: Secondary | ICD-10-CM | POA: Diagnosis present

## 2017-06-26 DIAGNOSIS — I2510 Atherosclerotic heart disease of native coronary artery without angina pectoris: Secondary | ICD-10-CM | POA: Diagnosis present

## 2017-06-26 DIAGNOSIS — K219 Gastro-esophageal reflux disease without esophagitis: Secondary | ICD-10-CM | POA: Diagnosis present

## 2017-06-26 DIAGNOSIS — Z79899 Other long term (current) drug therapy: Secondary | ICD-10-CM | POA: Diagnosis not present

## 2017-06-26 DIAGNOSIS — R1084 Generalized abdominal pain: Secondary | ICD-10-CM | POA: Diagnosis present

## 2017-06-26 DIAGNOSIS — Z87891 Personal history of nicotine dependence: Secondary | ICD-10-CM | POA: Diagnosis not present

## 2017-06-26 DIAGNOSIS — I10 Essential (primary) hypertension: Secondary | ICD-10-CM | POA: Diagnosis present

## 2017-06-26 DIAGNOSIS — Z951 Presence of aortocoronary bypass graft: Secondary | ICD-10-CM

## 2017-06-26 LAB — URINALYSIS, ROUTINE W REFLEX MICROSCOPIC
BILIRUBIN URINE: NEGATIVE
Glucose, UA: NEGATIVE mg/dL
Hgb urine dipstick: NEGATIVE
Ketones, ur: NEGATIVE mg/dL
LEUKOCYTES UA: NEGATIVE
NITRITE: NEGATIVE
Protein, ur: NEGATIVE mg/dL
SPECIFIC GRAVITY, URINE: 1.027 (ref 1.005–1.030)
pH: 5 (ref 5.0–8.0)

## 2017-06-26 LAB — CBC
HCT: 39.6 % (ref 39.0–52.0)
HEMOGLOBIN: 12.7 g/dL — AB (ref 13.0–17.0)
MCH: 27.9 pg (ref 26.0–34.0)
MCHC: 32.1 g/dL (ref 30.0–36.0)
MCV: 87 fL (ref 78.0–100.0)
Platelets: 204 10*3/uL (ref 150–400)
RBC: 4.55 MIL/uL (ref 4.22–5.81)
RDW: 17.2 % — ABNORMAL HIGH (ref 11.5–15.5)
WBC: 10.6 10*3/uL — ABNORMAL HIGH (ref 4.0–10.5)

## 2017-06-26 LAB — COMPREHENSIVE METABOLIC PANEL
ALK PHOS: 86 U/L (ref 38–126)
ALT: 32 U/L (ref 17–63)
ANION GAP: 9 (ref 5–15)
AST: 32 U/L (ref 15–41)
Albumin: 3.7 g/dL (ref 3.5–5.0)
BILIRUBIN TOTAL: 0.8 mg/dL (ref 0.3–1.2)
BUN: 15 mg/dL (ref 6–20)
CALCIUM: 9.2 mg/dL (ref 8.9–10.3)
CO2: 24 mmol/L (ref 22–32)
Chloride: 104 mmol/L (ref 101–111)
Creatinine, Ser: 0.86 mg/dL (ref 0.61–1.24)
GFR calc non Af Amer: >60 mL/min (ref >60)
GLUCOSE: 105 mg/dL — AB (ref 65–99)
POTASSIUM: 4 mmol/L (ref 3.5–5.1)
Sodium: 137 mmol/L (ref 135–145)
TOTAL PROTEIN: 6.5 g/dL (ref 6.5–8.1)

## 2017-06-26 LAB — I-STAT TROPONIN, ED: TROPONIN I, POC: 0 ng/mL (ref 0.00–0.08)

## 2017-06-26 LAB — I-STAT CG4 LACTIC ACID, ED: LACTIC ACID, VENOUS: 1.8 mmol/L (ref 0.5–1.9)

## 2017-06-26 LAB — LIPASE, BLOOD: Lipase: 42 U/L (ref 11–51)

## 2017-06-26 LAB — SEDIMENTATION RATE: Sed Rate: 4 mm/hr (ref 0–16)

## 2017-06-26 LAB — C-REACTIVE PROTEIN

## 2017-06-26 MED ORDER — ADULT MULTIVITAMIN W/MINERALS CH
ORAL_TABLET | Freq: Every day | ORAL | Status: DC
  Administered 2017-06-26 – 2017-06-30 (×5): 1 via ORAL
  Filled 2017-06-26 (×5): qty 1

## 2017-06-26 MED ORDER — SODIUM CHLORIDE 0.9 % IV SOLN
INTRAVENOUS | Status: DC
  Administered 2017-06-26 – 2017-06-29 (×4): via INTRAVENOUS

## 2017-06-26 MED ORDER — FAMOTIDINE 20 MG PO TABS
20.0000 mg | ORAL_TABLET | Freq: Two times a day (BID) | ORAL | Status: DC
  Administered 2017-06-27 – 2017-06-30 (×7): 20 mg via ORAL
  Filled 2017-06-26 (×7): qty 1

## 2017-06-26 MED ORDER — POLYVINYL ALCOHOL 1.4 % OP SOLN: 1.0000 [drp] | Freq: Every day | OPHTHALMIC | Status: DC | PRN

## 2017-06-26 MED ORDER — ONDANSETRON HCL 4 MG/2ML IJ SOLN: 4.0000 mg | Freq: Four times a day (QID) | INTRAMUSCULAR | Status: DC | PRN

## 2017-06-26 MED ORDER — TRAMADOL HCL 50 MG PO TABS
50.0000 mg | ORAL_TABLET | Freq: Four times a day (QID) | ORAL | Status: DC | PRN
  Administered 2017-06-27 – 2017-06-30 (×5): 50 mg via ORAL
  Filled 2017-06-26 (×6): qty 1

## 2017-06-26 MED ORDER — ACETAMINOPHEN 650 MG RE SUPP: 650.0000 mg | Freq: Four times a day (QID) | RECTAL | Status: DC | PRN

## 2017-06-26 MED ORDER — HYDROMORPHONE HCL 1 MG/ML IJ SOLN
0.5000 mg | Freq: Once | INTRAMUSCULAR | Status: AC
  Administered 2017-06-26: 0.5 mg via INTRAVENOUS
  Filled 2017-06-26: qty 1

## 2017-06-26 MED ORDER — LORAZEPAM 0.5 MG PO TABS: 0.5000 mg | ORAL_TABLET | Freq: Two times a day (BID) | ORAL | Status: DC | PRN

## 2017-06-26 MED ORDER — ENOXAPARIN SODIUM 40 MG/0.4ML ~~LOC~~ SOLN
40.0000 mg | SUBCUTANEOUS | Status: DC
  Administered 2017-06-26 – 2017-06-29 (×4): 40 mg via SUBCUTANEOUS
  Filled 2017-06-26 (×5): qty 0.4

## 2017-06-26 MED ORDER — PANTOPRAZOLE SODIUM 20 MG PO TBEC
20.0000 mg | DELAYED_RELEASE_TABLET | Freq: Every day | ORAL | Status: DC
  Administered 2017-06-27 – 2017-06-30 (×4): 20 mg via ORAL
  Filled 2017-06-26 (×5): qty 1

## 2017-06-26 MED ORDER — FOLIC ACID 1 MG PO TABS
1.0000 mg | ORAL_TABLET | Freq: Every day | ORAL | Status: DC
  Administered 2017-06-27 – 2017-06-30 (×4): 1 mg via ORAL
  Filled 2017-06-26 (×4): qty 1

## 2017-06-26 MED ORDER — LISINOPRIL 10 MG PO TABS
10.0000 mg | ORAL_TABLET | Freq: Every day | ORAL | Status: DC
  Administered 2017-06-27 – 2017-06-30 (×4): 10 mg via ORAL
  Filled 2017-06-26 (×4): qty 1

## 2017-06-26 MED ORDER — TAMSULOSIN HCL 0.4 MG PO CAPS
0.4000 mg | ORAL_CAPSULE | Freq: Every day | ORAL | Status: DC
  Administered 2017-06-27 – 2017-06-30 (×4): 0.4 mg via ORAL
  Filled 2017-06-26 (×4): qty 1

## 2017-06-26 MED ORDER — HYDROMORPHONE HCL 1 MG/ML IJ SOLN
0.5000 mg | INTRAMUSCULAR | Status: DC | PRN
  Administered 2017-06-27 – 2017-06-28 (×2): 0.5 mg via INTRAVENOUS
  Filled 2017-06-26 (×2): qty 1

## 2017-06-26 MED ORDER — ACETAMINOPHEN 325 MG PO TABS: 650.0000 mg | ORAL_TABLET | Freq: Four times a day (QID) | ORAL | Status: DC | PRN

## 2017-06-26 MED ORDER — SODIUM CHLORIDE 0.9 % IV BOLUS (SEPSIS)
500.0000 mL | Freq: Once | INTRAVENOUS | Status: AC
  Administered 2017-06-26: 500 mL via INTRAVENOUS

## 2017-06-26 MED ORDER — PREDNISONE 20 MG PO TABS: 20.0000 mg | ORAL_TABLET | Freq: Two times a day (BID) | ORAL | Status: DC

## 2017-06-26 MED ORDER — ONDANSETRON HCL 4 MG/2ML IJ SOLN
4.0000 mg | Freq: Once | INTRAMUSCULAR | Status: AC
  Administered 2017-06-26: 4 mg via INTRAVENOUS
  Filled 2017-06-26: qty 2

## 2017-06-26 MED ORDER — POTASSIUM CHLORIDE CRYS ER 10 MEQ PO TBCR
10.0000 meq | EXTENDED_RELEASE_TABLET | Freq: Every day | ORAL | Status: DC
  Administered 2017-06-27 – 2017-06-30 (×4): 10 meq via ORAL
  Filled 2017-06-26 (×4): qty 1

## 2017-06-26 MED ORDER — ATORVASTATIN CALCIUM 80 MG PO TABS
80.0000 mg | ORAL_TABLET | Freq: Every day | ORAL | Status: DC
  Administered 2017-06-27 – 2017-06-30 (×4): 80 mg via ORAL
  Filled 2017-06-26 (×4): qty 1

## 2017-06-26 MED ORDER — ONDANSETRON HCL 4 MG PO TABS: 4.0000 mg | ORAL_TABLET | Freq: Four times a day (QID) | ORAL | Status: DC | PRN

## 2017-06-26 MED ORDER — IOPAMIDOL (ISOVUE-370) INJECTION 76%
INTRAVENOUS | Status: AC
  Administered 2017-06-26: 100 mL
  Filled 2017-06-26: qty 100

## 2017-06-26 MED ORDER — CARVEDILOL 6.25 MG PO TABS
6.2500 mg | ORAL_TABLET | Freq: Two times a day (BID) | ORAL | Status: DC
  Administered 2017-06-26 – 2017-06-30 (×8): 6.25 mg via ORAL
  Filled 2017-06-26 (×8): qty 1

## 2017-06-26 MED ORDER — ACETAMINOPHEN 325 MG PO TABS: 650.0000 mg | ORAL_TABLET | ORAL | Status: DC | PRN

## 2017-06-26 MED ORDER — ASPIRIN 81 MG PO CHEW
81.0000 mg | CHEWABLE_TABLET | Freq: Every day | ORAL | Status: DC
  Administered 2017-06-27 – 2017-06-30 (×4): 81 mg via ORAL
  Filled 2017-06-26 (×4): qty 1

## 2017-06-26 NOTE — ED Notes (Signed)
Patient transported to CT 

## 2017-06-26 NOTE — ED Triage Notes (Signed)
Pt to ER for further evaluation of persistent generalized abd pain onset "months ago" with progressive worsening over the last two weeks. States loss of appetite, denies n/v/d. Wife states believes patient has lost weight in the last two weeks. NAD at present.  A/o x4.

## 2017-06-26 NOTE — ED Provider Notes (Signed)
[facility] EMERGENCY DEPARTMENT Provider Note   CSN: [id number] Arrival date & time: 06/26/17  1106     History   Chief Complaint Chief Complaint  Patient presents with  . Abdominal Pain    HPI Alexander Farley is a 83 y.o. male.  Patient with history of ACS status post stenting, presents with continued abdominal pain and weight loss over the past several weeks.  Patient has been seen in the emergency department and by his gastroenterologist.  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  He was started on Prednisone 20mg  daily by Dr. Michail Sermon, last dose taken this morning.  Patient states that he is unable to eat because this causes pain to be severe.  Pain is generalized over the entire abdomen with radiation shoulder.  No chest pain or shortness of breath. No vomiting or diarrhea. The onset of this condition was acute. The course is worsening. Aggravating factors: eating. Alleviating factors: none. Medications prescribed by GI are not helping to this point, states pain is actually worse today.       Past Medical History:  Diagnosis Date  . Anxiety   . Back pain   . Carotid stenosis    Carotid US (3/15):  Bilateral ICA 1-39%  . Coronary artery disease    a.  hx stent to LAD;  b. LHC (2/11):  LAD with 70 and 80% ISR, D1 90%, CFX 30-40%, RCA 30-40% >>> CABG (L-LAD, S-OM)  . Dyslipidemia   . ED (erectile dysfunction)   . History of shingles    post herpetic neuralgia (L chest)  . Hx of cardiovascular stress test 02/2014   Normal study with no ischemia.  LVF was normal with EF 66%.  Marland Kitchen Hx of echocardiogram     Echo (10/05):  EF 55-60%  . Hypertension   . Palpitations   . Paroxysmal atrial fibrillation (HCC)   . RBBB plus LA hemiblock     Patient Active Problem List   Diagnosis Date Noted  . Dizziness 05/03/2017  . Malnutrition of moderate degree 05/31/2016    . Syncope 05/30/2016  . Chest pain 05/30/2016  . S/P craniotomy 05/19/2016  . Subdural hematoma ([place]) 05/15/2016  . HLD (hyperlipidemia) 04/23/2014  . Shingles 04/23/2014  . Unstable angina, sounds anginal, neg. nuc, may be GI component  03/27/2014  . Generalized weakness 11/16/2013  . Near syncope 11/16/2013  . PAF (paroxysmal atrial fibrillation) ([place]) 11/16/2013  . HTN (hypertension) 05/03/2011  . CAD (coronary artery disease), S/p stenting of his ostial LAD- 2006.  CABG 2011   02/02/2011  . Dizziness - light-headed 02/01/2011  . Arm numbness 02/01/2011    Past Surgical History:  Procedure Laterality Date  . APPLICATION OF CRANIAL NAVIGATION N/A 05/19/2016   Procedure: APPLICATION OF CRANIAL NAVIGATION;  Surgeon: Eustace Moore, MD;  Location: [facility];  Service: Neurosurgery;  Laterality: N/A;  . CARDIAC CATHETERIZATION  10/12/2009   NORMAL LEFT VENTRICULAR SYSTOLIC FUNCTION. EF 60-65%  . CORONARY ANGIOPLASTY WITH STENT PLACEMENT    . CORONARY ARTERY BYPASS GRAFT     FEb. 2011  . CRANIOTOMY N/A 05/19/2016   Procedure: CRANIOTOMY HEMATOMA EVACUATION SUBDURAL with brain lab;  Surgeon: Eustace Moore, MD;  Location: [facility];  Service: Neurosurgery;  Laterality: N/A;  . EYE SURGERY  02-07-11   Right Eye       Home Medications  Prior to Admission medications   Medication Sig Start Date End Date Taking? Authorizing Provider  acetaminophen (TYLENOL) 325 MG tablet Take 2 tablets (650 mg total) by mouth every 4 (four) hours as needed for headache or mild pain. 06/01/16   Geradine Girt, DO  aspirin 81 MG tablet Take 81 mg by mouth daily.     [provider]  atorvastatin (LIPITOR) 80 MG tablet TAKE 1 TABLET (80 MG TOTAL) BY MOUTH DAILY. 12/19/16   Nahser, Wonda Cheng, MD  carvedilol (COREG) 6.25 MG tablet Take 1 tablet (6.25 mg total) by mouth 2 (two) times daily. 09/29/16   Nahser, Wonda Cheng, MD  Cyanocobalamin (B-12) 1000 MCG TBCR Take 500 mcg by mouth 2 (two) times  daily.  08/15/14   Nahser, Wonda Cheng, MD  famotidine (PEPCID) 20 MG tablet Take 1 tablet (20 mg total) by mouth 2 (two) times daily. 06/20/17   Sherwood Gambler, MD  folic acid (FOLVITE) 1 MG tablet Take 1 mg by mouth daily.      [provider]  lisinopril (PRINIVIL,ZESTRIL) 10 MG tablet Take 1 tablet (10 mg total) by mouth daily. 09/29/16 12/28/16  Nahser, Wonda Cheng, MD  LORazepam (ATIVAN) 1 MG tablet Take 0.5-1 tablets by mouth 2 (two) times daily as needed for anxiety. 03/23/17   [provider]  Multiple Vitamin (MULTIVITAMIN) capsule Take 1 capsule by mouth daily.      [provider]  naproxen sodium (ANAPROX) 220 MG tablet Take 220 mg by mouth 2 (two) times daily as needed (pain).    [provider]  nitroGLYCERIN (NITROSTAT) 0.4 MG SL tablet Place 1 tablet (0.4 mg total) under the tongue every 5 (five) minutes as needed for chest pain. 09/29/16 12/28/16  Nahser, Wonda Cheng, MD  pantoprazole (PROTONIX) 20 MG tablet Take 1 tablet (20 mg total) by mouth daily. 06/20/17   Sherwood Gambler, MD  Polyethyl Glycol-Propyl Glycol (SYSTANE ULTRA) 0.4-0.3 % SOLN Apply 1 drop to eye daily as needed (irratation).    [provider]  potassium chloride (K-DUR,KLOR-CON) 10 MEQ tablet Take 1 tablet (10 mEq total) by mouth daily. 06/01/16   Geradine Girt, DO  Tamsulosin HCl (FLOMAX) 0.4 MG CAPS Take 0.4 mg by mouth daily.      [provider]  traMADol (ULTRAM) 50 MG tablet Take 1 tablet (50 mg total) by mouth every 6 (six) hours as needed for moderate pain. 06/01/16   Geradine Girt, DO    Family History Family History  Problem Relation Age of Onset  . Stroke Mother   . Stroke Father   . Heart attack Neg Hx     Social History Social History  Substance Use Topics  . Smoking status: Former Smoker    Quit date: 01/30/1985  . Smokeless tobacco: Never Used  . Alcohol use No     Allergies   Zocor [simvastatin]   Review of Systems Review of Systems    Constitutional: Negative for fever.  HENT: Negative for rhinorrhea and sore throat.   Eyes: Negative for redness.  Respiratory: Negative for cough.   Cardiovascular: Negative for chest pain.  Gastrointestinal: Positive for abdominal pain and nausea. Negative for blood in stool, diarrhea and vomiting.  Genitourinary: Negative for dysuria.  Musculoskeletal: Positive for back pain. Negative for myalgias.  Skin: Negative for rash.  Neurological: Negative for headaches.     Physical Exam Updated Vital Signs BP 126/77 (BP Location: Right Arm)   Pulse 61   Temp 97.7  F (36.5 C) (Oral)   Resp 18   SpO2 100%   Physical Exam  Constitutional: He appears well-developed and well-nourished.  HENT:  Head: Normocephalic and atraumatic.  Mouth/Throat: Oropharynx is clear and moist.  Eyes: Conjunctivae are normal. Right eye exhibits no discharge. Left eye exhibits no discharge.  Neck: Normal range of motion. Neck supple.  Cardiovascular: Normal rate, regular rhythm and normal heart sounds.   Pulmonary/Chest: Effort normal and breath sounds normal. No respiratory distress. He has no wheezes. He has no rales.  Abdominal: Soft. There is tenderness (moderate tenderness, generalized). There is no rebound and no guarding.  Neurological: He is alert.  Skin: Skin is warm and dry.  Psychiatric: He has a normal mood and affect.  Nursing note and vitals reviewed.    ED Treatments / Results  Labs (all labs ordered are listed, but only abnormal results are displayed) Labs Reviewed  COMPREHENSIVE METABOLIC PANEL - Abnormal; Notable for the following:       Result Value   Glucose, Bld 105 (*)    All other components within normal limits  CBC - Abnormal; Notable for the following:    WBC 10.6 (*)    Hemoglobin 12.7 (*)    RDW 17.2 (*)    All other components within normal limits  URINALYSIS, ROUTINE W REFLEX MICROSCOPIC - Abnormal; Notable for the following:    Color, Urine AMBER (*)     APPearance HAZY (*)    All other components within normal limits  LIPASE, BLOOD  ANTINUCLEAR ANTIBODIES, IFA  ANCA TITERS  C3 COMPLEMENT  C4 COMPLEMENT  CRYOGLOBULIN  SEDIMENTATION RATE  C-REACTIVE PROTEIN  BASIC METABOLIC PANEL  I-STAT CG4 LACTIC ACID, ED  I-STAT TROPONIN, ED    EKG  EKG Interpretation  Date/Time:  Monday June 26 2017 17:13:47 EDT Ventricular Rate:  65 PR Interval:    QRS Duration: 147 QT Interval:  477 QTC Calculation: 496 R Axis:   -73 Text Interpretation:  Sinus rhythm Atrial premature complex Sinus pause RBBB and LAFB similar to prevoius EKG  Confirmed by Brantley Stage [phone]) on 06/26/2017 6:23:12 PM       Radiology Ct Angio Abd/pel W And/or Wo Contrast  Result Date: 06/26/2017 CLINICAL DATA:  83 year old male with abdominal pain and loss of appetite. Concern for mesenteric ischemia. EXAM: CTA ABDOMEN AND PELVIS wITHOUT AND WITH CONTRAST TECHNIQUE: Multidetector CT imaging of the abdomen and pelvis was performed using the standard protocol during bolus administration of intravenous contrast. Multiplanar reconstructed images and MIPs were obtained and reviewed to evaluate the vascular anatomy. CONTRAST:  100 cc Isovue 370 COMPARISON:  Abdominal CT dated 06/22/2017 FINDINGS: VASCULAR Aorta: There is moderate calcified and noncalcified plaque along the course of the aorta. The aorta is mildly tortuous and ectatic measuring up to 2.5 cm in diameter. There is noncalcified mural plaque in the periphery of the distal abdominal aorta without significant luminal narrowing. There is no aortic dissection. Celiac: The celiac axis is patent. There is ill-defined circumferential thickening of the origin of the celiac axis as seen on the prior CT likely representing inflammatory changes and vasculitis. SMA: Ill-defined circumferential soft tissue density involving the SMA likely representing inflammatory changes and vasculitis. The SMA remains patent however demonstrates a  slightly irregular and beaded appearance. There is a replaced right hepatic artery arising from the SMA. Renals: The renal arteries are patent. No aneurysmal dilatation or evidence of dissection. IMA: There is atherosclerotic calcification of the origin of the IMA. The IMA  however remains patent. Inflow: There is advanced atherosclerotic calcification of the iliac arteries primarily involving the internal iliacs. 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 The common iliac arteries , and external iliac arteries are widely patent without aneurysmal dilatation or dissection. The internal iliac arteries also remain patent despite advanced atherosclerotic disease. Proximal Outflow: Bilateral common femoral and visualized portions of the superficial and profunda femoral arteries are patent without evidence of aneurysm, dissection, vasculitis or significant stenosis. Veins: No obvious venous abnormality within the limitations of this arterial phase study. Review of the MIP images confirms the above findings. NON-VASCULAR Lower chest: The visualized lung bases are clear. There is a 2 cm pneumatocele at the right lung base. Stable 8 x 5 mm nodularity in the posterior basal segment of the right lower lobe (series 13 image 19). Follow-up as recommended on the CT of 06/22/2017. There is mild cardiomegaly. There is no intra-abdominal free air. Small free fluid noted within the pelvis. Hepatobiliary: Multiple scattered hypodense lesions within the liver most consistent with cysts. The gallbladder is predominantly contracted and appears grossly unremarkable. Pancreas: Unremarkable. No pancreatic ductal dilatation or surrounding inflammatory changes. Spleen: Normal in size without focal abnormality. Adrenals/Urinary Tract: The adrenal glands are unremarkable. There is a 4.5 cm right renal inferior pole cyst as well as scattered subcentimeter hypodensities which are  too small to characterize. There is no hydronephrosis on either side. The urinary bladder is grossly unremarkable. Stomach/Bowel: There is sigmoid diverticulosis. There is mild thickened appearance of the sigmoid colon, likely related to underdistention. Colitis is less likely but not entirely excluded. Clinical correlation is recommended. Mildly thickened appearance of the wall of the stomach also likely secondary to underdistention. Correlation with clinical exam is recommended to exclude gastritis. The there is no bowel obstruction. No bowel pneumatosis. The appendix is unremarkable. Lymphatic: No adenopathy. Reproductive: The prostate gland is enlarged and heterogeneous measuring approximately 6 cm in transverse diameter. Other: There is a right inguinal hernia containing small amount of fluid. The right testicle appears to be somewhat high-riding and within the right inguinal canal. Correlation with clinical exam recommended. Musculoskeletal: There is osteopenia with scoliosis and degenerative changes of the spine. No acute osseous pathology. IMPRESSION: VASCULAR 1. No CT evidence of mesenteric ischemia as clinically concerned. No pneumatosis or portal venous gas. 2. Moderate atherosclerotic disease of the aorta with calcified and noncalcified plaque. The aorta is ectatic measuring up to 2.5 cm in diameter. No aneurysmal dilatation or evidence of dissection. 3. Circumferential soft tissue density surrounding the origins of the celiac trunk and SMA as seen on the prior CT most likely representing inflammatory changes and vasculitis. The origins of the celiac axis, SMA, and IMA however remain patent. 4. Sigmoid diverticulosis without definite evidence of acute diverticulitis. Thickened appearance of the sigmoid colon may be related to underdistention. Correlation with clinical exam is recommended to exclude colitis. 5.  Aortic Atherosclerosis (ICD10-I70.0). 6. Other nonemergent findings including right posterior  basal subpleural nodule similar to prior CT, enlarged prostate gland, liver cysts, scoliosis and degenerative changes of the spine, and renal cysts. NON-VASCULAR Electronically Signed   By: Anner Crete M.D.   On: 06/26/2017 18:54    Procedures Procedures (including critical care time)  Medications Ordered in ED Medications  sodium chloride 0.9 % bolus 500 mL (not administered)  HYDROmorphone (DILAUDID) injection 0.5 mg (not administered)  ondansetron (ZOFRAN) injection 4 mg (not administered)  Initial Impression / Assessment and Plan / ED Course  I have reviewed the triage vital signs and the nursing notes.  Pertinent labs & imaging results that were available during my care of the patient were reviewed by me and considered in my medical decision making (see chart for details).     Patient seen and examined. Previous work-up reviewed. Work-up initiated. Will add CP labs given history.   Vital signs reviewed and are as follows: BP 126/77 (BP Location: Right Arm)   Pulse 61   Temp 97.7 F (36.5 C) (Oral)   Resp 18   SpO2 100%   Discussed case with Dr. Janeece Fitting.  States that CT angiography would be able to show if there were any worsening or bowel ischemia and be the least invasive choice with for further workup at this time. This was ordered.   Patient discussed with and seen by Dr. Oleta Mouse.   7:38 PM Patient updated on results. Pain well controlled at this time.   Spoke with [facility] who will round on patient in the morning.  Patient discussed with Dr. Alcario Drought who will see and admit.  Final Clinical Impressions(s) / ED Diagnoses   Final diagnoses:  Vasculitis of mesenteric artery (HCC)  Generalized abdominal pain   Admit.   New Prescriptions New Prescriptions   No medications on file     Carlisle Cater, Hershal Coria 06/26/17 1939    Forde Dandy, MD 06/26/17 2027

## 2017-06-26 NOTE — H&P (Signed)
History and Physical    TALBERT TREMBATH DJM:[id number] DOB: [birth year] DOA: 06/26/2017  PCP: [name], MD  Patient coming from: Home  I have personally briefly reviewed patient's old medical records in [place]  Chief Complaint: Abd pain  HPI: Alexander Farley is a 83 y.o. male with medical history significant of CAD.  Patient presents to the ED with c/o ongoing abd pain over past several weeks.  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  Started on prednisone '20mg'$  BID by Dr. Michail Sermon.  Hasnt helped.  Pain located throughout abdomen, no CP, no SOB.   ED Course: CT repeat again shows mesenteric vasculitis.  GI consulted and hospitalist asked to admit.   Review of Systems: As per HPI otherwise 10 point review of systems negative.   Past Medical History:  Diagnosis Date  . Anxiety   . Back pain   . Carotid stenosis    Carotid US (3/15):  Bilateral ICA 1-39%  . Coronary artery disease    a.  hx stent to LAD;  b. LHC (2/11):  LAD with 70 and 80% ISR, D1 90%, CFX 30-40%, RCA 30-40% >>> CABG (L-LAD, S-OM)  . Dyslipidemia   . ED (erectile dysfunction)   . History of shingles    post herpetic neuralgia (L chest)  . Hx of cardiovascular stress test 02/2014   Normal study with no ischemia.  LVF was normal with EF 66%.  Marland Kitchen Hx of echocardiogram     Echo (10/05):  EF 55-60%  . Hypertension   . Palpitations   . Paroxysmal atrial fibrillation (HCC)   . RBBB plus LA hemiblock     Past Surgical History:  Procedure Laterality Date  . APPLICATION OF CRANIAL NAVIGATION N/A 05/19/2016   Procedure: APPLICATION OF CRANIAL NAVIGATION;  Surgeon: Eustace Moore, MD;  Location: [place];  Service: Neurosurgery;  Laterality: N/A;  . CARDIAC CATHETERIZATION  10/12/2009   NORMAL LEFT VENTRICULAR SYSTOLIC FUNCTION. EF 60-65%  . CORONARY ANGIOPLASTY WITH STENT PLACEMENT    . CORONARY ARTERY BYPASS GRAFT     FEb. 2011  . CRANIOTOMY N/A 05/19/2016   Procedure:  CRANIOTOMY HEMATOMA EVACUATION SUBDURAL with brain lab;  Surgeon: Eustace Moore, MD;  Location: [place] [facility];  Service: Neurosurgery;  Laterality: N/A;  . EYE SURGERY  02-07-11   Right Eye     reports that he quit smoking about 32 years ago. He has never used smokeless tobacco. He reports that he does not drink alcohol or use drugs.  Allergies  Allergen Reactions  . Zocor [Simvastatin] Other (See Comments)    HYPOTENSION    Family History  Problem Relation Age of Onset  . Stroke Mother   . Stroke Father   . Heart attack Neg Hx      Prior to Admission medications   Medication Sig Start Date End Date Taking? Authorizing Provider  acetaminophen (TYLENOL) 325 MG tablet Take 2 tablets (650 mg total) by mouth every 4 (four) hours as needed for headache or mild pain. 06/01/16  Yes Eulogio Bear U, DO  aspirin 81 MG tablet Take 81 mg by mouth daily.    Yes [provider]  atorvastatin (LIPITOR) 80 MG tablet TAKE 1 TABLET (80 MG TOTAL) BY MOUTH DAILY. 12/19/16  Yes Nahser, Wonda Cheng, MD  carvedilol (COREG) 6.25 MG tablet Take 1 tablet (6.25 mg total) by mouth 2 (two) times daily. 09/29/16  Yes Nahser, Wonda Cheng, MD  Cyanocobalamin (  B-12) 1000 MCG TBCR Take 500 mcg by mouth 2 (two) times daily.  08/15/14  Yes Nahser, Deloris Ping, MD  famotidine (PEPCID) 20 MG tablet Take 1 tablet (20 mg total) by mouth 2 (two) times daily. 06/20/17  Yes Pricilla Loveless, MD  folic acid (FOLVITE) 1 MG tablet Take 1 mg by mouth daily.     Yes [provider]  lisinopril (PRINIVIL,ZESTRIL) 10 MG tablet Take 1 tablet (10 mg total) by mouth daily. 09/29/16 06/26/17 Yes Nahser, Deloris Ping, MD  LORazepam (ATIVAN) 1 MG tablet Take 0.5-1 tablets by mouth 2 (two) times daily as needed for anxiety. 03/23/17  Yes [provider]  Multiple Vitamin (MULTIVITAMIN) capsule Take 1 capsule by mouth daily.     Yes [provider]  naproxen sodium (ANAPROX) 220 MG tablet Take 220 mg by mouth 2 (two) times  daily as needed (pain).   Yes [provider]  nitroGLYCERIN (NITROSTAT) 0.4 MG SL tablet Place 1 tablet (0.4 mg total) under the tongue every 5 (five) minutes as needed for chest pain. 09/29/16 06/26/17 Yes Nahser, Deloris Ping, MD  pantoprazole (PROTONIX) 20 MG tablet Take 1 tablet (20 mg total) by mouth daily. 06/20/17  Yes Pricilla Loveless, MD  Polyethyl Glycol-Propyl Glycol (SYSTANE ULTRA) 0.4-0.3 % SOLN Apply 1 drop to eye daily as needed (irratation).   Yes [provider]  potassium chloride (K-DUR,KLOR-CON) 10 MEQ tablet Take 1 tablet (10 mEq total) by mouth daily. 06/01/16  Yes Vann, Jessica U, DO  predniSONE (DELTASONE) 20 MG tablet Take 20 mg by mouth 2 (two) times daily. 06/23/17  Yes [provider]  Tamsulosin HCl (FLOMAX) 0.4 MG CAPS Take 0.4 mg by mouth daily.     Yes [provider]  traMADol (ULTRAM) 50 MG tablet Take 1 tablet (50 mg total) by mouth every 6 (six) hours as needed for moderate pain. 06/01/16  Yes Joseph Art, DO    Physical Exam: Vitals:   06/26/17 1645 06/26/17 1745 06/26/17 1853 06/26/17 1858  BP: (!) 171/104  139/90   Pulse: 61 (!) 59 65   Resp:  14 (!) 24   Temp:    98.5 F (36.9 C)  TempSrc:    Oral  SpO2: 100% 98% 100%     Constitutional: NAD, calm, comfortable Eyes: PERRL, lids and conjunctivae normal ENMT: Mucous membranes are moist. Posterior pharynx clear of any exudate or lesions.Normal dentition.  Neck: normal, supple, no masses, no thyromegaly Respiratory: clear to auscultation bilaterally, no wheezing, no crackles. Normal respiratory effort. No accessory muscle use.  Cardiovascular: Regular rate and rhythm, no murmurs / rubs / gallops. No extremity edema. 2+ pedal pulses. No carotid bruits.  Abdomen: no tenderness, no masses palpated. No hepatosplenomegaly. Bowel sounds positive.  Musculoskeletal: no clubbing / cyanosis. No joint deformity upper and lower extremities. Good ROM, no contractures. Normal muscle  tone.  Skin: no rashes, lesions, ulcers. No induration Neurologic: CN 2-12 grossly intact. Sensation intact, DTR normal. Strength 5/5 in all 4.  Psychiatric: Normal judgment and insight. Alert and oriented x 3. Normal mood.    Labs on Admission: I have personally reviewed following labs and imaging studies  CBC:  Recent Labs Lab 06/20/17 0753 06/26/17 1148  WBC 4.3 10.6*  HGB 13.3 12.7*  HCT 41.8 39.6  MCV 87.3 87.0  PLT 207 204   Basic Metabolic Panel:  Recent Labs Lab 06/20/17 0753 06/26/17 1148  NA 136 137  K 4.6 4.0  CL 102 104  CO2  27 24  GLUCOSE 114* 105*  BUN 9 15  CREATININE 0.86 0.86  CALCIUM 9.8 9.2   GFR: CrCl cannot be calculated (Unknown ideal weight.). Liver Function Tests:  Recent Labs Lab 06/20/17 0753 06/26/17 1148  AST 37 32  ALT 32 32  ALKPHOS 94 86  BILITOT 0.9 0.8  PROT 7.1 6.5  ALBUMIN 4.1 3.7    Recent Labs Lab 06/20/17 0753 06/26/17 1148  LIPASE 44 42   No results for input(s): AMMONIA in the last 168 hours. Coagulation Profile: No results for input(s): INR, PROTIME in the last 168 hours. Cardiac Enzymes:  Recent Labs Lab 06/20/17 0947  TROPONINI <0.03   BNP (last 3 results) No results for input(s): PROBNP in the last 8760 hours. HbA1C: No results for input(s): HGBA1C in the last 72 hours. CBG: No results for input(s): GLUCAP in the last 168 hours. Lipid Profile: No results for input(s): CHOL, HDL, LDLCALC, TRIG, CHOLHDL, LDLDIRECT in the last 72 hours. Thyroid Function Tests: No results for input(s): TSH, T4TOTAL, FREET4, T3FREE, THYROIDAB in the last 72 hours. Anemia Panel: No results for input(s): VITAMINB12, FOLATE, FERRITIN, TIBC, IRON, RETICCTPCT in the last 72 hours. Urine analysis:    Component Value Date/Time   COLORURINE AMBER (A) 06/26/2017 1150   APPEARANCEUR HAZY (A) 06/26/2017 1150   LABSPEC 1.027 06/26/2017 1150   PHURINE 5.0 06/26/2017 1150   GLUCOSEU NEGATIVE 06/26/2017 1150   HGBUR  NEGATIVE 06/26/2017 1150   BILIRUBINUR NEGATIVE 06/26/2017 1150   KETONESUR NEGATIVE 06/26/2017 1150   PROTEINUR NEGATIVE 06/26/2017 1150   UROBILINOGEN 1.0 11/16/2013 0405   NITRITE NEGATIVE 06/26/2017 1150   LEUKOCYTESUR NEGATIVE 06/26/2017 1150    Radiological Exams on Admission: Ct Angio Abd/pel W And/or Wo Contrast  Result Date: 06/26/2017 CLINICAL DATA:  83 year old male with abdominal pain and loss of appetite. Concern for mesenteric ischemia. EXAM: CTA ABDOMEN AND PELVIS wITHOUT AND WITH CONTRAST TECHNIQUE: Multidetector CT imaging of the abdomen and pelvis was performed using the standard protocol during bolus administration of intravenous contrast. Multiplanar reconstructed images and MIPs were obtained and reviewed to evaluate the vascular anatomy. CONTRAST:  100 cc Isovue 370 COMPARISON:  Abdominal CT dated 06/22/2017 FINDINGS: VASCULAR Aorta: There is moderate calcified and noncalcified plaque along the course of the aorta. The aorta is mildly tortuous and ectatic measuring up to 2.5 cm in diameter. There is noncalcified mural plaque in the periphery of the distal abdominal aorta without significant luminal narrowing. There is no aortic dissection. Celiac: The celiac axis is patent. There is ill-defined circumferential thickening of the origin of the celiac axis as seen on the prior CT likely representing inflammatory changes and vasculitis. SMA: Ill-defined circumferential soft tissue density involving the SMA likely representing inflammatory changes and vasculitis. The SMA remains patent however demonstrates a slightly irregular and beaded appearance. There is a replaced right hepatic artery arising from the SMA. Renals: The renal arteries are patent. No aneurysmal dilatation or evidence of dissection. IMA: There is atherosclerotic calcification of the origin of the IMA. The IMA however remains patent. Inflow: There is advanced atherosclerotic calcification of the iliac arteries primarily  involving the internal iliacs. 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 The common iliac arteries , and external iliac arteries are widely patent without aneurysmal dilatation or dissection. The internal iliac arteries also remain patent despite advanced atherosclerotic disease. Proximal Outflow: Bilateral common femoral and visualized portions of the superficial and profunda  femoral arteries are patent without evidence of aneurysm, dissection, vasculitis or significant stenosis. Veins: No obvious venous abnormality within the limitations of this arterial phase study. Review of the MIP images confirms the above findings. NON-VASCULAR Lower chest: The visualized lung bases are clear. There is a 2 cm pneumatocele at the right lung base. Stable 8 x 5 mm nodularity in the posterior basal segment of the right lower lobe (series 13 image 19). Follow-up as recommended on the CT of 06/22/2017. There is mild cardiomegaly. There is no intra-abdominal free air. Small free fluid noted within the pelvis. Hepatobiliary: Multiple scattered hypodense lesions within the liver most consistent with cysts. The gallbladder is predominantly contracted and appears grossly unremarkable. Pancreas: Unremarkable. No pancreatic ductal dilatation or surrounding inflammatory changes. Spleen: Normal in size without focal abnormality. Adrenals/Urinary Tract: The adrenal glands are unremarkable. There is a 4.5 cm right renal inferior pole cyst as well as scattered subcentimeter hypodensities which are too small to characterize. There is no hydronephrosis on either side. The urinary bladder is grossly unremarkable. Stomach/Bowel: There is sigmoid diverticulosis. There is mild thickened appearance of the sigmoid colon, likely related to underdistention. Colitis is less likely but not entirely excluded. Clinical correlation is recommended. Mildly thickened appearance of the wall of  the stomach also likely secondary to underdistention. Correlation with clinical exam is recommended to exclude gastritis. The there is no bowel obstruction. No bowel pneumatosis. The appendix is unremarkable. Lymphatic: No adenopathy. Reproductive: The prostate gland is enlarged and heterogeneous measuring approximately 6 cm in transverse diameter. Other: There is a right inguinal hernia containing small amount of fluid. The right testicle appears to be somewhat high-riding and within the right inguinal canal. Correlation with clinical exam recommended. Musculoskeletal: There is osteopenia with scoliosis and degenerative changes of the spine. No acute osseous pathology. IMPRESSION: VASCULAR 1. No CT evidence of mesenteric ischemia as clinically concerned. No pneumatosis or portal venous gas. 2. Moderate atherosclerotic disease of the aorta with calcified and noncalcified plaque. The aorta is ectatic measuring up to 2.5 cm in diameter. No aneurysmal dilatation or evidence of dissection. 3. Circumferential soft tissue density surrounding the origins of the celiac trunk and SMA as seen on the prior CT most likely representing inflammatory changes and vasculitis. The origins of the celiac axis, SMA, and IMA however remain patent. 4. Sigmoid diverticulosis without definite evidence of acute diverticulitis. Thickened appearance of the sigmoid colon may be related to underdistention. Correlation with clinical exam is recommended to exclude colitis. 5.  Aortic Atherosclerosis (ICD10-I70.0). 6. Other nonemergent findings including right posterior basal subpleural nodule similar to prior CT, enlarged prostate gland, liver cysts, scoliosis and degenerative changes of the spine, and renal cysts. NON-VASCULAR Electronically Signed   By: Elgie Collard M.D.   On: 06/26/2017 18:54    EKG: Independently reviewed.  Assessment/Plan Principal Problem:   Vasculitis of mesenteric artery (HCC) Active Problems:   CAD (coronary  artery disease), S/p stenting of his ostial LAD- 2006.  CABG 2011     HTN (hypertension)    1. Vasculitis of mesenteric artery - 1. GI to see in AM 2. ANCA 3. ANA 4. C3, C4 5. Cryoglobulin 6. ESR, CRP 2. HTN - continue home meds 3. CAD - continue ASA, statin  DVT prophylaxis: Lovenox Code Status: Full Family Communication: Family at bedside Disposition Plan: Home after admit Consults called: [facility] Admission status: Admit to inpatient - failed outpatient treatment and work up   [employer], Heywood Iles. DO [facility] Pager  [phone]  If 7AM-7PM, please contact day team taking care of patient [email or web address] Password [email or web address]  06/26/2017, 8:05 PM

## 2017-06-27 LAB — BASIC METABOLIC PANEL
Anion gap: 5 (ref 5–15)
BUN: 13 mg/dL (ref 6–20)
CO2: 28 mmol/L (ref 22–32)
Calcium: 8.7 mg/dL — ABNORMAL LOW (ref 8.9–10.3)
Chloride: 105 mmol/L (ref 101–111)
Creatinine, Ser: 0.83 mg/dL (ref 0.61–1.24)
Glucose, Bld: 90 mg/dL (ref 65–99)
POTASSIUM: 4 mmol/L (ref 3.5–5.1)
SODIUM: 138 mmol/L (ref 135–145)

## 2017-06-27 MED ORDER — BOOST / RESOURCE BREEZE PO LIQD
1.0000 | Freq: Three times a day (TID) | ORAL | Status: DC
  Administered 2017-06-27 – 2017-06-30 (×8): 1 via ORAL

## 2017-06-27 MED ORDER — METHYLPREDNISOLONE SODIUM SUCC 125 MG IJ SOLR
60.0000 mg | Freq: Two times a day (BID) | INTRAMUSCULAR | Status: DC
  Administered 2017-06-27 – 2017-06-29 (×5): 60 mg via INTRAVENOUS
  Filled 2017-06-27 (×5): qty 2

## 2017-06-27 NOTE — ED Notes (Signed)
Admitting MD in w pt

## 2017-06-27 NOTE — Consult Note (Signed)
Referring Provider: Dr. Loleta Books Primary Care Physician:  [name], MD Primary Gastroenterologist:  Dr. Michail Sermon  Reason for Consultation:  Mesenteric Vasculitis; Abdominal pain  HPI: Alexander Farley is a 83 y.o. 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 His pain worsened this past Saturday with worsened abdominal pain that was 10/10 in intensity and then developed N/V and was unable to drink or eat all weekend. He decided to wait until Monday to call the office instead of go to the ER as previously instructed and was advised to go to the ER on Monday. Also continues to have back pain at times with the constant abdominal pain. Denies F/C/rectal bleeding. CT angiogram shows mesenteric vasculitis without mesenteric ischemia. Tolerating clear liquid diet in ER.  Past Medical History:  Diagnosis Date  . Anxiety   . Back pain   . Carotid stenosis    Carotid US (3/15):  Bilateral ICA 1-39%  . Coronary artery disease    a.  hx stent to LAD;  b. LHC (2/11):  LAD with 70 and 80% ISR, D1 90%, CFX 30-40%, RCA 30-40% >>> CABG (L-LAD, S-OM)  . Dyslipidemia   . ED (erectile dysfunction)   . History of shingles    post herpetic neuralgia (L chest)  . Hx of cardiovascular stress test 02/2014   Normal study with no ischemia.  LVF was normal with EF 66%.  Marland Kitchen Hx of echocardiogram     Echo (10/05):  EF 55-60%  . Hypertension   . Palpitations   . Paroxysmal atrial fibrillation (HCC)   . RBBB plus LA hemiblock     Past Surgical History:  Procedure Laterality Date  . APPLICATION OF CRANIAL NAVIGATION N/A 05/19/2016   Procedure: APPLICATION OF CRANIAL NAVIGATION;  Surgeon: Eustace Moore, MD;  Location: [facility];  Service: Neurosurgery;  Laterality: N/A;  . CARDIAC CATHETERIZATION  10/12/2009   NORMAL LEFT VENTRICULAR SYSTOLIC FUNCTION. EF 60-65%  . CORONARY ANGIOPLASTY WITH STENT  PLACEMENT    . CORONARY ARTERY BYPASS GRAFT     FEb. 2011  . CRANIOTOMY N/A 05/19/2016   Procedure: CRANIOTOMY HEMATOMA EVACUATION SUBDURAL with brain lab;  Surgeon: Eustace Moore, MD;  Location: [facility];  Service: Neurosurgery;  Laterality: N/A;  . EYE SURGERY  02-07-11   Right Eye    Prior to Admission medications   Medication Sig Start Date End Date Taking? Authorizing Provider  acetaminophen (TYLENOL) 325 MG tablet Take 2 tablets (650 mg total) by mouth every 4 (four) hours as needed for headache or mild pain. 06/01/16  Yes Eulogio Bear U, DO  aspirin 81 MG tablet Take 81 mg by mouth daily.    Yes [provider]  atorvastatin (LIPITOR) 80 MG tablet TAKE 1 TABLET (80 MG TOTAL) BY MOUTH DAILY. 12/19/16  Yes Nahser, Wonda Cheng, MD  carvedilol (COREG) 6.25 MG tablet Take 1 tablet (6.25 mg total) by mouth 2 (two) times daily. 09/29/16  Yes Nahser, Wonda Cheng, MD  Cyanocobalamin (B-12) 1000 MCG TBCR Take 500 mcg by mouth 2 (two) times daily.  08/15/14  Yes Nahser, Wonda Cheng, MD  famotidine (PEPCID) 20 MG tablet Take 1 tablet (20 mg total) by mouth 2 (two) times daily. 06/20/17  Yes Sherwood Gambler, MD  folic acid (FOLVITE) 1 MG tablet Take 1 mg by mouth daily.     Yes [provider]  lisinopril (  PRINIVIL,ZESTRIL) 10 MG tablet Take 1 tablet (10 mg total) by mouth daily. 09/29/16 06/26/17 Yes Nahser, Wonda Cheng, MD  LORazepam (ATIVAN) 1 MG tablet Take 0.5-1 tablets by mouth 2 (two) times daily as needed for anxiety. 03/23/17  Yes [provider]  Multiple Vitamin (MULTIVITAMIN) capsule Take 1 capsule by mouth daily.     Yes [provider]  naproxen sodium (ANAPROX) 220 MG tablet Take 220 mg by mouth 2 (two) times daily as needed (pain).   Yes [provider]  nitroGLYCERIN (NITROSTAT) 0.4 MG SL tablet Place 1 tablet (0.4 mg total) under the tongue every 5 (five) minutes as needed for chest pain. 09/29/16 06/26/17 Yes Nahser, Wonda Cheng, MD  pantoprazole (PROTONIX)  20 MG tablet Take 1 tablet (20 mg total) by mouth daily. 06/20/17  Yes Sherwood Gambler, MD  Polyethyl Glycol-Propyl Glycol (SYSTANE ULTRA) 0.4-0.3 % SOLN Apply 1 drop to eye daily as needed (irratation).   Yes [provider]  potassium chloride (K-DUR,KLOR-CON) 10 MEQ tablet Take 1 tablet (10 mEq total) by mouth daily. 06/01/16  Yes Vann, Jessica U, DO  predniSONE (DELTASONE) 20 MG tablet Take 20 mg by mouth 2 (two) times daily. 06/23/17  Yes [provider]  Tamsulosin HCl (FLOMAX) 0.4 MG CAPS Take 0.4 mg by mouth daily.     Yes [provider]  traMADol (ULTRAM) 50 MG tablet Take 1 tablet (50 mg total) by mouth every 6 (six) hours as needed for moderate pain. 06/01/16  Yes Eulogio Bear U, DO    Scheduled Meds: . aspirin  81 mg Oral Daily  . atorvastatin  80 mg Oral Daily  . carvedilol  6.25 mg Oral BID  . enoxaparin (LOVENOX) injection  40 mg Subcutaneous Q24H  . famotidine  20 mg Oral BID  . folic acid  1 mg Oral Daily  . lisinopril  10 mg Oral Daily  . multivitamin with minerals   Oral Daily  . pantoprazole  20 mg Oral Daily  . potassium chloride  10 mEq Oral Daily  . predniSONE  20 mg Oral BID  . tamsulosin  0.4 mg Oral Daily   Continuous Infusions: . sodium chloride 75 mL/hr at 06/26/17 2051   PRN Meds:.acetaminophen **OR** acetaminophen, HYDROmorphone (DILAUDID) injection, LORazepam, ondansetron **OR** ondansetron (ZOFRAN) IV, polyvinyl alcohol, traMADol  Allergies as of 06/26/2017 - Review Complete 06/26/2017  Allergen Reaction Noted  . Zocor [simvastatin] Other (See Comments) 01/31/2011    Family History  Problem Relation Age of Onset  . Stroke Mother   . Stroke Father   . Heart attack Neg Hx     Social History   Social History  . Marital status: Married    Spouse name: N/A  . Number of children: N/A  . Years of education: N/A   Occupational History  . Not on file.   Social History Main Topics  . Smoking status: Former Smoker     Quit date: 01/30/1985  . Smokeless tobacco: Never Used  . Alcohol use No  . Drug use: No  . Sexual activity: No   Other Topics Concern  . Not on file   Social History Narrative  . No narrative on file    Review of Systems: All negative except as stated above in HPI.  Physical Exam: Vital signs: Vitals:   06/27/17 0200 06/27/17 0505  BP: 125/90 123/76  Pulse: 66 62  Resp: (!) 21 16  Temp:  98.1 F (36.7 C)  SpO2: 99% 97%  General:  Lethargic, elderly, thin, uncomfortable Head: normocephalic, atraumatic Eyes: anicteric sclera ENT: oropharynx clear Neck: supple, nontender Lungs:  Clear throughout to auscultation.   No wheezes, crackles, or rhonchi. No acute distress. Heart:  Regular rate and rhythm; no murmurs, clicks, rubs,  or gallops. Abdomen: diffuse tenderness with guarding, soft, nondistended, +BS  Rectal:  Deferred Ext: no edema  GI:  Lab Results:  Recent Labs  06/26/17 1148  WBC 10.6*  HGB 12.7*  HCT 39.6  PLT 204   BMET  Recent Labs  06/26/17 1148 06/27/17 0445  NA 137 138  K 4.0 4.0  CL 104 105  CO2 24 28  GLUCOSE 105* 90  BUN 15 13  CREATININE 0.86 0.83  CALCIUM 9.2 8.7*   LFT  Recent Labs  06/26/17 1148  PROT 6.5  ALBUMIN 3.7  AST 32  ALT 32  ALKPHOS 86  BILITOT 0.8   PT/INR No results for input(s): LABPROT, INR in the last 72 hours.   Studies/Results: Ct Angio Abd/pel W And/or Wo Contrast  Result Date: 06/26/2017 CLINICAL DATA:  83 year old male with abdominal pain and loss of appetite. Concern for mesenteric ischemia. EXAM: CTA ABDOMEN AND PELVIS wITHOUT AND WITH CONTRAST TECHNIQUE: Multidetector CT imaging of the abdomen and pelvis was performed using the standard protocol during bolus administration of intravenous contrast. Multiplanar reconstructed images and MIPs were obtained and reviewed to evaluate the vascular anatomy. CONTRAST:  100 cc Isovue 370 COMPARISON:  Abdominal CT dated 06/22/2017 FINDINGS: VASCULAR  Aorta: There is moderate calcified and noncalcified plaque along the course of the aorta. The aorta is mildly tortuous and ectatic measuring up to 2.5 cm in diameter. There is noncalcified mural plaque in the periphery of the distal abdominal aorta without significant luminal narrowing. There is no aortic dissection. Celiac: The celiac axis is patent. There is ill-defined circumferential thickening of the origin of the celiac axis as seen on the prior CT likely representing inflammatory changes and vasculitis. SMA: Ill-defined circumferential soft tissue density involving the SMA likely representing inflammatory changes and vasculitis. The SMA remains patent however demonstrates a slightly irregular and beaded appearance. There is a replaced right hepatic artery arising from the SMA. Renals: The renal arteries are patent. No aneurysmal dilatation or evidence of dissection. IMA: There is atherosclerotic calcification of the origin of the IMA. The IMA however remains patent. Inflow: There is advanced atherosclerotic calcification of the iliac arteries primarily involving the internal iliacs. 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 The common iliac arteries , and external iliac arteries are widely patent without aneurysmal dilatation or dissection. The internal iliac arteries also remain patent despite advanced atherosclerotic disease. Proximal Outflow: Bilateral common femoral and visualized portions of the superficial and profunda femoral arteries are patent without evidence of aneurysm, dissection, vasculitis or significant stenosis. Veins: No obvious venous abnormality within the limitations of this arterial phase study. Review of the MIP images confirms the above findings. NON-VASCULAR Lower chest: The visualized lung bases are clear. There is a 2 cm pneumatocele at the right lung base. Stable 8 x 5 mm nodularity in the posterior basal segment of  the right lower lobe (series 13 image 19). Follow-up as recommended on the CT of 06/22/2017. There is mild cardiomegaly. There is no intra-abdominal free air. Small free fluid noted within the pelvis. Hepatobiliary: Multiple scattered hypodense lesions within the liver most consistent with cysts. The gallbladder is predominantly contracted and appears  grossly unremarkable. Pancreas: Unremarkable. No pancreatic ductal dilatation or surrounding inflammatory changes. Spleen: Normal in size without focal abnormality. Adrenals/Urinary Tract: The adrenal glands are unremarkable. There is a 4.5 cm right renal inferior pole cyst as well as scattered subcentimeter hypodensities which are too small to characterize. There is no hydronephrosis on either side. The urinary bladder is grossly unremarkable. Stomach/Bowel: There is sigmoid diverticulosis. There is mild thickened appearance of the sigmoid colon, likely related to underdistention. Colitis is less likely but not entirely excluded. Clinical correlation is recommended. Mildly thickened appearance of the wall of the stomach also likely secondary to underdistention. Correlation with clinical exam is recommended to exclude gastritis. The there is no bowel obstruction. No bowel pneumatosis. The appendix is unremarkable. Lymphatic: No adenopathy. Reproductive: The prostate gland is enlarged and heterogeneous measuring approximately 6 cm in transverse diameter. Other: There is a right inguinal hernia containing small amount of fluid. The right testicle appears to be somewhat high-riding and within the right inguinal canal. Correlation with clinical exam recommended. Musculoskeletal: There is osteopenia with scoliosis and degenerative changes of the spine. No acute osseous pathology. IMPRESSION: VASCULAR 1. No CT evidence of mesenteric ischemia as clinically concerned. No pneumatosis or portal venous gas. 2. Moderate atherosclerotic disease of the aorta with calcified and  noncalcified plaque. The aorta is ectatic measuring up to 2.5 cm in diameter. No aneurysmal dilatation or evidence of dissection. 3. Circumferential soft tissue density surrounding the origins of the celiac trunk and SMA as seen on the prior CT most likely representing inflammatory changes and vasculitis. The origins of the celiac axis, SMA, and IMA however remain patent. 4. Sigmoid diverticulosis without definite evidence of acute diverticulitis. Thickened appearance of the sigmoid colon may be related to underdistention. Correlation with clinical exam is recommended to exclude colitis. 5.  Aortic Atherosclerosis (ICD10-I70.0). 6. Other nonemergent findings including right posterior basal subpleural nodule similar to prior CT, enlarged prostate gland, liver cysts, scoliosis and degenerative changes of the spine, and renal cysts. NON-VASCULAR Electronically Signed   By: Anner Crete M.D.   On: 06/26/2017 18:54    Impression/Plan: 83 yo with severe abdominal pain and CT angiogram showing mesenteric vasculitis and symptoms worsened despite oral steroids. Needs a short course of IV steroids (Solumedrol 60 mg IV Q 12 hours). IVFs. Clear liquid diet. Supportive care. If symptoms improve then transition to oral steroids in 48 hours and slowly advance diet. If no improvement then may need surgical consult. Will follow.    LOS: 1 day   Hick[place] C.  06/27/2017, 9:37 AM  Pager [phone]  AFTER 5 pm or on weekends please call [phone]

## 2017-06-27 NOTE — ED Notes (Signed)
Lunch tray ordered 

## 2017-06-27 NOTE — ED Notes (Signed)
Pt talking w/spouse at bedside.

## 2017-06-27 NOTE — ED Notes (Signed)
Woke pt so may eat breakfast - clear liquids.

## 2017-06-27 NOTE — ED Notes (Signed)
Pt talking on phone w/his daughter.

## 2017-06-27 NOTE — ED Notes (Signed)
Pt tolerated liquids well. Friend visiting w/pt.

## 2017-06-27 NOTE — ED Notes (Signed)
Spouse leaving at this time. Pt lying on bed, alert. nad.

## 2017-06-27 NOTE — Progress Notes (Signed)
Alexander Farley is a 83 y.o. male patient admitted from ED awake, alert - oriented  X 4 - no acute distress noted.  VSS - Blood pressure (!) 138/93, pulse 62, temperature 98.8 F (37.1 C), temperature source Oral, resp. rate 16, height 5\' 9"  (1.753 m), weight 61.6 kg (135 lb 11.2 oz), SpO2 100 %.    IV in place, occlusive dsg intact without redness.  Orientation to room, and floor completed with information packet given to patient/family.  Patient declined safety video at this time.  Admission INP armband ID verified with patient/family, and in place.   SR up x 2, fall assessment complete, with patient and family able to verbalize understanding of risk associated with falls, and verbalized understanding to call nsg before up out of bed.  Call light within reach, patient able to voice, and demonstrate understanding.  Skin, clean-dry- intact without evidence of bruising, or skin tears.   No evidence of skin break down noted on exam.     Will cont to eval and treat per MD orders.  [place], RN 06/27/2017 4:25 PM

## 2017-06-27 NOTE — Progress Notes (Signed)
PROGRESS NOTE    Alexander Farley  GYI:[id number] DOB: [birth year] DOA: 06/26/2017 PCP: [name], MD      Brief Narrative:  83 yo M with CAD, HTN, peripheral vascular disease presents with 2 months abdominal discomfort, nausea, post-prandial angina. CT by GI last week showed vasculitis of the celiac/SMA.  Started oral steroids, no improvement, admitted for IV steroids.   Assessment & Plan:  Principal Problem:   Vasculitis of mesenteric artery (HCC) Active Problems:   CAD (coronary artery disease), S/p stenting of his ostial LAD- 2006.  CABG 2011     HTN (hypertension)   Mesenteric artery vasculitis -Follow-up ANCA, ANA, complements, cryoglobulin -Solu-Medrol per GI   Hypertension and CV disease secondary prevention -Continue aspirin, statin, beta-blocker, lisinopril  Other medications -Continue PPI, Flomax, tramadol       DVT prophylaxis: Lovenox Code Status: Full code Family Communication: Wife at the bedside Disposition Plan: Per GI   Consultants:   Equal gastroenterology  Procedures:   None  Antimicrobials:   None   Subjective: Patient feels well, was eating some beef broth, no pain at present.  No fevers, rashes, joint pains.  Objective: Vitals:   06/27/17 0505 06/27/17 1300 06/27/17 1548 06/27/17 1609  BP: 123/76 (!) 160/88 137/78 (!) 138/93  Pulse: 62 62 62 62  Resp: 16 (!) 24 18 16   Temp: 98.1 F (36.7 C) 97.9 F (36.6 C)  98.8 F (37.1 C)  TempSrc: Oral Oral  Oral  SpO2: 97% 100% 100% 100%  Weight:    61.6 kg (135 lb 11.2 oz)  Height:    5\' 9"  (1.753 m)    Intake/Output Summary (Last 24 hours) at 06/27/17 1846 Last data filed at 06/27/17 0336  Gross per 24 hour  Intake              500 ml  Output              200 ml  Net              300 ml   Filed Weights   06/26/17 2121 06/27/17 1609  Weight: 67 kg (147 lb 11.3 oz) 61.6 kg (135 lb 11.2 oz)    Examination: General appearance: Elderly adult male, alert and in no acute  distress.   HEENT: Anicteric, conjunctiva pink, lids and lashes normal. No nasal deformity, discharge, epistaxis.  Lips moist.   Skin: Warm and dry.   No suspicious rashes or lesions. Cardiac: RRR, nl S1-S2, no murmurs appreciated.  Capillary refill is brisk.  JVP not visible.  No LE edema.  Radial pulses 2+ and symmetric. Respiratory: Normal respiratory rate and rhythm.  CTAB without rales or wheezes. Abdomen: Abdomen soft.  No TTP. No ascites, distension, hepatosplenomegaly.   MSK: No deformities or effusions. Neuro: Awake and alert.  EOMI, moves all extremities. Speech fluent.    Psych: Sensorium intact and responding to questions, attention normal. Affect ormal.  Judgment and insight appear normal.    Data Reviewed: I have personally reviewed following labs and imaging studies:  CBC:  Recent Labs Lab 06/26/17 1148  WBC 10.6*  HGB 12.7*  HCT 39.6  MCV 87.0  PLT 350   Basic Metabolic Panel:  Recent Labs Lab 06/26/17 1148 06/27/17 0445  NA 137 138  K 4.0 4.0  CL 104 105  CO2 24 28  GLUCOSE 105* 90  BUN 15 13  CREATININE 0.86 0.83  CALCIUM 9.2 8.7*   GFR: Estimated Creatinine Clearance: 59.8 mL/min (by C-G  formula based on SCr of 0.83 mg/dL). Liver Function Tests:  Recent Labs Lab 06/26/17 1148  AST 32  ALT 32  ALKPHOS 86  BILITOT 0.8  PROT 6.5  ALBUMIN 3.7    Recent Labs Lab 06/26/17 1148  LIPASE 42   No results for input(s): AMMONIA in the last 168 hours. Coagulation Profile: No results for input(s): INR, PROTIME in the last 168 hours. Cardiac Enzymes: No results for input(s): CKTOTAL, CKMB, CKMBINDEX, TROPONINI in the last 168 hours. BNP (last 3 results) No results for input(s): PROBNP in the last 8760 hours. HbA1C: No results for input(s): HGBA1C in the last 72 hours. CBG: No results for input(s): GLUCAP in the last 168 hours. Lipid Profile: No results for input(s): CHOL, HDL, LDLCALC, TRIG, CHOLHDL, LDLDIRECT in the last 72 hours. Thyroid  Function Tests: No results for input(s): TSH, T4TOTAL, FREET4, T3FREE, THYROIDAB in the last 72 hours. Anemia Panel: No results for input(s): VITAMINB12, FOLATE, FERRITIN, TIBC, IRON, RETICCTPCT in the last 72 hours. Urine analysis:    Component Value Date/Time   COLORURINE AMBER (A) 06/26/2017 1150   APPEARANCEUR HAZY (A) 06/26/2017 1150   LABSPEC 1.027 06/26/2017 1150   PHURINE 5.0 06/26/2017 1150   GLUCOSEU NEGATIVE 06/26/2017 [place] 06/26/2017 [place] 06/26/2017 1150   KETONESUR NEGATIVE 06/26/2017 1150   PROTEINUR NEGATIVE 06/26/2017 1150   UROBILINOGEN 1.0 11/16/2013 0405   NITRITE NEGATIVE 06/26/2017 1150   LEUKOCYTESUR NEGATIVE 06/26/2017 1150   Sepsis Labs: @LABRCNTIP (procalcitonin:4,lacticidven:4)  )No results found for this or any previous visit (from the past 240 hour(s)).       Radiology Studies: Ct Angio Abd/pel W And/or Wo Contrast  Result Date: 06/26/2017 CLINICAL DATA:  83 year old male with abdominal pain and loss of appetite. Concern for mesenteric ischemia. EXAM: CTA ABDOMEN AND PELVIS wITHOUT AND WITH CONTRAST TECHNIQUE: Multidetector CT imaging of the abdomen and pelvis was performed using the standard protocol during bolus administration of intravenous contrast. Multiplanar reconstructed images and MIPs were obtained and reviewed to evaluate the vascular anatomy. CONTRAST:  100 cc Isovue 370 COMPARISON:  Abdominal CT dated 06/22/2017 FINDINGS: VASCULAR Aorta: There is moderate calcified and noncalcified plaque along the course of the aorta. The aorta is mildly tortuous and ectatic measuring up to 2.5 cm in diameter. There is noncalcified mural plaque in the periphery of the distal abdominal aorta without significant luminal narrowing. There is no aortic dissection. Celiac: The celiac axis is patent. There is ill-defined circumferential thickening of the origin of the celiac axis as seen on the prior CT likely representing  inflammatory changes and vasculitis. SMA: Ill-defined circumferential soft tissue density involving the SMA likely representing inflammatory changes and vasculitis. The SMA remains patent however demonstrates a slightly irregular and beaded appearance. There is a replaced right hepatic artery arising from the SMA. Renals: The renal arteries are patent. No aneurysmal dilatation or evidence of dissection. IMA: There is atherosclerotic calcification of the origin of the IMA. The IMA however remains patent. Inflow: There is advanced atherosclerotic calcification of the iliac arteries primarily involving the internal iliacs. 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 The common iliac arteries , and external iliac arteries are widely patent without aneurysmal dilatation or dissection. The internal iliac arteries also remain patent despite advanced atherosclerotic disease. Proximal Outflow: Bilateral common femoral and visualized portions of the superficial and profunda femoral arteries are patent without evidence of aneurysm, dissection,  vasculitis or significant stenosis. Veins: No obvious venous abnormality within the limitations of this arterial phase study. Review of the MIP images confirms the above findings. NON-VASCULAR Lower chest: The visualized lung bases are clear. There is a 2 cm pneumatocele at the right lung base. Stable 8 x 5 mm nodularity in the posterior basal segment of the right lower lobe (series 13 image 19). Follow-up as recommended on the CT of 06/22/2017. There is mild cardiomegaly. There is no intra-abdominal free air. Small free fluid noted within the pelvis. Hepatobiliary: Multiple scattered hypodense lesions within the liver most consistent with cysts. The gallbladder is predominantly contracted and appears grossly unremarkable. Pancreas: Unremarkable. No pancreatic ductal dilatation or surrounding inflammatory changes. Spleen:  Normal in size without focal abnormality. Adrenals/Urinary Tract: The adrenal glands are unremarkable. There is a 4.5 cm right renal inferior pole cyst as well as scattered subcentimeter hypodensities which are too small to characterize. There is no hydronephrosis on either side. The urinary bladder is grossly unremarkable. Stomach/Bowel: There is sigmoid diverticulosis. There is mild thickened appearance of the sigmoid colon, likely related to underdistention. Colitis is less likely but not entirely excluded. Clinical correlation is recommended. Mildly thickened appearance of the wall of the stomach also likely secondary to underdistention. Correlation with clinical exam is recommended to exclude gastritis. The there is no bowel obstruction. No bowel pneumatosis. The appendix is unremarkable. Lymphatic: No adenopathy. Reproductive: The prostate gland is enlarged and heterogeneous measuring approximately 6 cm in transverse diameter. Other: There is a right inguinal hernia containing small amount of fluid. The right testicle appears to be somewhat high-riding and within the right inguinal canal. Correlation with clinical exam recommended. Musculoskeletal: There is osteopenia with scoliosis and degenerative changes of the spine. No acute osseous pathology. IMPRESSION: VASCULAR 1. No CT evidence of mesenteric ischemia as clinically concerned. No pneumatosis or portal venous gas. 2. Moderate atherosclerotic disease of the aorta with calcified and noncalcified plaque. The aorta is ectatic measuring up to 2.5 cm in diameter. No aneurysmal dilatation or evidence of dissection. 3. Circumferential soft tissue density surrounding the origins of the celiac trunk and SMA as seen on the prior CT most likely representing inflammatory changes and vasculitis. The origins of the celiac axis, SMA, and IMA however remain patent. 4. Sigmoid diverticulosis without definite evidence of acute diverticulitis. Thickened appearance of the  sigmoid colon may be related to underdistention. Correlation with clinical exam is recommended to exclude colitis. 5.  Aortic Atherosclerosis (ICD10-I70.0). 6. Other nonemergent findings including right posterior basal subpleural nodule similar to prior CT, enlarged prostate gland, liver cysts, scoliosis and degenerative changes of the spine, and renal cysts. NON-VASCULAR Electronically Signed   By: Anner Crete M.D.   On: 06/26/2017 18:54        Scheduled Meds: . aspirin  81 mg Oral Daily  . atorvastatin  80 mg Oral Daily  . carvedilol  6.25 mg Oral BID  . enoxaparin (LOVENOX) injection  40 mg Subcutaneous Q24H  . famotidine  20 mg Oral BID  . feeding supplement  1 Container Oral TID BM  . folic acid  1 mg Oral Daily  . lisinopril  10 mg Oral Daily  . methylPREDNISolone (SOLU-MEDROL) injection  60 mg Intravenous Q12H  . multivitamin with minerals   Oral Daily  . pantoprazole  20 mg Oral Daily  . potassium chloride  10 mEq Oral Daily  . tamsulosin  0.4 mg Oral Daily   Continuous Infusions: . sodium chloride 75 mL/hr at  06/26/17 2051     LOS: 1 day    Time spent: 15 minutes    Edwin Dada, MD [facility] Pager [phone]  If 7PM-7AM, please contact night-coverage [email or web address] Password [email or web address] 06/27/2017, 6:46 PM

## 2017-06-27 NOTE — ED Notes (Signed)
Son visiting w/pt.

## 2017-06-27 NOTE — ED Notes (Signed)
Pt being transported via bed to 5W14.

## 2017-06-28 DIAGNOSIS — I776 Arteritis, unspecified: Principal | ICD-10-CM

## 2017-06-28 DIAGNOSIS — I2510 Atherosclerotic heart disease of native coronary artery without angina pectoris: Secondary | ICD-10-CM

## 2017-06-28 DIAGNOSIS — I10 Essential (primary) hypertension: Secondary | ICD-10-CM

## 2017-06-28 DIAGNOSIS — K219 Gastro-esophageal reflux disease without esophagitis: Secondary | ICD-10-CM

## 2017-06-28 DIAGNOSIS — N400 Enlarged prostate without lower urinary tract symptoms: Secondary | ICD-10-CM

## 2017-06-28 LAB — C3 COMPLEMENT: C3 COMPLEMENT: 133 mg/dL (ref 82–167)

## 2017-06-28 LAB — C4 COMPLEMENT: COMPLEMENT C4, BODY FLUID: 28 mg/dL (ref 14–44)

## 2017-06-28 LAB — ANCA TITERS: Atypical P-ANCA titer: <1:20 {titer}

## 2017-06-28 LAB — ANTINUCLEAR ANTIBODIES, IFA: ANA Ab, IFA: NEGATIVE

## 2017-06-28 MED ORDER — GI COCKTAIL ~~LOC~~
30.0000 mL | Freq: Three times a day (TID) | ORAL | Status: DC | PRN
  Administered 2017-06-28: 30 mL via ORAL
  Filled 2017-06-28: qty 30

## 2017-06-28 NOTE — Progress Notes (Signed)
PROGRESS NOTE    Alexander Farley   YKD:[id number]  DOB: December 23, 1932  DOA: 06/26/2017 PCP: [name], MD   Brief Narrative:  Alexander Farley is a 83 y.o. male with medical history significant of CAD.  Patient presents to the ED with c/o ongoing abd pain over past several weeks.  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  Started on prednisone 20mg  BID by Dr. Michail Sermon however this was not enough to control symptoms. He was admitted for IV antibiotics.     Subjective: Had some burning in upper abdomen immediately after beginning to drink clear liquids today. It is now improving.   Assessment & Plan:   Principal Problem:   Vasculitis of mesenteric artery  - cont steroids and diet per GI -can try GI cocktail to see if burning resolves  Active Problems:   CAD (coronary artery disease), S/p stenting of his ostial LAD- 2006.  CABG 2011     HTN (hypertension) -Continue aspirin, statin, beta-blocker, lisinopril  GERD - cont Pepcid and Protonix  BPH - cont Flomax  DVT prophylaxis: Lovenox Code Status: Full code Family Communication: wife  Disposition Plan: stable Consultants:   GI Procedures:   none Antimicrobials:  Anti-infectives    None       Objective: Vitals:   06/27/17 1609 06/27/17 2124 06/28/17 0516 06/28/17 1003  BP: (!) 138/93 132/85 131/80 (!) 154/97  Pulse: 62 74 70 74  Resp: 16 18 16 17   Temp: 98.8 F (37.1 C) 97.7 F (36.5 C) 97.7 F (36.5 C) 97.8 F (36.6 C)  TempSrc: Oral Oral Oral Oral  SpO2: 100% 100% 100% 100%  Weight: 61.6 kg (135 lb 11.2 oz)     Height: 5\' 9"  (1.753 m)       Intake/Output Summary (Last 24 hours) at 06/28/17 1534 Last data filed at 06/28/17 0600  Gross per 24 hour  Intake             1000 ml  Output              300 ml  Net              700 ml   Filed Weights   06/26/17 2121 06/27/17 1609  Weight: 67 kg (147 lb 11.3 oz) 61.6 kg (135 lb 11.2 oz)    Examination: General exam: Appears  comfortable  HEENT: PERRLA, oral mucosa moist, no sclera icterus or thrush Respiratory system: Clear to auscultation. Respiratory effort normal. Cardiovascular system: S1 & S2 heard, RRR.  No murmurs  Gastrointestinal system: Abdomen soft, mildly tender in mid abdomen, nondistended. Normal bowel sound. No organomegaly Central nervous system: Alert and oriented. No focal neurological deficits. Extremities: No cyanosis, clubbing or edema Skin: No rashes or ulcers Psychiatry:  Mood & affect appropriate.     Data Reviewed: I have personally reviewed following labs and imaging studies  CBC:  Recent Labs Lab 06/26/17 1148  WBC 10.6*  HGB 12.7*  HCT 39.6  MCV 87.0  PLT 397   Basic Metabolic Panel:  Recent Labs Lab 06/26/17 1148 06/27/17 0445  NA 137 138  K 4.0 4.0  CL 104 105  CO2 24 28  GLUCOSE 105* 90  BUN 15 13  CREATININE 0.86 0.83  CALCIUM 9.2 8.7*   GFR: Estimated Creatinine Clearance: 59.8 mL/min (by C-G formula based on SCr of 0.83 mg/dL). Liver Function Tests:  Recent Labs Lab 06/26/17 1148  AST 32  ALT 32  ALKPHOS 86  BILITOT 0.8  PROT 6.5  ALBUMIN 3.7    Recent Labs Lab 06/26/17 1148  LIPASE 42   No results for input(s): AMMONIA in the last 168 hours. Coagulation Profile: No results for input(s): INR, PROTIME in the last 168 hours. Cardiac Enzymes: No results for input(s): CKTOTAL, CKMB, CKMBINDEX, TROPONINI in the last 168 hours. BNP (last 3 results) No results for input(s): PROBNP in the last 8760 hours. HbA1C: No results for input(s): HGBA1C in the last 72 hours. CBG: No results for input(s): GLUCAP in the last 168 hours. Lipid Profile: No results for input(s): CHOL, HDL, LDLCALC, TRIG, CHOLHDL, LDLDIRECT in the last 72 hours. Thyroid Function Tests: No results for input(s): TSH, T4TOTAL, FREET4, T3FREE, THYROIDAB in the last 72 hours. Anemia Panel: No results for input(s): VITAMINB12, FOLATE, FERRITIN, TIBC, IRON, RETICCTPCT in the  last 72 hours. Urine analysis:    Component Value Date/Time   COLORURINE AMBER (A) 06/26/2017 1150   APPEARANCEUR HAZY (A) 06/26/2017 1150   LABSPEC 1.027 06/26/2017 1150   PHURINE 5.0 06/26/2017 1150   GLUCOSEU NEGATIVE 06/26/2017 [place] 06/26/2017 [place] 06/26/2017 1150   KETONESUR NEGATIVE 06/26/2017 1150   PROTEINUR NEGATIVE 06/26/2017 1150   UROBILINOGEN 1.0 11/16/2013 0405   NITRITE NEGATIVE 06/26/2017 1150   LEUKOCYTESUR NEGATIVE 06/26/2017 1150   Sepsis Labs: @LABRCNTIP (procalcitonin:4,lacticidven:4) )No results found for this or any previous visit (from the past 240 hour(s)).       Radiology Studies: Ct Angio Abd/pel W And/or Wo Contrast  Result Date: 06/26/2017 CLINICAL DATA:  83 year old male with abdominal pain and loss of appetite. Concern for mesenteric ischemia. EXAM: CTA ABDOMEN AND PELVIS wITHOUT AND WITH CONTRAST TECHNIQUE: Multidetector CT imaging of the abdomen and pelvis was performed using the standard protocol during bolus administration of intravenous contrast. Multiplanar reconstructed images and MIPs were obtained and reviewed to evaluate the vascular anatomy. CONTRAST:  100 cc Isovue 370 COMPARISON:  Abdominal CT dated 06/22/2017 FINDINGS: VASCULAR Aorta: There is moderate calcified and noncalcified plaque along the course of the aorta. The aorta is mildly tortuous and ectatic measuring up to 2.5 cm in diameter. There is noncalcified mural plaque in the periphery of the distal abdominal aorta without significant luminal narrowing. There is no aortic dissection. Celiac: The celiac axis is patent. There is ill-defined circumferential thickening of the origin of the celiac axis as seen on the prior CT likely representing inflammatory changes and vasculitis. SMA: Ill-defined circumferential soft tissue density involving the SMA likely representing inflammatory changes and vasculitis. The SMA remains patent however demonstrates a  slightly irregular and beaded appearance. There is a replaced right hepatic artery arising from the SMA. Renals: The renal arteries are patent. No aneurysmal dilatation or evidence of dissection. IMA: There is atherosclerotic calcification of the origin of the IMA. The IMA however remains patent. Inflow: There is advanced atherosclerotic calcification of the iliac arteries primarily involving the internal iliacs. 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 The common iliac arteries , and external iliac arteries are widely patent without aneurysmal dilatation or dissection. The internal iliac arteries also remain patent despite advanced atherosclerotic disease. Proximal Outflow: Bilateral common femoral and visualized portions of the superficial and profunda femoral arteries are patent without evidence of aneurysm, dissection, vasculitis or significant stenosis. Veins: No obvious venous abnormality within the limitations of this arterial phase study. Review of the MIP images confirms the above findings. NON-VASCULAR  Lower chest: The visualized lung bases are clear. There is a 2 cm pneumatocele at the right lung base. Stable 8 x 5 mm nodularity in the posterior basal segment of the right lower lobe (series 13 image 19). Follow-up as recommended on the CT of 06/22/2017. There is mild cardiomegaly. There is no intra-abdominal free air. Small free fluid noted within the pelvis. Hepatobiliary: Multiple scattered hypodense lesions within the liver most consistent with cysts. The gallbladder is predominantly contracted and appears grossly unremarkable. Pancreas: Unremarkable. No pancreatic ductal dilatation or surrounding inflammatory changes. Spleen: Normal in size without focal abnormality. Adrenals/Urinary Tract: The adrenal glands are unremarkable. There is a 4.5 cm right renal inferior pole cyst as well as scattered subcentimeter hypodensities which are  too small to characterize. There is no hydronephrosis on either side. The urinary bladder is grossly unremarkable. Stomach/Bowel: There is sigmoid diverticulosis. There is mild thickened appearance of the sigmoid colon, likely related to underdistention. Colitis is less likely but not entirely excluded. Clinical correlation is recommended. Mildly thickened appearance of the wall of the stomach also likely secondary to underdistention. Correlation with clinical exam is recommended to exclude gastritis. The there is no bowel obstruction. No bowel pneumatosis. The appendix is unremarkable. Lymphatic: No adenopathy. Reproductive: The prostate gland is enlarged and heterogeneous measuring approximately 6 cm in transverse diameter. Other: There is a right inguinal hernia containing small amount of fluid. The right testicle appears to be somewhat high-riding and within the right inguinal canal. Correlation with clinical exam recommended. Musculoskeletal: There is osteopenia with scoliosis and degenerative changes of the spine. No acute osseous pathology. IMPRESSION: VASCULAR 1. No CT evidence of mesenteric ischemia as clinically concerned. No pneumatosis or portal venous gas. 2. Moderate atherosclerotic disease of the aorta with calcified and noncalcified plaque. The aorta is ectatic measuring up to 2.5 cm in diameter. No aneurysmal dilatation or evidence of dissection. 3. Circumferential soft tissue density surrounding the origins of the celiac trunk and SMA as seen on the prior CT most likely representing inflammatory changes and vasculitis. The origins of the celiac axis, SMA, and IMA however remain patent. 4. Sigmoid diverticulosis without definite evidence of acute diverticulitis. Thickened appearance of the sigmoid colon may be related to underdistention. Correlation with clinical exam is recommended to exclude colitis. 5.  Aortic Atherosclerosis (ICD10-I70.0). 6. Other nonemergent findings including right posterior  basal subpleural nodule similar to prior CT, enlarged prostate gland, liver cysts, scoliosis and degenerative changes of the spine, and renal cysts. NON-VASCULAR Electronically Signed   By: Anner Crete M.D.   On: 06/26/2017 18:54      Scheduled Meds: . aspirin  81 mg Oral Daily  . atorvastatin  80 mg Oral Daily  . carvedilol  6.25 mg Oral BID  . enoxaparin (LOVENOX) injection  40 mg Subcutaneous Q24H  . famotidine  20 mg Oral BID  . feeding supplement  1 Container Oral TID BM  . folic acid  1 mg Oral Daily  . lisinopril  10 mg Oral Daily  . methylPREDNISolone (SOLU-MEDROL) injection  60 mg Intravenous Q12H  . multivitamin with minerals   Oral Daily  . pantoprazole  20 mg Oral Daily  . potassium chloride  10 mEq Oral Daily  . tamsulosin  0.4 mg Oral Daily   Continuous Infusions: . sodium chloride 75 mL/hr at 06/28/17 0956     LOS: 2 days    Time spent in minutes: 35    Debbe Odea, MD [facility] Pager: [email or web address] Password [email or web address]  06/28/2017, 3:34 PM

## 2017-06-28 NOTE — Progress Notes (Signed)
   06/28/17 1500  Clinical Encounter Type  Visited With Patient and family together  Visit Type Spiritual support  Referral From Nurse  Consult/Referral To [place] visited with the PT in room as wife was present.  The family was very open to prayer as their request was in the system for that.  The PT shared how God has blocked many things in his life.   The PT also shared wisdom of over 30 years of marriage.  The wife of the PT shared in the prophetic wisdom of my visit to the PT

## 2017-06-28 NOTE — Progress Notes (Signed)
[facility] Progress Note  Alexander Farley 83 y.o. 01/17/1933   Subjective: Doing better. Abdominal pain is less. Tolerating clear liquids without difficulty.  Objective: Vital signs: Vitals:   06/27/17 2124 06/28/17 0516  BP: 132/85 131/80  Pulse: 74 70  Resp: 18 16  Temp: 97.7 F (36.5 C) 97.7 F (36.5 C)  SpO2: 100% 100%    Physical Exam: Gen: alert, no acute distress, elderly, thin HEENT: anicteric sclera CV: RRR Chest: CTA B Abd: less tender periumbilically with guarding, soft, nondistended, +BS Ext: no edema  Lab Results:  Recent Labs  06/26/17 1148 06/27/17 0445  NA 137 138  K 4.0 4.0  CL 104 105  CO2 24 28  GLUCOSE 105* 90  BUN 15 13  CREATININE 0.86 0.83  CALCIUM 9.2 8.7*    Recent Labs  06/26/17 1148  AST 32  ALT 32  ALKPHOS 86  BILITOT 0.8  PROT 6.5  ALBUMIN 3.7    Recent Labs  06/26/17 1148  WBC 10.6*  HGB 12.7*  HCT 39.6  MCV 87.0  PLT 204      Assessment/Plan: Mesenteric vasculitis - clinically improving on IV steroids. Change to full liquids and slowly advance as tolerated. Will transition to PO Prednisone 60 mg/day tomorrow if he continues to do ok. Consider d/c in 1-2 days if tolerates diet advancing and abd pain is manageable.   [place] C. 06/28/2017, 9:16 AM  Pager [phone]  AFTER 5 PM or on weekends please call [phone] ID: Alexander Farley, male   DOB: April 04, 1933, 83 y.o.   MRN: [id number]

## 2017-06-28 NOTE — Progress Notes (Signed)
Initial Nutrition Assessment  DOCUMENTATION CODES:   Not applicable  INTERVENTION:  1. Continue Boost Breeze po TID, each supplement provides 250 kcal and 9 grams of protein 2. Continue MVI w/ minerals  NUTRITION DIAGNOSIS:   Inadequate oral intake related to nausea, poor appetite as evidenced by per patient/family report, meal completion < 25%, percent weight loss.  GOAL:   Patient will meet greater than or equal to 90% of their needs  MONITOR:   PO intake, I & O's, Labs, Supplement acceptance, Weight trends  REASON FOR ASSESSMENT:   Malnutrition Screening Tool    ASSESSMENT:   Mr. 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 He reports a burning sensation with PO intake x2 months. Started out eating ok but eventually progressed to eating very little food. For the past week patient states he was eating a couple spoonfuls of grits and 1/2 a piece of spam or a couple spoonfuls of oatmeal for breakfast. "Not much" for lunch. Some sort of soup for dinner, like potato soup or chicken noodle soup but wife states it got to the point where he could only drink the broth, and was still getting sick with PO intake. Also reports consuming a boost breeze and an ensure daily. Has received GI cocktail in the past, states it helps with his burning and causes a numbing feeling. He reports being about 150 pounds prior to onset of illness, but he is now down to 135 pounds, a 10% severe weight loss for timeframe. Despite his burning and pain with PO intake, patient states he never vomited. Denies any issues chewing/swallowing. Improving on IV steroids per GI. Maintained muscle stores despite decreased PO intake and weight loss. Encouraged them to continue consuming boost/ensure when not hungry to maintain weight, muscle stores, strength and immunity. Verbalized  understanding.  Labs reviewed Medications reviewed and include:  Prednisone, Folic Acid 87O+ NS at 67EH/MC   NUTRITION - FOCUSED PHYSICAL EXAM: WNL   Diet Order:  Diet full liquid Room service appropriate? Yes; Fluid consistency: Thin  EDUCATION NEEDS:   Education needs have been addressed  Skin:  Skin Assessment: Reviewed RN Assessment  Last BM:  06/27/2017  Height:   Ht Readings from Last 1 Encounters:  06/27/17 5\' 9"  (1.753 m)    Weight:   Wt Readings from Last 1 Encounters:  06/27/17 135 lb 11.2 oz (61.6 kg)    Ideal Body Weight:  72.72 kg  BMI:  Body mass index is 20.04 kg/m.  Estimated Nutritional Needs:   Kcal:  1500-1800 calories  Protein:  79-92 grams  Fluid:  1.5-1.8L  Satira Anis. Jlynn Langille, MS, RD LDN Inpatient Clinical Dietitian Pager [phone]]

## 2017-06-29 DIAGNOSIS — R1084 Generalized abdominal pain: Secondary | ICD-10-CM

## 2017-06-29 MED ORDER — PREDNISONE 50 MG PO TABS
60.0000 mg | ORAL_TABLET | Freq: Every day | ORAL | Status: DC
  Administered 2017-06-30: 60 mg via ORAL
  Filled 2017-06-29: qty 1

## 2017-06-29 NOTE — Progress Notes (Signed)
[facility] Gastroenterology Progress Note  Alexander Farley 83 y.o. [birth year]   Subjective: Feels great. Tolerating soft food without difficulty. Denies abdominal pain. Wife and son at bedside.  Objective: Vital signs: Vitals:   06/29/17 0638 06/29/17 1418  BP: (!) 145/86 131/75  Pulse: 71 79  Resp: 17 16  Temp: 98.3 F (36.8 C) 97.8 F (36.6 C)  SpO2: 99% 99%    Physical Exam: Gen: alert, no acute distress, elderly, thin HEENT: anicteric sclera CV: RRR Chest: CTA B Abd: minimal periumbilical tenderness without tenderness,  Ext: no edema  Lab Results:  Recent Labs  06/27/17 0445  NA 138  K 4.0  CL 105  CO2 28  GLUCOSE 90  BUN 13  CREATININE 0.83  CALCIUM 8.7*   No results for input(s): AST, ALT, ALKPHOS, BILITOT, PROT, ALBUMIN in the last 72 hours. No results for input(s): WBC, NEUTROABS, HGB, HCT, MCV, PLT in the last 72 hours.    Assessment/Plan: 83 yo with mesenteric vasculitis improved on IV steroids. Tolerating solid food. Ok to change to Prednisone 60 mg PO QD and d/c tomorrow if doing ok. Will cancel previously planned EGD/colon since source of abdominal pain found to be mesenteric vasculitis and symptoms have improved on steroids.    Lydia C. 06/29/2017, 3:23 PM  Pager [phone]  AFTER 5 PM or on weekends please call [phone] ID: Alexander Farley, male   DOB: [birth year], 83 y.o.   MRN: [id number]

## 2017-06-29 NOTE — Progress Notes (Signed)
PROGRESS NOTE    [name]   YSA:[id number]  DOB: August 28, 1933  DOA: 06/26/2017 PCP: [name], MD   Brief Narrative:  Alexander Farley is a 83 y.o. male with medical history significant of CAD.  Patient presents to the ED with c/o ongoing abd pain over past several weeks.  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  Started on prednisone 20mg  BID by Dr. Michail Sermon however this was not enough to control symptoms. He was admitted for IV antibiotics.     Subjective:  Doing well with liquids without pain today. Burning with meal improved with GI cocktail yesterday and did not recur  Assessment & Plan:   Principal Problem:   Vasculitis of mesenteric artery  - cont steroids and diet per GI- will taper to Prednisone and increase diet to soft food  - GI cocktail did help resolve burning with meals after yesterday- cont Pepcid and Protonix  Active Problems:   CAD (coronary artery disease), S/p stenting of his ostial LAD- 2006.  CABG 2011     HTN (hypertension) -Continue aspirin, statin, beta-blocker, lisinopril  GERD - cont Pepcid and Protonix  BPH - cont Flomax  DVT prophylaxis: Lovenox Code Status: Full code Family Communication: wife  Disposition Plan: stable Consultants:   GI Procedures:   none Antimicrobials:  Anti-infectives    None       Objective: Vitals:   06/28/17 1543 06/28/17 2316 06/29/17 0638 06/29/17 1418  BP: 102/73 129/70 (!) 145/86 131/75  Pulse: 73 61 71 79  Resp: 16 16 17 16   Temp: 98.1 F (36.7 C) 98.1 F (36.7 C) 98.3 F (36.8 C) 97.8 F (36.6 C)  TempSrc: Oral Oral Oral Oral  SpO2: 98% 99% 99% 99%  Weight:      Height:        Intake/Output Summary (Last 24 hours) at 06/29/17 1751 Last data filed at 06/29/17 1419  Gross per 24 hour  Intake             1260 ml  Output              550 ml  Net              710 ml   Filed Weights   06/26/17 2121 06/27/17 1609  Weight: 67 kg (147 lb 11.3 oz) 61.6 kg  (135 lb 11.2 oz)    Examination: General exam: Appears comfortable  HEENT: PERRLA, oral mucosa moist, no sclera icterus or thrush Respiratory system: Clear to auscultation. Respiratory effort normal. Cardiovascular system: S1 & S2 heard, RRR.  No murmurs  Gastrointestinal system: Abdomen soft,  less tender in mid abdomen, nondistended. Normal bowel sound. No organomegaly Central nervous system: Alert and oriented. No focal neurological deficits. Extremities: No cyanosis, clubbing or edema Skin: No rashes or ulcers Psychiatry:  Mood & affect appropriate.     Data Reviewed: I have personally reviewed following labs and imaging studies  CBC:  Recent Labs Lab 06/26/17 1148  WBC 10.6*  HGB 12.7*  HCT 39.6  MCV 87.0  PLT 323   Basic Metabolic Panel:  Recent Labs Lab 06/26/17 1148 06/27/17 0445  NA 137 138  K 4.0 4.0  CL 104 105  CO2 24 28  GLUCOSE 105* 90  BUN 15 13  CREATININE 0.86 0.83  CALCIUM 9.2 8.7*   GFR: Estimated Creatinine Clearance: 59.8 mL/min (by C-G formula based on SCr of 0.83 mg/dL). Liver Function Tests:  Recent Labs Lab 06/26/17  1148  AST 32  ALT 32  ALKPHOS 86  BILITOT 0.8  PROT 6.5  ALBUMIN 3.7    Recent Labs Lab 06/26/17 1148  LIPASE 42   No results for input(s): AMMONIA in the last 168 hours. Coagulation Profile: No results for input(s): INR, PROTIME in the last 168 hours. Cardiac Enzymes: No results for input(s): CKTOTAL, CKMB, CKMBINDEX, TROPONINI in the last 168 hours. BNP (last 3 results) No results for input(s): PROBNP in the last 8760 hours. HbA1C: No results for input(s): HGBA1C in the last 72 hours. CBG: No results for input(s): GLUCAP in the last 168 hours. Lipid Profile: No results for input(s): CHOL, HDL, LDLCALC, TRIG, CHOLHDL, LDLDIRECT in the last 72 hours. Thyroid Function Tests: No results for input(s): TSH, T4TOTAL, FREET4, T3FREE, THYROIDAB in the last 72 hours. Anemia Panel: No results for input(s):  VITAMINB12, FOLATE, FERRITIN, TIBC, IRON, RETICCTPCT in the last 72 hours. Urine analysis:    Component Value Date/Time   COLORURINE AMBER (A) 06/26/2017 1150   APPEARANCEUR HAZY (A) 06/26/2017 1150   LABSPEC 1.027 06/26/2017 1150   PHURINE 5.0 06/26/2017 1150   GLUCOSEU NEGATIVE 06/26/2017 [place] 06/26/2017 [place] 06/26/2017 1150   KETONESUR NEGATIVE 06/26/2017 1150   PROTEINUR NEGATIVE 06/26/2017 1150   UROBILINOGEN 1.0 11/16/2013 0405   NITRITE NEGATIVE 06/26/2017 1150   LEUKOCYTESUR NEGATIVE 06/26/2017 1150   Sepsis Labs: @LABRCNTIP (procalcitonin:4,lacticidven:4) )No results found for this or any previous visit (from the past 240 hour(s)).       Radiology Studies: No results found.    Scheduled Meds: . aspirin  81 mg Oral Daily  . atorvastatin  80 mg Oral Daily  . carvedilol  6.25 mg Oral BID  . enoxaparin (LOVENOX) injection  40 mg Subcutaneous Q24H  . famotidine  20 mg Oral BID  . feeding supplement  1 Container Oral TID BM  . folic acid  1 mg Oral Daily  . lisinopril  10 mg Oral Daily  . multivitamin with minerals   Oral Daily  . pantoprazole  20 mg Oral Daily  . potassium chloride  10 mEq Oral Daily  . [START ON 06/30/2017] predniSONE  60 mg Oral Q breakfast  . tamsulosin  0.4 mg Oral Daily   Continuous Infusions:    LOS: 3 days    Time spent in minutes: 35    Debbe Odea, MD [facility] Pager: [email or web address] Password [email or web address] 06/29/2017, 5:51 PM

## 2017-06-30 MED ORDER — BOOST / RESOURCE BREEZE PO LIQD: 1.0000 | Freq: Three times a day (TID) | ORAL | 0 refills | Status: DC

## 2017-06-30 MED ORDER — FAMOTIDINE 20 MG PO TABS: 20.0000 mg | ORAL_TABLET | Freq: Two times a day (BID) | ORAL | 3 refills | Status: DC

## 2017-06-30 MED ORDER — PREDNISONE 20 MG PO TABS: 60.0000 mg | ORAL_TABLET | Freq: Every day | ORAL | 2 refills | Status: DC

## 2017-06-30 MED ORDER — TRAMADOL HCL 50 MG PO TABS: 50.0000 mg | ORAL_TABLET | Freq: Four times a day (QID) | ORAL | 0 refills | Status: DC | PRN

## 2017-06-30 MED ORDER — PANTOPRAZOLE SODIUM 20 MG PO TBEC: 20.0000 mg | DELAYED_RELEASE_TABLET | Freq: Every day | ORAL | 0 refills | Status: DC

## 2017-06-30 NOTE — Care Management Important Message (Signed)
Important Message  Patient Details  Name: Alexander Farley MRN: [id number] Date of Birth: April 05, 1933   Medicare Important Message Given:  Yes    Lafawn Lenoir Abena 06/30/2017, 10:12 AM

## 2017-06-30 NOTE — Progress Notes (Signed)
[facility] Gastroenterology Progress Note  Alexander Farley 83 y.o. 02-15-1933   Subjective: Feels ok. Sitting in bedside chair. Having some hiccups this morning. Tolerating diet. Son in room.  Objective: Vital signs: Vitals:   06/30/17 0500 06/30/17 0910  BP: 132/76 123/84  Pulse: (!) 52   Resp: 16   Temp: 98.2 F (36.8 C)   SpO2: 100%     Physical Exam: Gen: alert, no acute distress, elderly, thin, pleasant HEENT: anicteric sclera CV: RRR Chest: CTA B Abd: minimal periumbilical tenderness with minimal guarding, soft, nondistended, +BS  Lab Results: No results for input(s): NA, K, CL, CO2, GLUCOSE, BUN, CREATININE, CALCIUM, MG, PHOS in the last 72 hours. No results for input(s): AST, ALT, ALKPHOS, BILITOT, PROT, ALBUMIN in the last 72 hours. No results for input(s): WBC, NEUTROABS, HGB, HCT, MCV, PLT in the last 72 hours.    Assessment/Plan: Mesenteric vasculitis doing better on steroids. IV steroids stopped yesterday and changed to Prednisone 60 mg PO today. Slight rise in WBC likely due to steroids. Would discharge on Prednisone 60 mg/day and remain on that dose until follow up with me in late November (office will arrange). PPI PO QD needed. Stable to go home today from GI standpoint. Dr. Wynelle Cleveland in room during my evaluation. Will sign off. Call if questions.   [place] C. 06/30/2017, 9:38 AM  Pager [phone]  AFTER 5 PM or on weekends please call [phone] ID: Alexander Farley, male   DOB: 1933/06/05, 83 y.o.   MRN: [id number]

## 2017-06-30 NOTE — Progress Notes (Signed)
Pt's wife arrived on unit and pt escorted to the exit vie wheelchair and D/C'd home via private auto.

## 2017-06-30 NOTE — Discharge Instructions (Signed)
Do not take Ibuprofen, Motrin, Alleve, Naprosyn and stop Aspirin if you begin having heartburn or stomach pain again.  Please take all your medications with you for your next visit with your Primary MD. Please request your Primary MD to go over all hospital test results at the follow up. Please ask your Primary MD to get all Hospital records sent to his/her office.  If you experience worsening of your admission symptoms, develop shortness of breath, chest pain, suicidal or homicidal thoughts or a life threatening emergency, you must seek medical attention immediately by calling 911 or calling your MD.  Dennis Bast must read the complete instructions/literature along with all the possible adverse reactions/side effects for all the medicines you take including new medications that have been prescribed to you. Take new medicines after you have completely understood and accpet all the possible adverse reactions/side effects.   Do not drive when taking pain medications or sedatives.    Do not take more than prescribed Pain, Sleep and Anxiety Medications  If you have smoked or chewed Tobacco in the last 2 yrs please stop. Stop any regular alcohol and or recreational drug use.  Wear Seat belts while driving.

## 2017-06-30 NOTE — Progress Notes (Signed)
Pt reporting mild hiccups, with no other symptoms. Stating they last for a "little while" and then go away on their own. Pt reports 3 episodes today. Will pass on and continue to monitor

## 2017-06-30 NOTE — Progress Notes (Signed)
Alexander Farley to be D/C'd Home per MD order.  Discussed with the patient and all questions fully answered.  VSS, Skin clean, dry and intact without evidence of skin break down, no evidence of skin tears noted. IV catheter discontinued intact. Site without signs and symptoms of complications. Dressing and pressure applied.  An After Visit Summary was printed and given to the patient. Patient received prescription.  D/c education completed with patient/family including follow up instructions, medication list, d/c activities limitations if indicated, with other d/c instructions as indicated by MD - patient able to verbalize understanding, all questions fully answered.   Patient instructed to return to ED, call 911, or call MD for any changes in condition.   Patient is waiting on his wive to come pick him up. Will continue to assess.  Alexander Farley 06/30/2017 2:01 PM

## 2017-07-03 LAB — CRYOGLOBULIN

## 2017-07-05 ENCOUNTER — Other Ambulatory Visit: Payer: Self-pay | Admitting: Gastroenterology

## 2017-07-05 DIAGNOSIS — K219 Gastro-esophageal reflux disease without esophagitis: Secondary | ICD-10-CM

## 2017-07-05 DIAGNOSIS — R066 Hiccough: Secondary | ICD-10-CM

## 2017-07-06 ENCOUNTER — Ambulatory Visit
Admission: RE | Admit: 2017-07-06 | Discharge: 2017-07-06 | Disposition: A | Discharge status: Home / Self Care | Payer: Medicare Other | Source: Ambulatory Visit | Attending: Gastroenterology | Admitting: Gastroenterology

## 2017-07-06 ENCOUNTER — Other Ambulatory Visit: Payer: Self-pay | Admitting: Gastroenterology

## 2017-07-06 DIAGNOSIS — R066 Hiccough: Secondary | ICD-10-CM

## 2017-07-06 DIAGNOSIS — K219 Gastro-esophageal reflux disease without esophagitis: Secondary | ICD-10-CM

## 2017-07-06 DIAGNOSIS — R0602 Shortness of breath: Secondary | ICD-10-CM

## 2017-07-07 ENCOUNTER — Telehealth: Payer: Self-pay | Admitting: Cardiovascular Disease

## 2017-07-07 NOTE — Telephone Encounter (Signed)
Received call directly from operator from patient who states he is gasping for breath when he has the hiccups. He states he is having trouble breathing at times when he hiccups. He was hospitalized recently for vasculitis and states he went to see his GI doctor this morning. He states he did a swallow study and was advised that there are no upper GI obstructions. He was advised to take valium to help with the hiccups. I asked if he is having any chest pain or other symptoms that concern him; he denies. I advised patient to follow his GI doctor's advice and to make certain he is eating, drinking, and swallowing pills slowly. I advised him to try to relax when the hiccups occur so that he does not put additional stress on his body. He verbalized understanding and agreement and thanked me for the call.

## 2017-07-07 NOTE — Telephone Encounter (Signed)
Pt c/o medication issue:  1. Name of Medication: Patient not sure which medication is causing issues  2. How are you currently taking this medication (dosage and times per day)?n/a  3. Are you having a reaction (difficulty breathing--STAT)? Yes, has hiccups and the hiccups cause him to gasp for breath  4. What is your medication issue? Medication causing issues breathing and cause to feel fatigue

## 2017-07-14 NOTE — Discharge Summary (Signed)
Physician Discharge Summary  Alexander Farley TGG:[id number] DOB: 09-Oct-1932 DOA: 06/26/2017  PCP: [name], MD  Admit date: 06/26/2017 Discharge date: 07/14/2017  Admitted From: home Disposition:  home   Recommendations for Outpatient Follow-up:  1. 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  CABG 2011     HTN (hypertension)  GERD   Subjective: No nausea, vomiting or abdominal pain. No diarrhea or constipation. No heartburn.   Brief Summary: Alexander Farley is a 83 y.o.malewith medical history significant of CAD. Patient presents to the ED with c/o ongoing abd pain over past several weeks. 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 Started on prednisone 20mg  BID by Dr. Michail Sermon however this was not enough to control symptoms. He was admitted for IV antibiotics.    Hospital Course:  Principal Problem:   Vasculitis of mesenteric artery  - cont steroids and diet per GI- have tapered to Prednisone and he is now tolerating soft food  - GI cocktail did help resolve burning with meals - cont Pepcid and Protonix - recommended to stay on 60 mg of Prednisone until he follows up with GI in the office in late Novemeber  Active Problems:   CAD (coronary artery disease), S/p stenting of his ostial LAD- 2006.  CABG 2011     HTN (hypertension) -Continue aspirin, statin, beta-blocker, lisinopril  GERD- small hiatal hernia (see imaging below) - cont Pepcid and Protonix  BPH - cont Flomax    Discharge Instructions  Discharge Instructions    Diet - low sodium heart healthy   Complete by:  As directed    Increase activity slowly   Complete by:  As directed      Allergies as of 06/30/2017      Reactions   Zocor [simvastatin]  Other (See Comments)   HYPOTENSION      Medication List    STOP taking these medications   naproxen sodium 220 MG tablet Commonly known as:  ALEVE     TAKE these medications   acetaminophen 325 MG tablet Commonly known as:  TYLENOL Take 2 tablets (650 mg total) by mouth every 4 (four) hours as needed for headache or mild pain.   aspirin 81 MG tablet Take 81 mg by mouth daily.   atorvastatin 80 MG tablet Commonly known as:  LIPITOR TAKE 1 TABLET (80 MG TOTAL) BY MOUTH DAILY.   B-12 1000 MCG Tbcr Take 500 mcg by mouth 2 (two) times daily.   carvedilol 6.25 MG tablet Commonly known as:  COREG Take 1 tablet (6.25 mg total) by mouth 2 (two) times daily.   famotidine 20 MG tablet Commonly known as:  PEPCID Take 1 tablet (20 mg total) by mouth 2 (two) times daily.   feeding supplement Liqd Take 1 Container by mouth 3 (three) times daily between meals.   folic acid 1 MG tablet Commonly known as:  FOLVITE Take 1 mg by mouth daily.   lisinopril 10 MG tablet Commonly known as:  PRINIVIL,ZESTRIL Take 1 tablet (10 mg total) by mouth daily.   LORazepam 1 MG tablet Commonly known as:  ATIVAN Take 0.5-1 tablets by mouth 2 (two) times daily as needed for anxiety.   multivitamin capsule Take 1 capsule by mouth  daily.   nitroGLYCERIN 0.4 MG SL tablet Commonly known as:  NITROSTAT Place 1 tablet (0.4 mg total) under the tongue every 5 (five) minutes as needed for chest pain.   pantoprazole 20 MG tablet Commonly known as:  PROTONIX Take 1 tablet (20 mg total) by mouth daily.   potassium chloride 10 MEQ tablet Commonly known as:  K-DUR,KLOR-CON Take 1 tablet (10 mEq total) by mouth daily.   predniSONE 20 MG tablet Commonly known as:  DELTASONE Take 3 tablets (60 mg total) by mouth daily with breakfast. What changed:    how much to take  when to take this   SYSTANE ULTRA 0.4-0.3 % Soln Generic drug:  Polyethyl Glycol-Propyl Glycol Apply 1 drop to eye daily as  needed (irratation).   tamsulosin 0.4 MG Caps capsule Commonly known as:  FLOMAX Take 0.4 mg by mouth daily.   traMADol 50 MG tablet Commonly known as:  ULTRAM Take 1 tablet (50 mg total) by mouth every 6 (six) hours as needed for moderate pain.      Follow-up Information    Wilford Corner, MD Follow up.   Specialty:  Gastroenterology Why:  His office will call you.  Contact information: [address] [place] [address] [phone]        [name], MD. Schedule an appointment as soon as possible for a visit in 2 day(s).   Specialty:  Family Medicine Why:  please see in 2 wks to ensure you are doing ok.  Contact information: [address] [place] [address] [phone]          Allergies  Allergen Reactions  . Zocor [Simvastatin] Other (See Comments)    HYPOTENSION     Procedures/Studies:    Dg Chest 2 View  Result Date: 07/06/2017 CLINICAL DATA:  Shortness of breath, cough for 3 weeks, hiccups EXAM: CHEST  2 VIEW COMPARISON:  Chest x-ray of 05/29/2016 FINDINGS: No active infiltrate or effusion is seen. Mediastinal and hilar contours are unremarkable. Mild cardiomegaly is stable. Median sternotomy sutures are noted from prior CABG. There are mild degenerative changes in the lower thoracic spine. IMPRESSION: Stable mild cardiomegaly.  No active lung disease.  Prior CABG. Electronically Signed   By: Ivar Drape M.D.   On: 07/06/2017 11:44   Ct Abdomen Pelvis W Contrast  Result Date: 06/22/2017 CLINICAL DATA:  Abnormal weight loss. EXAM: CT ABDOMEN AND PELVIS WITH CONTRAST TECHNIQUE: Multidetector CT imaging of the abdomen and pelvis was performed using the standard protocol following bolus administration of intravenous contrast. CONTRAST:  141mL ISOVUE-300 IOPAMIDOL (ISOVUE-300) INJECTION 61% COMPARISON:  CT abdomen from 05/15/2017 FINDINGS: Lower chest: Mild nodularity along a region of scarring in the posterior basal  segment right lower lobe, measuring 0.8 by 0.5 cm on image 14/4, stable from 05/15/2017. Coronary artery atherosclerosis. Hepatobiliary: Several stable hepatic cysts noted. Gallbladder not well seen, likely contracted. Pancreas: Unremarkable Spleen: No splenic abnormality identified. Adrenals/Urinary Tract: Similar appearance of multiple tiny hypodense renal lesions likely reflecting cysts but for the most part technically nonspecific. Larger exophytic lesions of the right kidney lower pole are more clearly benign cysts. If urinary bladder unremarkable. No renal stones identified. Stomach/Bowel: Sigmoid colon diverticulosis. The appendix is not well seen. Vascular/Lymphatic: Aortoiliac atherosclerotic vascular disease. Low-density rind around the somewhat small caliber celiac trunk and proximal SMA, new compared 04/14/2016. In the affected region the caliber both vessels is reduced compared to 04/14/2016. Reproductive: The prostate measures 5.5 by 5.0 by  5.7 cm (volume = 82 cm^3). Other:  Small amount of free pelvic fluid, origin uncertain. Musculoskeletal: A transitional lumbosacral vertebra is assumed to represent the S1 level. Careful correlation with this numbering strategy prior to any procedural intervention would be recommended. Lumbar spondylosis and degenerative disc disease are believed to likely be causing impingement at L1-2, L2-3, L4-5, L5-S1, and eccentric to the right at S1-2. IMPRESSION: 1. Abnormal density surrounding the proximal celiac trunk and proximal SMA with mild reduction of the vessel caliber in this vicinity, compatible with a large vessel mesenteric vasculitis. 2. There is some mild nodularity along the region of prior scarring in the posterior basal segment right lower lobe, measuring 0.8 by 0.5 cm. Most likely this is simply some adjacent atelectasis or progressive scarring, but follow up to ensure that there is no true neoplastic pulmonary nodule is recommended. Non-contrast chest CT at  6-12 months is recommended. If the nodule is stable at time of repeat CT, then future CT at 18-24 months (from today's scan) is considered optional for low-risk patients, but is recommended for high-risk patients. This recommendation follows the consensus statement: Guidelines for Management of Incidental Pulmonary Nodules Detected on CT Images: From the Fleischner Society 2017; Radiology 2017; 284:228-243. 3. Other imaging findings of potential clinical significance: Aortic Atherosclerosis (ICD10-I70.0). Coronary atherosclerosis. Stable hepatic and renal cysts. Sigmoid diverticulosis. Prostatomegaly. Small amount of free pelvic fluid, origin uncertain, increased from 05/15/2017. Lumbar scoliosis, spondylosis, and degenerative disc disease causing some degree of multilevel impingement. Electronically Signed   By: Van Clines M.D.   On: 06/22/2017 12:26   Dg Esophagus  Result Date: 07/06/2017 CLINICAL DATA:  A cuffs, possible reflux EXAM: ESOPHOGRAM/BARIUM SWALLOW TECHNIQUE: Single contrast examination was performed using  thin barium. FLUOROSCOPY TIME:  Fluoroscopy Time:  2 minutes 6 second Radiation Exposure Index (if provided by the fluoroscopic device): 79 mGy Number of Acquired Spot Images: 0 COMPARISON:  None. FINDINGS: A single contrast barium swallow was performed. The study was begun in the lateral projection to evaluate for possible aspiration. The swallowing mechanism is unremarkable. No penetration or aspiration is seen. However there is some pooling of barium within the vallecular and piriform sinuses. Also, the lower cervical esophagus deviate slightly to the left which could indicate prominence of the right lobe of thyroid. Clinical correlation is recommended. Esophageal peristalsis is within normal limits. There is a small sliding hiatal hernia present. Only faint gastroesophageal reflux is demonstrated. A barium pill was given at the end of the study which did pass into the stomach without  delay. IMPRESSION: 1. Small hiatal hernia with faint gastroesophageal reflux. 2. Barium pill passes into the stomach without delay. 3. Slight deviation of the lower cervical esophagus to the left may indicate enlargement of the right lobe of thyroid. Correlate clinically. 4. Pooling of barium in the valleculae and piriform sinuses. No penetration or aspiration. Electronically Signed   By: Ivar Drape M.D.   On: 07/06/2017 11:42   Ct Angio Abd/pel W And/or Wo Contrast  Result Date: 06/26/2017 CLINICAL DATA:  83 year old male with abdominal pain and loss of appetite. Concern for mesenteric ischemia. EXAM: CTA ABDOMEN AND PELVIS wITHOUT AND WITH CONTRAST TECHNIQUE: Multidetector CT imaging of the abdomen and pelvis was performed using the standard protocol during bolus administration of intravenous contrast. Multiplanar reconstructed images and MIPs were obtained and reviewed to evaluate the vascular anatomy. CONTRAST:  100 cc Isovue 370 COMPARISON:  Abdominal CT dated 06/22/2017 FINDINGS: VASCULAR Aorta: There is moderate calcified and noncalcified  plaque along the course of the aorta. The aorta is mildly tortuous and ectatic measuring up to 2.5 cm in diameter. There is noncalcified mural plaque in the periphery of the distal abdominal aorta without significant luminal narrowing. There is no aortic dissection. Celiac: The celiac axis is patent. There is ill-defined circumferential thickening of the origin of the celiac axis as seen on the prior CT likely representing inflammatory changes and vasculitis. SMA: Ill-defined circumferential soft tissue density involving the SMA likely representing inflammatory changes and vasculitis. The SMA remains patent however demonstrates a slightly irregular and beaded appearance. There is a replaced right hepatic artery arising from the SMA. Renals: The renal arteries are patent. No aneurysmal dilatation or evidence of dissection. IMA: There is atherosclerotic calcification of  the origin of the IMA. The IMA however remains patent. Inflow: There is advanced atherosclerotic calcification of the iliac arteries primarily involving the internal iliacs. 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 The common iliac arteries , and external iliac arteries are widely patent without aneurysmal dilatation or dissection. The internal iliac arteries also remain patent despite advanced atherosclerotic disease. Proximal Outflow: Bilateral common femoral and visualized portions of the superficial and profunda femoral arteries are patent without evidence of aneurysm, dissection, vasculitis or significant stenosis. Veins: No obvious venous abnormality within the limitations of this arterial phase study. Review of the MIP images confirms the above findings. NON-VASCULAR Lower chest: The visualized lung bases are clear. There is a 2 cm pneumatocele at the right lung base. Stable 8 x 5 mm nodularity in the posterior basal segment of the right lower lobe (series 13 image 19). Follow-up as recommended on the CT of 06/22/2017. There is mild cardiomegaly. There is no intra-abdominal free air. Small free fluid noted within the pelvis. Hepatobiliary: Multiple scattered hypodense lesions within the liver most consistent with cysts. The gallbladder is predominantly contracted and appears grossly unremarkable. Pancreas: Unremarkable. No pancreatic ductal dilatation or surrounding inflammatory changes. Spleen: Normal in size without focal abnormality. Adrenals/Urinary Tract: The adrenal glands are unremarkable. There is a 4.5 cm right renal inferior pole cyst as well as scattered subcentimeter hypodensities which are too small to characterize. There is no hydronephrosis on either side. The urinary bladder is grossly unremarkable. Stomach/Bowel: There is sigmoid diverticulosis. There is mild thickened appearance of the sigmoid colon, likely related to  underdistention. Colitis is less likely but not entirely excluded. Clinical correlation is recommended. Mildly thickened appearance of the wall of the stomach also likely secondary to underdistention. Correlation with clinical exam is recommended to exclude gastritis. The there is no bowel obstruction. No bowel pneumatosis. The appendix is unremarkable. Lymphatic: No adenopathy. Reproductive: The prostate gland is enlarged and heterogeneous measuring approximately 6 cm in transverse diameter. Other: There is a right inguinal hernia containing small amount of fluid. The right testicle appears to be somewhat high-riding and within the right inguinal canal. Correlation with clinical exam recommended. Musculoskeletal: There is osteopenia with scoliosis and degenerative changes of the spine. No acute osseous pathology. IMPRESSION: VASCULAR 1. No CT evidence of mesenteric ischemia as clinically concerned. No pneumatosis or portal venous gas. 2. Moderate atherosclerotic disease of the aorta with calcified and noncalcified plaque. The aorta is ectatic measuring up to 2.5 cm in diameter. No aneurysmal dilatation or evidence of dissection. 3. Circumferential soft tissue density surrounding the origins of the celiac trunk and SMA as seen on the prior CT most likely representing inflammatory  changes and vasculitis. The origins of the celiac axis, SMA, and IMA however remain patent. 4. Sigmoid diverticulosis without definite evidence of acute diverticulitis. Thickened appearance of the sigmoid colon may be related to underdistention. Correlation with clinical exam is recommended to exclude colitis. 5.  Aortic Atherosclerosis (ICD10-I70.0). 6. Other nonemergent findings including right posterior basal subpleural nodule similar to prior CT, enlarged prostate gland, liver cysts, scoliosis and degenerative changes of the spine, and renal cysts. NON-VASCULAR Electronically Signed   By: Anner Crete M.D.   On: 06/26/2017 18:54    US Abdomen Limited Ruq  Result Date: 06/20/2017 CLINICAL DATA:  Abdominal pain . EXAM: ULTRASOUND ABDOMEN LIMITED RIGHT UPPER QUADRANT COMPARISON:  CT 05/15/2017 . FINDINGS: Gallbladder: No gallstones or wall thickening visualized. No sonographic Murphy sign noted by sonographer. Common bile duct: Diameter: 5.1 mm Liver: Stable 2.0 cm simple cyst right hepatic lobe. Portal vein is patent on color Doppler imaging with normal direction of blood flow towards the liver. IMPRESSION: 1.  No acute abnormality identified. 2.  Stable simple cyst right hepatic lobe. Electronically Signed   By: Marcello Moores  Register   On: 06/20/2017 10:49        Discharge Exam: Vitals:   06/30/17 0910 06/30/17 1317  BP: 123/84 (!) 155/95  Pulse:  67  Resp:    Temp:  98.1 F (36.7 C)  SpO2:  100%   Vitals:   06/29/17 2049 06/30/17 0500 06/30/17 0910 06/30/17 1317  BP: (!) 151/93 132/76 123/84 (!) 155/95  Pulse: 61 (!) 52  67  Resp: 14 16    Temp: 98.4 F (36.9 C) 98.2 F (36.8 C)  98.1 F (36.7 C)  TempSrc: Oral Oral  Oral  SpO2: 100% 100%  100%  Weight:      Height:        General: Pt is alert, awake, not in acute distress Cardiovascular: RRR, S1/S2 +, no rubs, no gallops Respiratory: CTA bilaterally, no wheezing, no rhonchi Abdominal: Soft, NT, ND, bowel sounds + Extremities: no edema, no cyanosis    The results of significant diagnostics from this hospitalization (including imaging, microbiology, ancillary and laboratory) are listed below for reference.     Microbiology: No results found for this or any previous visit (from the past 240 hour(s)).   Labs: BNP (last 3 results) No results for input(s): BNP in the last 8760 hours. Basic Metabolic Panel: No results for input(s): NA, K, CL, CO2, GLUCOSE, BUN, CREATININE, CALCIUM, MG, PHOS in the last 168 hours. Liver Function Tests: No results for input(s): AST, ALT, ALKPHOS, BILITOT, PROT, ALBUMIN in the last 168 hours. No results for  input(s): LIPASE, AMYLASE in the last 168 hours. No results for input(s): AMMONIA in the last 168 hours. CBC: No results for input(s): WBC, NEUTROABS, HGB, HCT, MCV, PLT in the last 168 hours. Cardiac Enzymes: No results for input(s): CKTOTAL, CKMB, CKMBINDEX, TROPONINI in the last 168 hours. BNP: Invalid input(s): POCBNP CBG: No results for input(s): GLUCAP in the last 168 hours. D-Dimer No results for input(s): DDIMER in the last 72 hours. Hgb A1c No results for input(s): HGBA1C in the last 72 hours. Lipid Profile No results for input(s): CHOL, HDL, LDLCALC, TRIG, CHOLHDL, LDLDIRECT in the last 72 hours. Thyroid function studies No results for input(s): TSH, T4TOTAL, T3FREE, THYROIDAB in the last 72 hours.  Invalid input(s): FREET3 Anemia work up No results for input(s): VITAMINB12, FOLATE, FERRITIN, TIBC, IRON, RETICCTPCT in the last 72 hours. Urinalysis    Component Value Date/Time  COLORURINE AMBER (A) 06/26/2017 1150   APPEARANCEUR HAZY (A) 06/26/2017 1150   LABSPEC 1.027 06/26/2017 1150   PHURINE 5.0 06/26/2017 1150   GLUCOSEU NEGATIVE 06/26/2017 1150   HGBUR NEGATIVE 06/26/2017 [place] 06/26/2017 1150   KETONESUR NEGATIVE 06/26/2017 1150   PROTEINUR NEGATIVE 06/26/2017 1150   UROBILINOGEN 1.0 11/16/2013 0405   NITRITE NEGATIVE 06/26/2017 [place] 06/26/2017 1150   Sepsis Labs Invalid input(s): PROCALCITONIN,  WBC,  LACTICIDVEN Microbiology No results found for this or any previous visit (from the past 240 hour(s)).   Time coordinating discharge: Over 30 minutes  SIGNED:   Debbe Odea, MD  [facility] 07/14/2017, 2:45 PM Pager   If 7PM-7AM, please contact night-coverage [email or web address] Password [email or web address]

## 2017-07-18 ENCOUNTER — Telehealth: Payer: Self-pay | Admitting: Cardiovascular Disease

## 2017-07-18 NOTE — Telephone Encounter (Signed)
Alexander Farley is calling because Dr.Hawks ( Rheumatologist ) said that he had a irregular heartbeat and Alexander Farley is wanting to speak with you about it . Please call  Thanks mic

## 2017-07-18 NOTE — Telephone Encounter (Signed)
Spoke with patient who states he went to [place] for f/u on vasculitis and Dr. Trudie Reed told him he is having irregular heart beat. Patient has a hx of PAF. He denies symptoms of worsening dizziness, SOB or chest discomfort. He states he has been having abdominal discomfort recently and is being treated for vasculitis. I asked if he has noticed that his heart rate is irregular more often and he states he cannot say for sure. I scheduled him to see Dr. Acie Fredrickson tomorrow, 11/21. He verbalized understanding and agreement with the plan and thanked me for the call.

## 2017-07-19 ENCOUNTER — Encounter: Payer: Self-pay | Admitting: Cardiovascular Disease

## 2017-07-19 ENCOUNTER — Ambulatory Visit: Payer: Medicare Other | Admitting: Cardiovascular Disease

## 2017-07-19 VITALS — BP 90/62 | HR 115 | Ht 69.0 in | Wt 136.0 lb

## 2017-07-19 DIAGNOSIS — I2510 Atherosclerotic heart disease of native coronary artery without angina pectoris: Secondary | ICD-10-CM

## 2017-07-19 DIAGNOSIS — I480 Paroxysmal atrial fibrillation: Secondary | ICD-10-CM

## 2017-07-19 MED ORDER — METOPROLOL TARTRATE 25 MG PO TABS: 25.0000 mg | ORAL_TABLET | Freq: Two times a day (BID) | ORAL | 3 refills | Status: DC

## 2017-07-19 MED ORDER — APIXABAN 5 MG PO TABS: 5.0000 mg | ORAL_TABLET | Freq: Two times a day (BID) | ORAL | 11 refills | Status: DC

## 2017-07-19 NOTE — Addendum Note (Signed)
Addended by: Emmaline Life on: 07/19/2017 03:27 PM   Modules accepted: Orders
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
## 2017-07-19 NOTE — Patient Instructions (Addendum)
Medication Instructions:  Your physician has recommended you make the following change in your medication: STOP Carvedilol (Coreg) START Metoprolol (Lopressor) 25 mg twice daily  **We will call you about restarting the Eliquis after Dr. Acie Fredrickson talks to the neurologist  Labwork: TODAY - cholesterol, liver panel, basic metabolic panel   Testing/Procedures: None Ordered   Follow-Up: Your physician has requested that you regularly monitor and record your blood pressure readings at home. Please use the same machine at the same time of day to check your readings and record them and call Sharyn Lull to report.   Your physician recommends that you schedule a follow-up appointment in: 4-6 weeks with Dr. Acie Fredrickson or APP   If you need a refill on your cardiac medications before your next appointment, please call your pharmacy.   Thank you for choosing [facility]! Christen Bame, RN [phone]

## 2017-07-20 ENCOUNTER — Other Ambulatory Visit: Payer: Self-pay | Admitting: Cardiovascular Disease

## 2017-07-20 LAB — HEPATIC FUNCTION PANEL
ALBUMIN: 3.5 g/dL (ref 3.5–4.7)
ALT: 87 IU/L — ABNORMAL HIGH (ref 0–44)
AST: 41 IU/L — ABNORMAL HIGH (ref 0–40)
Alkaline Phosphatase: 103 IU/L (ref 39–117)
BILIRUBIN TOTAL: 0.7 mg/dL (ref 0.0–1.2)
Bilirubin, Direct: 0.18 mg/dL (ref 0.00–0.40)
Total Protein: 5.9 g/dL — ABNORMAL LOW (ref 6.0–8.5)

## 2017-07-20 LAB — BASIC METABOLIC PANEL
BUN/Creatinine Ratio: 20 (ref 10–24)
BUN: 21 mg/dL (ref 8–27)
CALCIUM: 8.6 mg/dL (ref 8.6–10.2)
CHLORIDE: 102 mmol/L (ref 96–106)
CO2: 25 mmol/L (ref 20–29)
Creatinine, Ser: 1.05 mg/dL (ref 0.76–1.27)
GFR calc Af Amer: 76 mL/min/{1.73_m2} (ref >59)
GFR calc non Af Amer: 66 mL/min/{1.73_m2} (ref >59)
GLUCOSE: 99 mg/dL (ref 65–99)
POTASSIUM: 4.4 mmol/L (ref 3.5–5.2)
Sodium: 141 mmol/L (ref 134–144)

## 2017-07-20 LAB — LIPID PANEL
CHOL/HDL RATIO: 3.9 ratio (ref 0.0–5.0)
Cholesterol, Total: 220 mg/dL — ABNORMAL HIGH (ref 100–199)
HDL: 56 mg/dL (ref >39)
LDL CALC: 144 mg/dL — AB (ref 0–99)
Triglycerides: 98 mg/dL (ref 0–149)
VLDL Cholesterol Cal: 20 mg/dL (ref 5–40)

## 2017-07-22 ENCOUNTER — Other Ambulatory Visit: Payer: Self-pay | Admitting: Cardiovascular Disease

## 2017-07-24 ENCOUNTER — Telehealth: Payer: Self-pay | Admitting: Nurse Practitioner

## 2017-07-24 DIAGNOSIS — E782 Mixed hyperlipidemia: Secondary | ICD-10-CM

## 2017-07-24 MED ORDER — ROSUVASTATIN CALCIUM 20 MG PO TABS: 20.0000 mg | ORAL_TABLET | Freq: Every day | ORAL | 3 refills | Status: DC

## 2017-07-24 MED ORDER — LISINOPRIL 10 MG PO TABS: 10.0000 mg | ORAL_TABLET | Freq: Every day | ORAL | 3 refills | Status: DC

## 2017-07-24 NOTE — Telephone Encounter (Signed)
-----   Message from Thayer Headings, MD sent at 07/23/2017  8:27 PM EST ----- Intol to Simva ?  Is he still taking the Atorva? If not , please have him restart and recheck labs in 3 months If he is taking Atorva,  Please DC and start Crestor 20 mg a day, Recheck labs in 3 months

## 2017-07-24 NOTE — Addendum Note (Signed)
Addended by: Emmaline Life on: 07/24/2017 12:31 PM   Modules accepted: Orders

## 2017-07-24 NOTE — Telephone Encounter (Signed)
Reviewed results and plan of care with patient who states he is willing to take rosuvastatin 20 mg. He asked about cost. I advised him that I will send a Rx to his pharmacy and if it is too expensive to call me back to report. I scheduled 3 month lab appointment for 2/27 and advised we will cancel this if he is unable to afford rosuvastatin. He verbalized understanding and agreement with plan and thanked me for the call.

## 2017-07-24 NOTE — Addendum Note (Signed)
Addended by: Derl Barrow on: 07/24/2017 10:06 AM   Modules accepted: Orders

## 2017-08-02 ENCOUNTER — Ambulatory Visit: Payer: Medicare Other | Admitting: Cardiovascular Disease

## 2017-08-04 ENCOUNTER — Encounter: Payer: Self-pay | Admitting: Cardiology

## 2017-08-07 ENCOUNTER — Other Ambulatory Visit: Payer: Self-pay

## 2017-08-07 ENCOUNTER — Emergency Department (HOSPITAL_COMMUNITY): Payer: Medicare Other

## 2017-08-07 ENCOUNTER — Encounter (HOSPITAL_COMMUNITY): Payer: Self-pay | Admitting: Internal Medicine

## 2017-08-07 ENCOUNTER — Inpatient Hospital Stay (HOSPITAL_COMMUNITY)
Admission: EM | Admit: 2017-08-07 | Discharge: 2017-08-20 | DRG: 987 | Disposition: A | Discharge status: Hospice | Payer: Medicare Other | Attending: Internal Medicine | Admitting: Internal Medicine

## 2017-08-07 DIAGNOSIS — Z9889 Other specified postprocedural states: Secondary | ICD-10-CM

## 2017-08-07 DIAGNOSIS — D62 Acute posthemorrhagic anemia: Secondary | ICD-10-CM | POA: Diagnosis not present

## 2017-08-07 DIAGNOSIS — Z7901 Long term (current) use of anticoagulants: Secondary | ICD-10-CM

## 2017-08-07 DIAGNOSIS — F419 Anxiety disorder, unspecified: Secondary | ICD-10-CM | POA: Diagnosis present

## 2017-08-07 DIAGNOSIS — K838 Other specified diseases of biliary tract: Secondary | ICD-10-CM | POA: Diagnosis not present

## 2017-08-07 DIAGNOSIS — K5900 Constipation, unspecified: Secondary | ICD-10-CM | POA: Diagnosis present

## 2017-08-07 DIAGNOSIS — Z66 Do not resuscitate: Secondary | ICD-10-CM | POA: Diagnosis not present

## 2017-08-07 DIAGNOSIS — N131 Hydronephrosis with ureteral stricture, not elsewhere classified: Secondary | ICD-10-CM | POA: Diagnosis present

## 2017-08-07 DIAGNOSIS — E43 Unspecified severe protein-calorie malnutrition: Secondary | ICD-10-CM | POA: Diagnosis present

## 2017-08-07 DIAGNOSIS — Z79899 Other long term (current) drug therapy: Secondary | ICD-10-CM

## 2017-08-07 DIAGNOSIS — Z79891 Long term (current) use of opiate analgesic: Secondary | ICD-10-CM

## 2017-08-07 DIAGNOSIS — Z823 Family history of stroke: Secondary | ICD-10-CM

## 2017-08-07 DIAGNOSIS — C250 Malignant neoplasm of head of pancreas: Principal | ICD-10-CM

## 2017-08-07 DIAGNOSIS — R197 Diarrhea, unspecified: Secondary | ICD-10-CM | POA: Diagnosis not present

## 2017-08-07 DIAGNOSIS — R531 Weakness: Secondary | ICD-10-CM | POA: Diagnosis not present

## 2017-08-07 DIAGNOSIS — B370 Candidal stomatitis: Secondary | ICD-10-CM | POA: Diagnosis not present

## 2017-08-07 DIAGNOSIS — N179 Acute kidney failure, unspecified: Secondary | ICD-10-CM | POA: Diagnosis present

## 2017-08-07 DIAGNOSIS — M6284 Sarcopenia: Secondary | ICD-10-CM | POA: Diagnosis present

## 2017-08-07 DIAGNOSIS — K222 Esophageal obstruction: Secondary | ICD-10-CM | POA: Diagnosis present

## 2017-08-07 DIAGNOSIS — Z87891 Personal history of nicotine dependence: Secondary | ICD-10-CM

## 2017-08-07 DIAGNOSIS — R945 Abnormal results of liver function studies: Secondary | ICD-10-CM | POA: Diagnosis not present

## 2017-08-07 DIAGNOSIS — I776 Arteritis, unspecified: Secondary | ICD-10-CM | POA: Diagnosis present

## 2017-08-07 DIAGNOSIS — H9319 Tinnitus, unspecified ear: Secondary | ICD-10-CM | POA: Diagnosis present

## 2017-08-07 DIAGNOSIS — K7200 Acute and subacute hepatic failure without coma: Secondary | ICD-10-CM | POA: Diagnosis present

## 2017-08-07 DIAGNOSIS — I2510 Atherosclerotic heart disease of native coronary artery without angina pectoris: Secondary | ICD-10-CM | POA: Diagnosis present

## 2017-08-07 DIAGNOSIS — N400 Enlarged prostate without lower urinary tract symptoms: Secondary | ICD-10-CM | POA: Diagnosis present

## 2017-08-07 DIAGNOSIS — K8689 Other specified diseases of pancreas: Secondary | ICD-10-CM

## 2017-08-07 DIAGNOSIS — Z951 Presence of aortocoronary bypass graft: Secondary | ICD-10-CM

## 2017-08-07 DIAGNOSIS — I129 Hypertensive chronic kidney disease with stage 1 through stage 4 chronic kidney disease, or unspecified chronic kidney disease: Secondary | ICD-10-CM | POA: Diagnosis present

## 2017-08-07 DIAGNOSIS — Z955 Presence of coronary angioplasty implant and graft: Secondary | ICD-10-CM

## 2017-08-07 DIAGNOSIS — I4510 Unspecified right bundle-branch block: Secondary | ICD-10-CM | POA: Diagnosis present

## 2017-08-07 DIAGNOSIS — R748 Abnormal levels of other serum enzymes: Secondary | ICD-10-CM | POA: Diagnosis not present

## 2017-08-07 DIAGNOSIS — G893 Neoplasm related pain (acute) (chronic): Secondary | ICD-10-CM | POA: Diagnosis not present

## 2017-08-07 DIAGNOSIS — Z888 Allergy status to other drugs, medicaments and biological substances status: Secondary | ICD-10-CM

## 2017-08-07 DIAGNOSIS — N182 Chronic kidney disease, stage 2 (mild): Secondary | ICD-10-CM | POA: Diagnosis present

## 2017-08-07 DIAGNOSIS — I480 Paroxysmal atrial fibrillation: Secondary | ICD-10-CM | POA: Diagnosis present

## 2017-08-07 DIAGNOSIS — Z8619 Personal history of other infectious and parasitic diseases: Secondary | ICD-10-CM

## 2017-08-07 DIAGNOSIS — I10 Essential (primary) hypertension: Secondary | ICD-10-CM | POA: Diagnosis not present

## 2017-08-07 DIAGNOSIS — R109 Unspecified abdominal pain: Secondary | ICD-10-CM | POA: Diagnosis not present

## 2017-08-07 DIAGNOSIS — R627 Adult failure to thrive: Secondary | ICD-10-CM | POA: Diagnosis present

## 2017-08-07 DIAGNOSIS — N529 Male erectile dysfunction, unspecified: Secondary | ICD-10-CM | POA: Diagnosis present

## 2017-08-07 DIAGNOSIS — Z792 Long term (current) use of antibiotics: Secondary | ICD-10-CM

## 2017-08-07 DIAGNOSIS — E876 Hypokalemia: Secondary | ICD-10-CM | POA: Diagnosis present

## 2017-08-07 DIAGNOSIS — E785 Hyperlipidemia, unspecified: Secondary | ICD-10-CM | POA: Diagnosis present

## 2017-08-07 DIAGNOSIS — Z7189 Other specified counseling: Secondary | ICD-10-CM

## 2017-08-07 DIAGNOSIS — R600 Localized edema: Secondary | ICD-10-CM | POA: Diagnosis not present

## 2017-08-07 DIAGNOSIS — Z515 Encounter for palliative care: Secondary | ICD-10-CM

## 2017-08-07 DIAGNOSIS — K869 Disease of pancreas, unspecified: Secondary | ICD-10-CM | POA: Diagnosis present

## 2017-08-07 DIAGNOSIS — I482 Chronic atrial fibrillation: Secondary | ICD-10-CM | POA: Diagnosis present

## 2017-08-07 DIAGNOSIS — M549 Dorsalgia, unspecified: Secondary | ICD-10-CM | POA: Diagnosis present

## 2017-08-07 DIAGNOSIS — R55 Syncope and collapse: Secondary | ICD-10-CM

## 2017-08-07 DIAGNOSIS — R7989 Other specified abnormal findings of blood chemistry: Secondary | ICD-10-CM

## 2017-08-07 DIAGNOSIS — K831 Obstruction of bile duct: Secondary | ICD-10-CM

## 2017-08-07 HISTORY — DX: Malignant neoplasm of head of pancreas: C25.0

## 2017-08-07 LAB — URINALYSIS, ROUTINE W REFLEX MICROSCOPIC
Bilirubin Urine: NEGATIVE
Glucose, UA: NEGATIVE mg/dL
HGB URINE DIPSTICK: NEGATIVE
Ketones, ur: NEGATIVE mg/dL
LEUKOCYTES UA: NEGATIVE
NITRITE: NEGATIVE
PROTEIN: NEGATIVE mg/dL
SPECIFIC GRAVITY, URINE: 1.015 (ref 1.005–1.030)
pH: 5 (ref 5.0–8.0)

## 2017-08-07 LAB — COMPREHENSIVE METABOLIC PANEL
ALBUMIN: 3.6 g/dL (ref 3.5–5.0)
ALT: 1228 U/L — ABNORMAL HIGH (ref 17–63)
ANION GAP: 8 (ref 5–15)
AST: 536 U/L — ABNORMAL HIGH (ref 15–41)
Alkaline Phosphatase: 601 U/L — ABNORMAL HIGH (ref 38–126)
BUN: 28 mg/dL — ABNORMAL HIGH (ref 6–20)
CHLORIDE: 100 mmol/L — AB (ref 101–111)
CO2: 27 mmol/L (ref 22–32)
Calcium: 9.2 mg/dL (ref 8.9–10.3)
Creatinine, Ser: 1.37 mg/dL — ABNORMAL HIGH (ref 0.61–1.24)
GFR calc non Af Amer: 46 mL/min — ABNORMAL LOW (ref >60)
GFR, EST AFRICAN AMERICAN: 54 mL/min — AB (ref >60)
GLUCOSE: 101 mg/dL — AB (ref 65–99)
POTASSIUM: 4.8 mmol/L (ref 3.5–5.1)
SODIUM: 135 mmol/L (ref 135–145)
Total Bilirubin: 3.7 mg/dL — ABNORMAL HIGH (ref 0.3–1.2)
Total Protein: 6.9 g/dL (ref 6.5–8.1)

## 2017-08-07 LAB — CBC WITH DIFFERENTIAL/PLATELET
BASOS PCT: 0 %
Basophils Absolute: 0 10*3/uL (ref 0.0–0.1)
Eosinophils Absolute: 0 10*3/uL (ref 0.0–0.7)
Eosinophils Relative: 0 %
HEMATOCRIT: 45.4 % (ref 39.0–52.0)
HEMOGLOBIN: 15.1 g/dL (ref 13.0–17.0)
LYMPHS PCT: 29 %
Lymphs Abs: 1.9 10*3/uL (ref 0.7–4.0)
MCH: 29.7 pg (ref 26.0–34.0)
MCHC: 33.3 g/dL (ref 30.0–36.0)
MCV: 89.4 fL (ref 78.0–100.0)
MONO ABS: 0.5 10*3/uL (ref 0.1–1.0)
MONOS PCT: 8 %
NEUTROS ABS: 4.1 10*3/uL (ref 1.7–7.7)
NEUTROS PCT: 63 %
Platelets: 189 10*3/uL (ref 150–400)
RBC: 5.08 MIL/uL (ref 4.22–5.81)
RDW: 18.8 % — AB (ref 11.5–15.5)
WBC: 6.6 10*3/uL (ref 4.0–10.5)

## 2017-08-07 LAB — LIPASE, BLOOD: Lipase: 62 U/L — ABNORMAL HIGH (ref 11–51)

## 2017-08-07 LAB — TROPONIN I: TROPONIN I: 0.03 ng/mL — AB (ref <0.03)

## 2017-08-07 MED ORDER — FENTANYL CITRATE (PF) 100 MCG/2ML IJ SOLN
50.0000 ug | Freq: Once | INTRAMUSCULAR | Status: AC
  Administered 2017-08-07: 50 ug via INTRAVENOUS
  Filled 2017-08-07: qty 2

## 2017-08-07 MED ORDER — SODIUM CHLORIDE 0.9 % IV BOLUS (SEPSIS)
1000.0000 mL | Freq: Once | INTRAVENOUS | Status: AC
  Administered 2017-08-07: 1000 mL via INTRAVENOUS

## 2017-08-07 MED ORDER — IOPAMIDOL (ISOVUE-300) INJECTION 61%
INTRAVENOUS | Status: AC
  Filled 2017-08-07: qty 100

## 2017-08-07 MED ORDER — TRAMADOL HCL 50 MG PO TABS
50.0000 mg | ORAL_TABLET | Freq: Four times a day (QID) | ORAL | Status: DC | PRN
  Administered 2017-08-07 – 2017-08-09 (×5): 50 mg via ORAL
  Filled 2017-08-07 (×5): qty 1

## 2017-08-07 MED ORDER — SODIUM CHLORIDE 0.9 % IV SOLN
INTRAVENOUS | Status: DC
  Administered 2017-08-07 – 2017-08-14 (×11): via INTRAVENOUS

## 2017-08-07 MED ORDER — GADOBENATE DIMEGLUMINE 529 MG/ML IV SOLN
15.0000 mL | Freq: Once | INTRAVENOUS | Status: AC | PRN
  Administered 2017-08-07: 12 mL via INTRAVENOUS

## 2017-08-07 MED ORDER — IOPAMIDOL (ISOVUE-300) INJECTION 61%
100.0000 mL | Freq: Once | INTRAVENOUS | Status: AC | PRN
  Administered 2017-08-07: 100 mL via INTRAVENOUS

## 2017-08-07 NOTE — ED Notes (Addendum)
Patient transported to CT 

## 2017-08-07 NOTE — H&P (Signed)
History and Physical    SHAYDON LEASE ZOX:[id number] DOB: 27-Apr-1933 DOA: 08/07/2017  PCP: [name], MD  Patient coming from: Home  Chief Complaint: Weakness, diarrhea  HPI: IZAIHA LO is a 83 y.o. male with medical history significant of CAD, anxiety, hypertension, paroxysmal atrial fibrillation on chronic anticoagulation who presents emergency department with complaints of increased weakness, anorexia, 20 pound weight loss the past 2 months.  Patient's wife reports of the 20 pounds, 10 pounds were lost in the past 2 weeks.  In further questioning, patient does report epigastric pain noted to be sharp and severe, radiating to the back.  Patient also noted nausea.  ED Course: In the emergency department, patient was noted to have alkaline phosphatase of 601 with lipase of 62, AST of 536, ALT of 1228, total bilirubin of 3.7.  The patient underwent CT abdomen pelvis with findings worrisome for interval development of biliary dilatation with increase in caliber of the pancreatic duct.  Underlying pancreatic head lesion was suspected.  Patient underwent MRCP, results pending.  Hospitalist service consulted for consideration for admission  Review of Systems:  Review of Systems  Constitutional: Positive for malaise/fatigue and weight loss. Negative for chills and fever.  HENT: Positive for ear discharge, ear pain, nosebleeds, sinus pain and tinnitus.   Eyes: Negative for blurred vision, double vision, photophobia and pain.  Respiratory: Negative for hemoptysis, sputum production and shortness of breath.   Cardiovascular: Negative for chest pain, palpitations, orthopnea and claudication.  Gastrointestinal: Positive for abdominal pain, diarrhea and nausea. Negative for vomiting.  Genitourinary: Negative for frequency, hematuria and urgency.  Musculoskeletal: Negative for back pain, joint pain and neck pain.  Neurological: Positive for weakness. Negative for tingling, tremors, seizures, loss  of consciousness and headaches.  Psychiatric/Behavioral: Negative for hallucinations, memory loss and substance abuse. The patient does not have insomnia.     Past Medical History:  Diagnosis Date  . Anxiety   . Back pain   . Carotid stenosis    Carotid US (3/15):  Bilateral ICA 1-39%  . Coronary artery disease    a.  hx stent to LAD;  b. LHC (2/11):  LAD with 70 and 80% ISR, D1 90%, CFX 30-40%, RCA 30-40% >>> CABG (L-LAD, S-OM)  . Dyslipidemia   . ED (erectile dysfunction)   . History of shingles    post herpetic neuralgia (L chest)  . Hx of cardiovascular stress test 02/2014   Normal study with no ischemia.  LVF was normal with EF 66%.  Marland Kitchen Hx of echocardiogram     Echo (10/05):  EF 55-60%  . Hypertension   . Palpitations   . Paroxysmal atrial fibrillation (HCC)   . RBBB plus LA hemiblock     Past Surgical History:  Procedure Laterality Date  . APPLICATION OF CRANIAL NAVIGATION N/A 05/19/2016   Procedure: APPLICATION OF CRANIAL NAVIGATION;  Surgeon: Eustace Moore, MD;  Location: [facility];  Service: Neurosurgery;  Laterality: N/A;  . CARDIAC CATHETERIZATION  10/12/2009   NORMAL LEFT VENTRICULAR SYSTOLIC FUNCTION. EF 60-65%  . CORONARY ANGIOPLASTY WITH STENT PLACEMENT    . CORONARY ARTERY BYPASS GRAFT     FEb. 2011  . CRANIOTOMY N/A 05/19/2016   Procedure: CRANIOTOMY HEMATOMA EVACUATION SUBDURAL with brain lab;  Surgeon: Eustace Moore, MD;  Location: [facility];  Service: Neurosurgery;  Laterality: N/A;  . EYE SURGERY  02-07-11   Right Eye     reports that he quit smoking about 32 years ago.  he has never used smokeless tobacco. He reports that he does not drink alcohol or use drugs.  Allergies  Allergen Reactions  . Zocor [Simvastatin] Other (See Comments)    HYPOTENSION    Family History  Problem Relation Age of Onset  . Stroke Mother   . Stroke Father   . Heart attack Neg Hx     Prior to Admission medications   Medication Sig Start Date End Date Taking?  Authorizing Provider  acetaminophen (TYLENOL) 325 MG tablet Take 2 tablets (650 mg total) by mouth every 4 (four) hours as needed for headache or mild pain. 06/01/16  Yes Vann, Jessica U, DO  Cyanocobalamin (B-12) 1000 MCG TBCR Take 500 mcg by mouth 2 (two) times daily.  08/15/14  Yes Nahser, Wonda Cheng, MD  diazepam (VALIUM) 10 MG tablet Take 10 mg by mouth every 6 (six) hours as needed for anxiety (hiccups).   Yes [provider]  famotidine (PEPCID) 20 MG tablet Take 1 tablet (20 mg total) by mouth 2 (two) times daily. 06/30/17  Yes Debbe Odea, MD  folic acid (FOLVITE) 1 MG tablet Take 1 mg by mouth daily.     Yes [provider]  KLOR-CON M10 10 MEQ tablet TAKE 1 TABLET BY MOUTH EVERY DAY 07/24/17  Yes Nahser, Wonda Cheng, MD  lisinopril (PRINIVIL,ZESTRIL) 10 MG tablet Take 1 tablet (10 mg total) by mouth daily. 07/24/17  Yes Nahser, Wonda Cheng, MD  LORazepam (ATIVAN) 1 MG tablet Take 0.5-1 tablets by mouth 2 (two) times daily as needed for anxiety. 03/23/17  Yes [provider]  metoprolol tartrate (LOPRESSOR) 25 MG tablet Take 1 tablet (25 mg total) by mouth 2 (two) times daily. 07/19/17 10/17/17 Yes Nahser, Wonda Cheng, MD  misoprostol (CYTOTEC) 100 MCG tablet Take 100 mcg by mouth 4 (four) times daily. 08/02/17  Yes [provider]  Multiple Vitamin (MULTIVITAMIN) capsule Take 1 capsule by mouth daily.     Yes [provider]  pantoprazole (PROTONIX) 20 MG tablet Take 1 tablet (20 mg total) by mouth daily. 06/30/17  Yes Debbe Odea, MD  rosuvastatin (CRESTOR) 20 MG tablet Take 1 tablet (20 mg total) by mouth daily. 07/24/17 10/22/17 Yes Nahser, Wonda Cheng, MD  sulfamethoxazole-trimethoprim (BACTRIM DS,SEPTRA DS) 800-160 MG tablet Take 1 tablet by mouth every other day. 07/17/17  Yes [provider]  Tamsulosin HCl (FLOMAX) 0.4 MG CAPS Take 0.4 mg by mouth daily.     Yes [provider]  traMADol (ULTRAM) 50 MG tablet Take 1 tablet (50 mg total)  by mouth every 6 (six) hours as needed for moderate pain. 06/30/17  Yes Debbe Odea, MD  apixaban (ELIQUIS) 5 MG TABS tablet Take 1 tablet (5 mg total) by mouth 2 (two) times daily. 07/19/17   Nahser, Wonda Cheng, MD  feeding supplement (BOOST / RESOURCE BREEZE) LIQD Take 1 Container by mouth 3 (three) times daily between meals. 06/30/17   Debbe Odea, MD  nitroGLYCERIN (NITROSTAT) 0.4 MG SL tablet Place 1 tablet (0.4 mg total) under the tongue every 5 (five) minutes as needed for chest pain. 09/29/16 06/26/17  Nahser, Wonda Cheng, MD  Polyethyl Glycol-Propyl Glycol (SYSTANE ULTRA) 0.4-0.3 % SOLN Apply 1 drop to eye daily as needed (irratation).    [provider]  predniSONE (DELTASONE) 20 MG tablet Take 3 tablets (60 mg total) by mouth daily with breakfast. Patient not taking: Reported on 08/07/2017 07/01/17   Debbe Odea, MD    Physical Exam: Vitals:   08/07/17  1300 08/07/17 1345 08/07/17 1358 08/07/17 1430  BP: (!) 167/86 (!) 143/85 (!) 143/85 (!) 155/80  Pulse: (!) 59 (!) 57 (!) 58 (!) 47  Resp: 17 20 15 15   SpO2: 100% 100% 100% 100%    Constitutional: NAD, calm, comfortable Vitals:   08/07/17 1300 08/07/17 1345 08/07/17 1358 08/07/17 1430  BP: (!) 167/86 (!) 143/85 (!) 143/85 (!) 155/80  Pulse: (!) 59 (!) 57 (!) 58 (!) 47  Resp: 17 20 15 15   SpO2: 100% 100% 100% 100%   Eyes: PERRL, lids and conjunctivae normal ENMT: Mucous membranes are moist. Posterior pharynx clear of any exudate or lesions.Normal dentition.  Neck: normal, supple, no masses, no thyromegaly Respiratory: clear to auscultation bilaterally, no wheezing, no crackles. Normal respiratory effort. No accessory muscle use.  Cardiovascular: Regular rate and rhythm, no murmurs / rubs / gallops. No extremity edema. 2+ pedal pulses. No carotid bruits.  Abdomen: Mildly tender, positive bowel sounds, nondistended Musculoskeletal: no clubbing / cyanosis. No joint deformity upper and lower extremities. Good ROM, no  contractures. Normal muscle tone.  Skin: no rashes, lesions, ulcers. No induration Neurologic: CN 2-12 grossly intact. Sensation intact, DTR normal. Strength 5/5 in all 4.  Psychiatric: Normal judgment and insight. Alert and oriented x 3. Normal mood.    Labs on Admission: I have personally reviewed following labs and imaging studies  CBC: Recent Labs  Lab 08/07/17 1212  WBC 6.6  NEUTROABS 4.1  HGB 15.1  HCT 45.4  MCV 89.4  PLT 703   Basic Metabolic Panel: Recent Labs  Lab 08/07/17 1212  NA 135  K 4.8  CL 100*  CO2 27  GLUCOSE 101*  BUN 28*  CREATININE 1.37*  CALCIUM 9.2   GFR: CrCl cannot be calculated (Unknown ideal weight.). Liver Function Tests: Recent Labs  Lab 08/07/17 1212  AST 536*  ALT 1,228*  ALKPHOS 601*  BILITOT 3.7*  PROT 6.9  ALBUMIN 3.6   Recent Labs  Lab 08/07/17 1212  LIPASE 62*   No results for input(s): AMMONIA in the last 168 hours. Coagulation Profile: No results for input(s): INR, PROTIME in the last 168 hours. Cardiac Enzymes: Recent Labs  Lab 08/07/17 1212  TROPONINI 0.03*   BNP (last 3 results) No results for input(s): PROBNP in the last 8760 hours. HbA1C: No results for input(s): HGBA1C in the last 72 hours. CBG: No results for input(s): GLUCAP in the last 168 hours. Lipid Profile: No results for input(s): CHOL, HDL, LDLCALC, TRIG, CHOLHDL, LDLDIRECT in the last 72 hours. Thyroid Function Tests: No results for input(s): TSH, T4TOTAL, FREET4, T3FREE, THYROIDAB in the last 72 hours. Anemia Panel: No results for input(s): VITAMINB12, FOLATE, FERRITIN, TIBC, IRON, RETICCTPCT in the last 72 hours. Urine analysis:    Component Value Date/Time   COLORURINE AMBER (A) 08/07/2017 0917   APPEARANCEUR HAZY (A) 08/07/2017 0917   LABSPEC 1.015 08/07/2017 0917   PHURINE 5.0 08/07/2017 0917   GLUCOSEU NEGATIVE 08/07/2017 0917   HGBUR NEGATIVE 08/07/2017 0917   BILIRUBINUR NEGATIVE 08/07/2017 0917   KETONESUR NEGATIVE  08/07/2017 0917   PROTEINUR NEGATIVE 08/07/2017 0917   UROBILINOGEN 1.0 11/16/2013 0405   NITRITE NEGATIVE 08/07/2017 0917   LEUKOCYTESUR NEGATIVE 08/07/2017 0917   Sepsis Labs: !!!!!!!!!!!!!!!!!!!!!!!!!!!!!!!!!!!!!!!!!!!! @LABRCNTIP (procalcitonin:4,lacticidven:4) )No results found for this or any previous visit (from the past 240 hour(s)).   Radiological Exams on Admission: Dg Chest 2 View  Result Date: 08/07/2017 CLINICAL DATA:  83 year old male with diarrhea, lower extremity weakness for several days. EXAM: CHEST  2 VIEW COMPARISON:  07/06/2017 and earlier. FINDINGS: Semi upright AP and lateral views of the chest. Stable somewhat large lung volumes, upper lobe predominant emphysema better demonstrated by CT in 2017. The lungs are clear. No pneumothorax or pleural effusion. Prior CABG. Stable cardiac size and mediastinal contours. No acute osseous abnormality identified. Negative visible bowel gas pattern. No pneumoperitoneum. IMPRESSION: No acute cardiopulmonary abnormality. Emphysema (ICD10-J43.9). Electronically Signed   By: Genevie Ann M.D.   On: 08/07/2017 10:18   Ct Abdomen Pelvis W Contrast  Result Date: 08/07/2017 CLINICAL DATA:  Diarrhea and weakness.  Loss of weight. EXAM: CT ABDOMEN AND PELVIS WITH CONTRAST TECHNIQUE: Multidetector CT imaging of the abdomen and pelvis was performed using the standard protocol following bolus administration of intravenous contrast. CONTRAST:  180mL ISOVUE-300 IOPAMIDOL (ISOVUE-300) INJECTION 61% COMPARISON:  06/26/2017 FINDINGS: Lower chest: No acute abnormality. Hepatobiliary: There is diffuse intra scratch moderate to marked intrahepatic biliary dilatation. The common bile duct is increased in caliber measuring up to 2.1 cm. Stable cyst within segment 8 of the liver measuring 1.3 cm. Pancreas: There is mild increase caliber of the pancreatic duct measuring 4 mm. Suspicious area of relative hypoenhancement within the head of pancreas measuring 2.2 by 1.9  cm, image 24 of series 4. Cannot rule out primary pancreatic neoplasm. Spleen: Normal in size without focal abnormality. Adrenals/Urinary Tract: The adrenal glands are normal. There is new left-sided mild hydronephrosis. No obstructing stone or mass. Urinary bladder is unremarkable. Stomach/Bowel: The stomach appears normal. No dilated loops of small bowel identified. The appendix is visualized and appears normal. No pathologic dilatation of the colon. Vascular/Lymphatic: Aortic atherosclerosis. No aneurysm. No adenopathy within the upper abdomen. No pelvic or inguinal adenopathy. Reproductive: Prostate gland appears enlarged. Other: No free fluid or fluid collections. No peritoneal nodularity or mass. Musculoskeletal: Scoliosis deformity is convex towards the right. There is multi level degenerative disc disease noted. IMPRESSION: 1. Interval development of biliary dilatation and increase caliber of the pancreatic duct. Underlying head of pancreas lesion is suspected. Recommend further evaluation with contrast enhanced MRCP. 2. No evidence for liver metastasis or upper abdominal adenopathy. 3. There is left-sided hydronephrosis. New from previous exam. Etiology indeterminate. No obstructing stone or mass identified. Urologic consultation is advised. 4.  Aortic Atherosclerosis (ICD10-I70.0). 5. Prostate gland enlargement. Electronically Signed   By: Kerby Moors M.D.   On: 08/07/2017 15:20    EKG: Independently reviewed. Sinus, QTc 416  Assessment/Plan Principal Problem:   Elevated liver enzymes Active Problems:   CAD (coronary artery disease), S/p stenting of his ostial LAD- 2006.  CABG 2011     HTN (hypertension)   PAF (paroxysmal atrial fibrillation) (HCC)   HLD (hyperlipidemia)   Pancreatic mass   1. Elevated LFT's 1. LFTs noted to be elevated at time of admission, including his bilirubin 2. CT abdomen pelvis personally reviewed.  Patient with findings worrisome for pancreatic head lesion and  biliary dilatation. 3. MRCP has been ordered, follow-up on results 4. And results of MRCP, formal gastroenterology consultation will likely be warranted for ERCP 5. Repeat LFTs in the morning 2. Pancreatic head lesion 1. A new finding on CT abdomen pelvis 2. MRCP has been ordered 3. We will check CA-19-9 3. CAD 1. Appears stable at this time 2. Denies chest pain 4. HTN 1. Blood Pressure currently stable 2. Continue home regimen as tolerated 5. PAF 1. Presently rate controlled 2. Patient on Eliquis for secondary stroke prevention 3. Given above workup, we will hold Eliquis  and continue on Lovenox per pharmacy dosing 6. Chronic anticoagulation 1. Per above, had been on Eliquis for history of atrial fibrillation 2. Will order Lovenox per pharmacy dosing while above workup is being done 7. HLD 1. Appears stable at this time.  Continue home regimen 8. Hydronephrosis 1. Incidental finding noted on CT abdomen pelvis with radiologist recommendation for formal urology consultation. 2. Urology has been consulted through the emergency department.  Renal function appears stable 3. Repeat basic metabolic panel in the morning  DVT prophylaxis: Lovenox  Code Status: Full Family Communication: Pt in room Disposition Plan: Uncertain at this time  Consults called: Urology Admission status: Inpatient as it would likely require greater than 2 midnight stay to workup and manage elevated liver function test  Marylu Lund MD [facility] Pager [phone]  If 7PM-7AM, please contact night-coverage [email or web address] Password [email or web address]  08/07/2017, 3:53 PM

## 2017-08-07 NOTE — ED Provider Notes (Signed)
[facility] DEPT Provider Note   CSN: [id number] Arrival date & time: 08/07/17  2423     History   Chief Complaint Chief Complaint  Patient presents with  . Diarrhea  . Extremity Weakness    HPI Alexander Farley is a 83 y.o. male.  HPI Patient presents with concern of weakness. No clear precipitant, but over the past 2 months the patient has been progressively more weak than usual.  During this illness he has had persistent anorexia, but no nausea, vomiting, diarrhea. He has lost approximately 20 pounds. No dyspnea, no pain, no focal weakness. Symptoms are worse with activity. Past Medical History:  Diagnosis Date  . Anxiety   . Back pain   . Carotid stenosis    Carotid US (3/15):  Bilateral ICA 1-39%  . Coronary artery disease    a.  hx stent to LAD;  b. LHC (2/11):  LAD with 70 and 80% ISR, D1 90%, CFX 30-40%, RCA 30-40% >>> CABG (L-LAD, S-OM)  . Dyslipidemia   . ED (erectile dysfunction)   . History of shingles    post herpetic neuralgia (L chest)  . Hx of cardiovascular stress test 02/2014   Normal study with no ischemia.  LVF was normal with EF 66%.  Marland Kitchen Hx of echocardiogram     Echo (10/05):  EF 55-60%  . Hypertension   . Palpitations   . Paroxysmal atrial fibrillation (HCC)   . RBBB plus LA hemiblock     Patient Active Problem List   Diagnosis Date Noted  . 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 Dizziness 05/03/2017  . Malnutrition of moderate degree 05/31/2016  . Syncope 05/30/2016  . Chest pain 05/30/2016  . S/P craniotomy 05/19/2016  . Subdural hematoma ([place]) 05/15/2016  . HLD (hyperlipidemia) 04/23/2014  . Shingles 04/23/2014  . Unstable angina, sounds anginal, neg. nuc, may be GI component  03/27/2014  . Generalized weakness 11/16/2013  . Near syncope 11/16/2013  . PAF (paroxysmal atrial fibrillation) ([place]) 11/16/2013  . HTN (hypertension) 05/03/2011  . CAD (coronary artery disease), S/p stenting of his  ostial LAD- 2006.  CABG 2011   02/02/2011  . Dizziness - light-headed 02/01/2011  . Arm numbness 02/01/2011    Past Surgical History:  Procedure Laterality Date  . APPLICATION OF CRANIAL NAVIGATION N/A 05/19/2016   Procedure: APPLICATION OF CRANIAL NAVIGATION;  Surgeon: Eustace Moore, MD;  Location: [facility];  Service: Neurosurgery;  Laterality: N/A;  . CARDIAC CATHETERIZATION  10/12/2009   NORMAL LEFT VENTRICULAR SYSTOLIC FUNCTION. EF 60-65%  . CORONARY ANGIOPLASTY WITH STENT PLACEMENT    . CORONARY ARTERY BYPASS GRAFT     FEb. 2011  . CRANIOTOMY N/A 05/19/2016   Procedure: CRANIOTOMY HEMATOMA EVACUATION SUBDURAL with brain lab;  Surgeon: Eustace Moore, MD;  Location: [facility];  Service: Neurosurgery;  Laterality: N/A;  . EYE SURGERY  02-07-11   Right Eye       Home Medications    Prior to Admission medications   Medication Sig Start Date End Date Taking? Authorizing Provider  acetaminophen (TYLENOL) 325 MG tablet Take 2 tablets (650 mg total) by mouth every 4 (four) hours as needed for headache or mild pain. 06/01/16   Geradine Girt, DO  apixaban (ELIQUIS) 5 MG TABS tablet Take 1 tablet (5 mg total) by mouth 2 (two) times daily. 07/19/17   Nahser, Wonda Cheng, MD  Cyanocobalamin (B-12) 1000 MCG TBCR Take 500 mcg by mouth 2 (two)  times daily.  08/15/14   Nahser, Wonda Cheng, MD  famotidine (PEPCID) 20 MG tablet Take 1 tablet (20 mg total) by mouth 2 (two) times daily. 06/30/17   Debbe Odea, MD  feeding supplement (BOOST / RESOURCE BREEZE) LIQD Take 1 Container by mouth 3 (three) times daily between meals. 06/30/17   Debbe Odea, MD  folic acid (FOLVITE) 1 MG tablet Take 1 mg by mouth daily.      [provider]  KLOR-CON M10 10 MEQ tablet TAKE 1 TABLET BY MOUTH EVERY DAY 07/24/17   Nahser, Wonda Cheng, MD  lisinopril (PRINIVIL,ZESTRIL) 10 MG tablet Take 1 tablet (10 mg total) by mouth daily. 07/24/17   Nahser, Wonda Cheng, MD  LORazepam (ATIVAN) 1 MG tablet Take 0.5-1 tablets  by mouth 2 (two) times daily as needed for anxiety. 03/23/17   [provider]  metoprolol tartrate (LOPRESSOR) 25 MG tablet Take 1 tablet (25 mg total) by mouth 2 (two) times daily. 07/19/17 10/17/17  Nahser, Wonda Cheng, MD  Multiple Vitamin (MULTIVITAMIN) capsule Take 1 capsule by mouth daily.      [provider]  nitroGLYCERIN (NITROSTAT) 0.4 MG SL tablet Place 1 tablet (0.4 mg total) under the tongue every 5 (five) minutes as needed for chest pain. 09/29/16 06/26/17  Nahser, Wonda Cheng, MD  pantoprazole (PROTONIX) 20 MG tablet Take 1 tablet (20 mg total) by mouth daily. 06/30/17   Debbe Odea, MD  Polyethyl Glycol-Propyl Glycol (SYSTANE ULTRA) 0.4-0.3 % SOLN Apply 1 drop to eye daily as needed (irratation).    [provider]  predniSONE (DELTASONE) 20 MG tablet Take 3 tablets (60 mg total) by mouth daily with breakfast. 07/01/17   Debbe Odea, MD  rosuvastatin (CRESTOR) 20 MG tablet Take 1 tablet (20 mg total) by mouth daily. 07/24/17 10/22/17  Nahser, Wonda Cheng, MD  Tamsulosin HCl (FLOMAX) 0.4 MG CAPS Take 0.4 mg by mouth daily.      [provider]  traMADol (ULTRAM) 50 MG tablet Take 1 tablet (50 mg total) by mouth every 6 (six) hours as needed for moderate pain. 06/30/17   Debbe Odea, MD    Family History Family History  Problem Relation Age of Onset  . Stroke Mother   . Stroke Father   . Heart attack Neg Hx     Social History Social History   Tobacco Use  . Smoking status: Former Smoker    Last attempt to quit: 01/30/1985    Years since quitting: 32.5  . Smokeless tobacco: Never Used  Substance Use Topics  . Alcohol use: No  . Drug use: No     Allergies   Zocor [simvastatin]   Review of Systems Review of Systems  Constitutional:       Per HPI, otherwise negative  HENT:       Per HPI, otherwise negative  Respiratory:       Per HPI, otherwise negative  Cardiovascular:       Per HPI, otherwise negative  Gastrointestinal: Negative for  abdominal pain and vomiting.  Endocrine:       Negative aside from HPI  Genitourinary:       Neg aside from HPI   Musculoskeletal:       Per HPI, otherwise negative  Skin: Negative.   Neurological: Positive for weakness. Negative for syncope.     Physical Exam Updated Vital Signs BP 129/82   Pulse 71   Resp 16   SpO2 100%   Physical Exam  Constitutional: He  has a sickly appearance.  HENT:  Head: Normocephalic and atraumatic.  Eyes: Conjunctivae are normal.  Neck: Neck supple.  Cardiovascular: Normal rate and regular rhythm.  No murmur heard. Pulmonary/Chest: Effort normal and breath sounds normal. No respiratory distress.  Abdominal: Soft. There is no tenderness. There is no guarding.  Musculoskeletal: He exhibits no edema.  Neurological: He is alert. He displays atrophy. He displays no tremor. He exhibits normal muscle tone. He displays no seizure activity. Coordination normal.  Skin: Skin is warm and dry.  Psychiatric: He has a normal mood and affect.  Nursing note and vitals reviewed.    ED Treatments / Results  Labs (all labs ordered are listed, but only abnormal results are displayed) Labs Reviewed  COMPREHENSIVE METABOLIC PANEL - Abnormal; Notable for the following components:      Result Value   Chloride 100 (*)    Glucose, Bld 101 (*)    BUN 28 (*)    Creatinine, Ser 1.37 (*)    AST 536 (*)    ALT 1,228 (*)    Alkaline Phosphatase 601 (*)    Total Bilirubin 3.7 (*)    GFR calc non Af Amer 46 (*)    GFR calc Af Amer 54 (*)    All other components within normal limits  CBC WITH DIFFERENTIAL/PLATELET - Abnormal; Notable for the following components:   RDW 18.8 (*)    All other components within normal limits  LIPASE, BLOOD - Abnormal; Notable for the following components:   Lipase 62 (*)    All other components within normal limits  TROPONIN I - Abnormal; Notable for the following components:   Troponin I 0.03 (*)    All other components within  normal limits  URINALYSIS, ROUTINE W REFLEX MICROSCOPIC - Abnormal; Notable for the following components:   Color, Urine AMBER (*)    APPearance HAZY (*)    All other components within normal limits    EKG  EKG Interpretation  Date/Time:  Monday August 07 2017 09:39:59 EST Ventricular Rate:  58 PR Interval:    QRS Duration: 137 QT Interval:  423 QTC Calculation: 416 R Axis:   -75 Text Interpretation:  Sinus rhythm RBBB and LAFB No significant change since last tracing Abnormal ekg Confirmed by Carmin Muskrat [phone]) on 08/07/2017 9:54:57 AM       Radiology Dg Chest 2 View  Result Date: 08/07/2017 CLINICAL DATA:  83 year old male with diarrhea, lower extremity weakness for several days. EXAM: CHEST  2 VIEW COMPARISON:  07/06/2017 and earlier. FINDINGS: Semi upright AP and lateral views of the chest. Stable somewhat large lung volumes, upper lobe predominant emphysema better demonstrated by CT in 2017. The lungs are clear. No pneumothorax or pleural effusion. Prior CABG. Stable cardiac size and mediastinal contours. No acute osseous abnormality identified. Negative visible bowel gas pattern. No pneumoperitoneum. IMPRESSION: No acute cardiopulmonary abnormality. Emphysema (ICD10-J43.9). Electronically Signed   By: Genevie Ann M.D.   On: 08/07/2017 10:18   Ct Abdomen Pelvis W Contrast  Result Date: 08/07/2017 CLINICAL DATA:  Diarrhea and weakness.  Loss of weight. EXAM: CT ABDOMEN AND PELVIS WITH CONTRAST TECHNIQUE: Multidetector CT imaging of the abdomen and pelvis was performed using the standard protocol following bolus administration of intravenous contrast. CONTRAST:  190mL ISOVUE-300 IOPAMIDOL (ISOVUE-300) INJECTION 61% COMPARISON:  06/26/2017 FINDINGS: Lower chest: No acute abnormality. Hepatobiliary: There is diffuse intra scratch moderate to marked intrahepatic biliary dilatation. The common bile duct is increased in caliber measuring up to 2.1  cm. Stable cyst within segment 8 of  the liver measuring 1.3 cm. Pancreas: There is mild increase caliber of the pancreatic duct measuring 4 mm. Suspicious area of relative hypoenhancement within the head of pancreas measuring 2.2 by 1.9 cm, image 24 of series 4. Cannot rule out primary pancreatic neoplasm. Spleen: Normal in size without focal abnormality. Adrenals/Urinary Tract: The adrenal glands are normal. There is new left-sided mild hydronephrosis. No obstructing stone or mass. Urinary bladder is unremarkable. Stomach/Bowel: The stomach appears normal. No dilated loops of small bowel identified. The appendix is visualized and appears normal. No pathologic dilatation of the colon. Vascular/Lymphatic: Aortic atherosclerosis. No aneurysm. No adenopathy within the upper abdomen. No pelvic or inguinal adenopathy. Reproductive: Prostate gland appears enlarged. Other: No free fluid or fluid collections. No peritoneal nodularity or mass. Musculoskeletal: Scoliosis deformity is convex towards the right. There is multi level degenerative disc disease noted. IMPRESSION: 1. Interval development of biliary dilatation and increase caliber of the pancreatic duct. Underlying head of pancreas lesion is suspected. Recommend further evaluation with contrast enhanced MRCP. 2. No evidence for liver metastasis or upper abdominal adenopathy. 3. There is left-sided hydronephrosis. New from previous exam. Etiology indeterminate. No obstructing stone or mass identified. Urologic consultation is advised. 4.  Aortic Atherosclerosis (ICD10-I70.0). 5. Prostate gland enlargement. Electronically Signed   By: Kerby Moors M.D.   On: 08/07/2017 15:20    Procedures Procedures (including critical care time)  Medications Ordered in ED Medications  iopamidol (ISOVUE-300) 61 % injection (not administered)  gadobenate dimeglumine (MULTIHANCE) injection 15 mL (not administered)  sodium chloride 0.9 % bolus 1,000 mL (0 mLs Intravenous Stopped 08/07/17 1401)  sodium chloride  0.9 % bolus 1,000 mL (0 mLs Intravenous Stopped 08/07/17 1531)  fentaNYL (SUBLIMAZE) injection 50 mcg (50 mcg Intravenous Given 08/07/17 1412)  iopamidol (ISOVUE-300) 61 % injection 100 mL (100 mLs Intravenous Contrast Given 08/07/17 1442)     Initial Impression / Assessment and Plan / ED Course  I have reviewed the triage vital signs and the nursing notes.  Pertinent labs & imaging results that were available during my care of the patient were reviewed by me and considered in my medical decision making (see chart for details).  On repeat exam patient is in similar condition, appears frail. I discussed initial findings with him, CT scan pending  Update:, CT results available. I had a lengthy conversation with the patient, his wife and his son about likely pancreatic cancer given the abnormalities on labs, CT. I discussed patient's case with our urologist as well given CT abnormality left-sided hydronephrosis Patient continues to deny urinary complaints. Given concern for pancreatic cancer versus other hepatobiliary dysfunction, the patient requires admission for further evaluation and management.  Final Clinical Impressions(s) / ED Diagnoses  Acute liver failure Pancreatic mass Weakness   Carmin Muskrat, MD 08/07/17 1620

## 2017-08-07 NOTE — ED Triage Notes (Signed)
Pt arrived to [facility] via [facility] from home c/o diarrhea and weakness in the legs for the last several days. Pt is having trouble swallowing food and has had decreased intake for the last 2 months. Per the patient's wife, pt has lost 20 pounds in the last 2 months.

## 2017-08-07 NOTE — ED Notes (Signed)
Patient transported to X-ray 

## 2017-08-07 NOTE — ED Notes (Signed)
Patient transported to MRI 

## 2017-08-07 NOTE — ED Notes (Signed)
Urinal at bedside. Pt has been made aware of UA

## 2017-08-07 NOTE — ED Notes (Signed)
Attempted to call report to the floor at [place]. Was told by charge nurse to call back in 10 minutes at 7pm. Will attempt to call report again at 7pm.

## 2017-08-07 NOTE — ED Notes (Signed)
Assigned rm [id number] @ [id number] call report @ 18:41

## 2017-08-07 NOTE — ED Notes (Signed)
Bed: WA21 Expected date:  Expected time:  Means of arrival:  Comments: diarrhea

## 2017-08-07 NOTE — ED Notes (Signed)
RN notified of abnormal lab 

## 2017-08-07 NOTE — ED Notes (Signed)
Pt stuck x2 for labs unsuccessful

## 2017-08-07 NOTE — ED Notes (Signed)
This RN attempted to gain IV access x2 unsuccessfully. Another RN notified and will try to gain IV access and draw labs.

## 2017-08-08 ENCOUNTER — Other Ambulatory Visit: Payer: Self-pay | Admitting: Urology

## 2017-08-08 DIAGNOSIS — I480 Paroxysmal atrial fibrillation: Secondary | ICD-10-CM

## 2017-08-08 DIAGNOSIS — I10 Essential (primary) hypertension: Secondary | ICD-10-CM

## 2017-08-08 LAB — COMPREHENSIVE METABOLIC PANEL
ALBUMIN: 2.9 g/dL — AB (ref 3.5–5.0)
ALK PHOS: 523 U/L — AB (ref 38–126)
ALT: 966 U/L — AB (ref 17–63)
ANION GAP: 7 (ref 5–15)
AST: 393 U/L — AB (ref 15–41)
BILIRUBIN TOTAL: 5.5 mg/dL — AB (ref 0.3–1.2)
BUN: 22 mg/dL — AB (ref 6–20)
CALCIUM: 8.7 mg/dL — AB (ref 8.9–10.3)
CO2: 23 mmol/L (ref 22–32)
CREATININE: 1.09 mg/dL (ref 0.61–1.24)
Chloride: 107 mmol/L (ref 101–111)
GFR calc Af Amer: >60 mL/min (ref >60)
GFR calc non Af Amer: >60 mL/min (ref >60)
GLUCOSE: 78 mg/dL (ref 65–99)
Potassium: 4.4 mmol/L (ref 3.5–5.1)
SODIUM: 137 mmol/L (ref 135–145)
Total Protein: 5.4 g/dL — ABNORMAL LOW (ref 6.5–8.1)

## 2017-08-08 LAB — CBC
HCT: 43.5 % (ref 39.0–52.0)
Hemoglobin: 14.3 g/dL (ref 13.0–17.0)
MCH: 29.3 pg (ref 26.0–34.0)
MCHC: 32.9 g/dL (ref 30.0–36.0)
MCV: 89.1 fL (ref 78.0–100.0)
PLATELETS: 181 10*3/uL (ref 150–400)
RBC: 4.88 MIL/uL (ref 4.22–5.81)
RDW: 18.9 % — AB (ref 11.5–15.5)
WBC: 5.8 10*3/uL (ref 4.0–10.5)

## 2017-08-08 MED ORDER — HEPARIN SODIUM (PORCINE) 5000 UNIT/ML IJ SOLN
5000.0000 [IU] | Freq: Three times a day (TID) | INTRAMUSCULAR | Status: DC
  Administered 2017-08-08 – 2017-08-15 (×19): 5000 [IU] via SUBCUTANEOUS
  Filled 2017-08-08 (×21): qty 1

## 2017-08-08 MED ORDER — HYDROCODONE-ACETAMINOPHEN 5-325 MG PO TABS
2.0000 | ORAL_TABLET | Freq: Once | ORAL | Status: AC
  Administered 2017-08-08: 2 via ORAL
  Filled 2017-08-08: qty 2

## 2017-08-08 MED ORDER — BOOST / RESOURCE BREEZE PO LIQD CUSTOM
1.0000 | Freq: Three times a day (TID) | ORAL | Status: DC
  Administered 2017-08-08 – 2017-08-15 (×12): 1 via ORAL

## 2017-08-08 MED ORDER — MORPHINE SULFATE (PF) 2 MG/ML IV SOLN
2.0000 mg | INTRAVENOUS | Status: DC | PRN
  Administered 2017-08-09 (×2): 2 mg via INTRAVENOUS
  Filled 2017-08-08 (×3): qty 1

## 2017-08-08 NOTE — Consult Note (Signed)
Subjective: CC: Left hydronephrosis.  Hx:  I was asked to see Alexander Farley by Dr. Wyline Copas for left hydronephrosis found on CT imaging on 08/07/17.   This is a new finding since a CT in October.  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  He has had some left flank pain in addition to the epigastric pain and the pain has become more severe over the last 2 months.   He has had no hematuria or voiding complaints but has been on flomax for several years for BPH.  He has no history of stones, UTI's or GU surgery.    ROS:  Review of Systems  Constitutional: Positive for malaise/fatigue and weight loss.  Respiratory: Negative for shortness of breath.   Cardiovascular: Negative for chest pain.  Gastrointestinal: Positive for abdominal pain, heartburn and nausea.  Genitourinary: Positive for flank pain.  All other systems reviewed and are negative.   Allergies  Allergen Reactions  . Zocor [Simvastatin] Other (See Comments)    HYPOTENSION    Past Medical History:  Diagnosis Date  . Anxiety   . Back pain   . Carotid stenosis    Carotid US (3/15):  Bilateral ICA 1-39%  . Coronary artery disease    a.  hx stent to LAD;  b. LHC (2/11):  LAD with 70 and 80% ISR, D1 90%, CFX 30-40%, RCA 30-40% >>> CABG (L-LAD, S-OM)  . Dyslipidemia   . ED (erectile dysfunction)   . History of shingles    post herpetic neuralgia (L chest)  . Hx of cardiovascular stress test 02/2014   Normal study with no ischemia.  LVF was normal with EF 66%.  Marland Kitchen Hx of echocardiogram     Echo (10/05):  EF 55-60%  . Hypertension   . Palpitations   . Paroxysmal atrial fibrillation (HCC)   . RBBB plus LA hemiblock     Past Surgical History:  Procedure Laterality Date  . APPLICATION OF CRANIAL NAVIGATION N/A 05/19/2016   Procedure: APPLICATION OF CRANIAL NAVIGATION;  Surgeon: Eustace Moore, MD;  Location: [facility];   Service: Neurosurgery;  Laterality: N/A;  . CARDIAC CATHETERIZATION  10/12/2009   NORMAL LEFT VENTRICULAR SYSTOLIC FUNCTION. EF 60-65%  . CORONARY ANGIOPLASTY WITH STENT PLACEMENT    . CORONARY ARTERY BYPASS GRAFT     FEb. 2011  . CRANIOTOMY N/A 05/19/2016   Procedure: CRANIOTOMY HEMATOMA EVACUATION SUBDURAL with brain lab;  Surgeon: Eustace Moore, MD;  Location: [facility];  Service: Neurosurgery;  Laterality: N/A;  . EYE SURGERY  02-07-11   Right Eye    Social History   Socioeconomic History  . Marital status: Married    Spouse name: Not on file  . Number of children: Not on file  . Years of education: Not on file  . Highest education level: Not on file  Social Needs  . Financial resource strain: Not on file  . Food insecurity - worry: Not on file  . Food insecurity - inability: Not on file  . Transportation needs - medical: Not on file  . Transportation needs - non-medical: Not on file  Occupational History  . Not on file  Tobacco Use  . Smoking status: Former Smoker    Last attempt to quit: 01/30/1985    Years since quitting: 32.5  . Smokeless tobacco: Never Used  Substance and Sexual Activity  .  Alcohol use: No  . Drug use: No  . Sexual activity: No  Other Topics Concern  . Not on file  Social History Narrative  . Not on file    Family History  Problem Relation Age of Onset  . Stroke Mother   . Stroke Father   . Heart attack Neg Hx     Anti-infectives: Anti-infectives (From admission, onward)   None      Current Facility-Administered Medications  Medication Dose Route Frequency Provider Last Rate Last Dose  . 0.9 %  sodium chloride infusion   Intravenous Continuous Donne Hazel, MD 75 mL/hr at 08/07/17 2129    . traMADol (ULTRAM) tablet 50 mg  50 mg Oral Q6H PRN Schorr, Rhetta Mura, NP   50 mg at 08/08/17 0526   His home meds have included Eliquis, Vit B12, Folate, Klor-con, lisinopril, Metoprolol, Cytotec, MVI, NTG, Pantoprazole, Prednisone,  Crestor, Tramadol and Tamsulosin  Objective: Vital signs in last 24 hours: Temp:  [97.7 F (36.5 C)-97.8 F (36.6 C)] 97.8 F (36.6 C) (12/11 0526) Pulse Rate:  [47-113] 89 (12/11 0526) Resp:  [13-21] 14 (12/11 0526) BP: (103-169)/(70-94) 104/71 (12/11 0526) SpO2:  [99 %-100 %] 100 % (12/11 0526) Weight:  [54.8 kg (120 lb 12.8 oz)] 54.8 kg (120 lb 12.8 oz) (12/10 2035)  Intake/Output from previous day: 12/10 0701 - 12/11 0700 In: 743.8 [P.O.:180; I.V.:563.8] Out: 550 [Urine:550] Intake/Output this shift: No intake/output data recorded.   Physical Exam  Constitutional: He is oriented to person, place, and time.  Thin, WD, elderly BM in NAD A/O x3.   HENT:  Head: Normocephalic and atraumatic.  Neck: Normal range of motion. Neck supple. No thyromegaly present.  Cardiovascular: Normal rate and regular rhythm.  Murmur heard. Median sternotomy scar.   Pulmonary/Chest: Effort normal and breath sounds normal. No respiratory distress.  Abdominal: Soft. He exhibits no mass. There is tenderness (left > right). There is guarding.  Musculoskeletal: Normal range of motion. He exhibits no edema or tenderness.  Lymphadenopathy:    He has no cervical adenopathy.       Right: No inguinal and no supraclavicular adenopathy present.       Left: No inguinal and no supraclavicular adenopathy present.  Neurological: He is alert and oriented to person, place, and time.  Skin: Skin is warm and dry.  Psychiatric: Mood and affect normal.    Lab Results:  Recent Labs    08/07/17 1212 08/08/17 0531  WBC 6.6 5.8  HGB 15.1 14.3  HCT 45.4 43.5  PLT 189 181   BMET Recent Labs    08/07/17 1212 08/08/17 0531  NA 135 137  K 4.8 4.4  CL 100* 107  CO2 27 23  GLUCOSE 101* 78  BUN 28* 22*  CREATININE 1.37* 1.09  CALCIUM 9.2 8.7*   PT/INR No results for input(s): LABPROT, INR in the last 72 hours. ABG No results for input(s): PHART, HCO3 in the last 72 hours.  Invalid input(s): PCO2,  PO2  Studies/Results: Dg Chest 2 View  Result Date: 08/07/2017 CLINICAL DATA:  83 year old male with diarrhea, lower extremity weakness for several days. EXAM: CHEST  2 VIEW COMPARISON:  07/06/2017 and earlier. FINDINGS: Semi upright AP and lateral views of the chest. Stable somewhat large lung volumes, upper lobe predominant emphysema better demonstrated by CT in 2017. The lungs are clear. No pneumothorax or pleural effusion. Prior CABG. Stable cardiac size and mediastinal contours. No acute osseous abnormality identified. Negative visible bowel gas pattern. No  pneumoperitoneum. IMPRESSION: No acute cardiopulmonary abnormality. Emphysema (ICD10-J43.9). Electronically Signed   By: Genevie Ann M.D.   On: 08/07/2017 10:18   Ct Abdomen Pelvis W Contrast  Result Date: 08/07/2017 CLINICAL DATA:  Diarrhea and weakness.  Loss of weight. EXAM: CT ABDOMEN AND PELVIS WITH CONTRAST TECHNIQUE: Multidetector CT imaging of the abdomen and pelvis was performed using the standard protocol following bolus administration of intravenous contrast. CONTRAST:  138mL ISOVUE-300 IOPAMIDOL (ISOVUE-300) INJECTION 61% COMPARISON:  06/26/2017 FINDINGS: Lower chest: No acute abnormality. Hepatobiliary: There is diffuse intra scratch moderate to marked intrahepatic biliary dilatation. The common bile duct is increased in caliber measuring up to 2.1 cm. Stable cyst within segment 8 of the liver measuring 1.3 cm. Pancreas: There is mild increase caliber of the pancreatic duct measuring 4 mm. Suspicious area of relative hypoenhancement within the head of pancreas measuring 2.2 by 1.9 cm, image 24 of series 4. Cannot rule out primary pancreatic neoplasm. Spleen: Normal in size without focal abnormality. Adrenals/Urinary Tract: The adrenal glands are normal. There is new left-sided mild hydronephrosis. No obstructing stone or mass. Urinary bladder is unremarkable. Stomach/Bowel: The stomach appears normal. No dilated loops of small bowel  identified. The appendix is visualized and appears normal. No pathologic dilatation of the colon. Vascular/Lymphatic: Aortic atherosclerosis. No aneurysm. No adenopathy within the upper abdomen. No pelvic or inguinal adenopathy. Reproductive: Prostate gland appears enlarged. Other: No free fluid or fluid collections. No peritoneal nodularity or mass. Musculoskeletal: Scoliosis deformity is convex towards the right. There is multi level degenerative disc disease noted. IMPRESSION: 1. Interval development of biliary dilatation and increase caliber of the pancreatic duct. Underlying head of pancreas lesion is suspected. Recommend further evaluation with contrast enhanced MRCP. 2. No evidence for liver metastasis or upper abdominal adenopathy. 3. There is left-sided hydronephrosis. New from previous exam. Etiology indeterminate. No obstructing stone or mass identified. Urologic consultation is advised. 4.  Aortic Atherosclerosis (ICD10-I70.0). 5. Prostate gland enlargement. Electronically Signed   By: Kerby Moors M.D.   On: 08/07/2017 15:20   Mr 3d Recon At Scanner  Result Date: 08/07/2017 CLINICAL DATA:  Biliary dilatation with suspected pancreatic mass on CT. EXAM: MRI ABDOMEN WITHOUT AND WITH CONTRAST (INCLUDING MRCP) TECHNIQUE: Multiplanar multisequence MR imaging of the abdomen was performed both before and after the administration of intravenous contrast. Heavily T2-weighted images of the biliary and pancreatic ducts were obtained, and three-dimensional MRCP images were rendered by post processing. CONTRAST:  21mL MULTIHANCE GADOBENATE DIMEGLUMINE 529 MG/ML IV SOLN COMPARISON:  CT 08/07/2017.  CT scan 04/14/2016. FINDINGS: Lower chest:  Unremarkable. Hepatobiliary: Multiple hepatic cysts again noted seen on prior CT scans. There is marked intra and extrahepatic biliary duct dilatation. Common duct abruptly terminates at the level of the head of the pancreas. Gallbladder nondistended. No stones evident  within the gallbladder lumen. Pancreas: There is no substantial dilatation of the main pancreatic duct although it does abruptly terminates in the head of the pancreas, in the same region as the extrahepatic biliary ducts. Although this study is markedly degraded by breathing motion, there appears to be a subtle mass in the head of the pancreas measuring about 2.5 x 1.9 cm (see images 22 and 23 of series 3. This same area is visible on postcontrast image 39 of series 1102. The lesion generates mass-effect on the distal splenic vein and portal splenic confluence. Spleen: No splenomegaly. No focal mass lesion. Adrenals/Urinary Tract: No adrenal nodule or mass. Multiple cysts are noted in the right kidney. There  is mild left hydronephrosis with multiple cysts in the left kidney. Stomach/Bowel: No gross bowel dilatation. Vascular/Lymphatic: Portal vein is patent. Postcontrast imaging has motion artifact overlie the portal vein training linear area of signal void, but there is no evidence for portal vein thrombus on precontrast imaging today or on the CT scan was performed about 90 minutes ago. Other: No substantial intraperitoneal free fluid. Musculoskeletal: Focal area of enhancement identified posterior aspect of the L3 vertebral body (image 54 series 1104). IMPRESSION: 1. Markedly degraded study secondary to marked respiratory motion from patient inability to reproducibly breath hold. 2. Apparent 2.5 x 1.9 cm subtle lesion in the head of the pancreas. The common duct abruptly terminates at the level of this lesion as does the nondilated pancreatic duct. There is mass-effect on the portal splenic confluence in the same region. Evaluation of fat planes around the celiac axis and SMA cannot be reliably performed due to the extensive motion degradation. EUS recommended to further evaluate. 3. No definite liver metastases although motion artifact could of obscure small liver lesions. 4. Artifact overlying the portal vein  on postcontrast imaging likely due to the extensive respiratory motion degradation of this exam. Portal vein appears patent on precontrast imaging and on the CT scan performed less than 2 hours ago. 5. Focal enhancement in the L3 vertebral body, not definitively characterized. Attention on follow-up recommended as metastatic lesion not excluded. Electronically Signed   By: Misty Stanley M.D.   On: 08/07/2017 17:46   Mr Abdomen Mrcp Moise Boring Contast  Result Date: 08/07/2017 CLINICAL DATA:  Biliary dilatation with suspected pancreatic mass on CT. EXAM: MRI ABDOMEN WITHOUT AND WITH CONTRAST (INCLUDING MRCP) TECHNIQUE: Multiplanar multisequence MR imaging of the abdomen was performed both before and after the administration of intravenous contrast. Heavily T2-weighted images of the biliary and pancreatic ducts were obtained, and three-dimensional MRCP images were rendered by post processing. CONTRAST:  41mL MULTIHANCE GADOBENATE DIMEGLUMINE 529 MG/ML IV SOLN COMPARISON:  CT 08/07/2017.  CT scan 04/14/2016. FINDINGS: Lower chest:  Unremarkable. Hepatobiliary: Multiple hepatic cysts again noted seen on prior CT scans. There is marked intra and extrahepatic biliary duct dilatation. Common duct abruptly terminates at the level of the head of the pancreas. Gallbladder nondistended. No stones evident within the gallbladder lumen. Pancreas: There is no substantial dilatation of the main pancreatic duct although it does abruptly terminates in the head of the pancreas, in the same region as the extrahepatic biliary ducts. Although this study is markedly degraded by breathing motion, there appears to be a subtle mass in the head of the pancreas measuring about 2.5 x 1.9 cm (see images 22 and 23 of series 3. This same area is visible on postcontrast image 39 of series 1102. The lesion generates mass-effect on the distal splenic vein and portal splenic confluence. Spleen: No splenomegaly. No focal mass lesion. Adrenals/Urinary  Tract: No adrenal nodule or mass. Multiple cysts are noted in the right kidney. There is mild left hydronephrosis with multiple cysts in the left kidney. Stomach/Bowel: No gross bowel dilatation. Vascular/Lymphatic: Portal vein is patent. Postcontrast imaging has motion artifact overlie the portal vein training linear area of signal void, but there is no evidence for portal vein thrombus on precontrast imaging today or on the CT scan was performed about 90 minutes ago. Other: No substantial intraperitoneal free fluid. Musculoskeletal: Focal area of enhancement identified posterior aspect of the L3 vertebral body (image 54 series 1104). IMPRESSION: 1. Markedly degraded study secondary to marked respiratory motion  from patient inability to reproducibly breath hold. 2. Apparent 2.5 x 1.9 cm subtle lesion in the head of the pancreas. The common duct abruptly terminates at the level of this lesion as does the nondilated pancreatic duct. There is mass-effect on the portal splenic confluence in the same region. Evaluation of fat planes around the celiac axis and SMA cannot be reliably performed due to the extensive motion degradation. EUS recommended to further evaluate. 3. No definite liver metastases although motion artifact could of obscure small liver lesions. 4. Artifact overlying the portal vein on postcontrast imaging likely due to the extensive respiratory motion degradation of this exam. Portal vein appears patent on precontrast imaging and on the CT scan performed less than 2 hours ago. 5. Focal enhancement in the L3 vertebral body, not definitively characterized. Attention on follow-up recommended as metastatic lesion not excluded. Electronically Signed   By: Misty Stanley M.D.   On: 08/07/2017 17:46   I have discussed his case with Dr. Wyline Copas.  I have reviewed his CT and MRI films and reports.  I have reviewed his Labs and hospital records.  Assessment: 1. Left hydronephrosis with ureteral obstruction of  uncertain etiology.   He has some left flank pain that could be from the obstruction.  His renal function has normalized.   He will need cystoscopy with left retrograde and probable stent.  I have reviewed the risks of bleeding, infection, ureteral injury, need for secondary procedures, thrombotic events and anesthetic complications.   I can try to set this up for later this week depending on what is planned for the biliary obstruction.  2. Pancreatic mass with biliary obstruction.  Management of this issue seems more acute than the kidney at this time.  Please let me know if I should delay scheduling cystoscopy.     CC: Dr. Marylu Lund.      Alexander Farley 08/08/2017 [phone]

## 2017-08-08 NOTE — Progress Notes (Signed)
Patient ID: Alexander Farley, male   DOB: [birth year], 82 y.o.   MRN: [id number]  PROGRESS NOTE    MATIN MATTIOLI  GUY:[id number] DOB: 10/06/1932 DOA: 08/07/2017 PCP: [name], MD   Brief Narrative:  83 year old male with history of coronary artery disease, anxiety, hypertension, paroxysmal atrial fibrillation on chronic anticoagulation presented with complaints of weakness, diarrhea, anorexia and 20 pound weight loss over the last 2 months.  He was found to have elevated LFTs along with biliary dilatation with question of pancreatic head lesion on CAT scan of the abdomen.  MRCP was ordered.   Assessment & Plan:   Principal Problem:   Elevated liver enzymes Active Problems:   CAD (coronary artery disease), S/p stenting of his ostial LAD- 2006.  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 -Spoke to on-call GI/Dr. Alessandra Bevels who will see the patient in consultation -Pain control -Prognosis is guarded  Elevated LFTs -Probably secondary to pancreatic head mass.  Monitor  Paroxysmal atrial for ablation -Currently rate controlled -Eliquis on hold -We will use heparin for DVT prophylaxis  Left-sided hydronephrosis -Urology consulting  Hypertension -Blood pressure controlled.  Monitor  Hyperlipidemia -Continue home medications once patient is on a diet   DVT prophylaxis: We will start heparin Code Status: Full Family Communication: Spoke to wife at bedside Disposition Plan: Depends on clinical outcome  Consultants: GI and urology  Procedures: None  Antimicrobials: None   Subjective: Patient seen and examined at bedside.  She still complains of intermittent abdominal pain but no current nausea or vomiting.  No overnight fever.  Objective: Vitals:   08/07/17 1855 08/07/17 1900 08/07/17 2035 08/08/17 0526  BP: 103/79 125/70 (!) 138/91 104/71   Pulse: 79 (!) 113 87 89  Resp: 13 14 15 14   Temp:   97.7 F (36.5 C) 97.8 F (36.6 C)  TempSrc:   Oral Oral  SpO2: 100% 99% 100% 100%  Weight:   54.8 kg (120 lb 12.8 oz)   Height:   5\' 9"  (1.753 m)     Intake/Output Summary (Last 24 hours) at 08/08/2017 0951 Last data filed at 08/08/2017 0500 Gross per 24 hour  Intake 743.75 ml  Output 550 ml  Net 193.75 ml   Filed Weights   08/07/17 2035  Weight: 54.8 kg (120 lb 12.8 oz)    Examination:  General exam: Thinly built elderly man lying in bed.  No acute distress Respiratory system: Bilateral decreased breath sound at bases Cardiovascular system: S1 & S2 heard, rate controlled  gastrointestinal system: Abdomen is nondistended, soft and mildly tender in the periumbilical region. Normal bowel sounds heard. Extremities: No cyanosis, clubbing, edema    Data Reviewed: I have personally reviewed following labs and imaging studies  CBC: Recent Labs  Lab 08/07/17 1212 08/08/17 0531  WBC 6.6 5.8  NEUTROABS 4.1  --   HGB 15.1 14.3  HCT 45.4 43.5  MCV 89.4 89.1  PLT 189 563   Basic Metabolic Panel: Recent Labs  Lab 08/07/17 1212 08/08/17 0531  NA 135 137  K 4.8 4.4  CL 100* 107  CO2 27 23  GLUCOSE 101* 78  BUN 28* 22*  CREATININE 1.37* 1.09  CALCIUM 9.2 8.7*   GFR: Estimated Creatinine Clearance: 40.5 mL/min (by C-G formula based on SCr of 1.09 mg/dL). Liver Function Tests: Recent Labs  Lab 08/07/17 1212  08/08/17 0531  AST 536* 393*  ALT 1,228* 966*  ALKPHOS 601* 523*  BILITOT 3.7* 5.5*  PROT 6.9 5.4*  ALBUMIN 3.6 2.9*   Recent Labs  Lab 08/07/17 1212  LIPASE 62*   No results for input(s): AMMONIA in the last 168 hours. Coagulation Profile: No results for input(s): INR, PROTIME in the last 168 hours. Cardiac Enzymes: Recent Labs  Lab 08/07/17 1212  TROPONINI 0.03*   BNP (last 3 results) No results for input(s): PROBNP in the last 8760 hours. HbA1C: No results for input(s): HGBA1C in the  last 72 hours. CBG: No results for input(s): GLUCAP in the last 168 hours. Lipid Profile: No results for input(s): CHOL, HDL, LDLCALC, TRIG, CHOLHDL, LDLDIRECT in the last 72 hours. Thyroid Function Tests: No results for input(s): TSH, T4TOTAL, FREET4, T3FREE, THYROIDAB in the last 72 hours. Anemia Panel: No results for input(s): VITAMINB12, FOLATE, FERRITIN, TIBC, IRON, RETICCTPCT in the last 72 hours. Sepsis Labs: No results for input(s): PROCALCITON, LATICACIDVEN in the last 168 hours.  No results found for this or any previous visit (from the past 240 hour(s)).       Radiology Studies: Dg Chest 2 View  Result Date: 08/07/2017 CLINICAL DATA:  83 year old male with diarrhea, lower extremity weakness for several days. EXAM: CHEST  2 VIEW COMPARISON:  07/06/2017 and earlier. FINDINGS: Semi upright AP and lateral views of the chest. Stable somewhat large lung volumes, upper lobe predominant emphysema better demonstrated by CT in 2017. The lungs are clear. No pneumothorax or pleural effusion. Prior CABG. Stable cardiac size and mediastinal contours. No acute osseous abnormality identified. Negative visible bowel gas pattern. No pneumoperitoneum. IMPRESSION: No acute cardiopulmonary abnormality. Emphysema (ICD10-J43.9). Electronically Signed   By: Genevie Ann M.D.   On: 08/07/2017 10:18   Ct Abdomen Pelvis W Contrast  Result Date: 08/07/2017 CLINICAL DATA:  Diarrhea and weakness.  Loss of weight. EXAM: CT ABDOMEN AND PELVIS WITH CONTRAST TECHNIQUE: Multidetector CT imaging of the abdomen and pelvis was performed using the standard protocol following bolus administration of intravenous contrast. CONTRAST:  159mL ISOVUE-300 IOPAMIDOL (ISOVUE-300) INJECTION 61% COMPARISON:  06/26/2017 FINDINGS: Lower chest: No acute abnormality. Hepatobiliary: There is diffuse intra scratch moderate to marked intrahepatic biliary dilatation. The common bile duct is increased in caliber measuring up to 2.1 cm. Stable  cyst within segment 8 of the liver measuring 1.3 cm. Pancreas: There is mild increase caliber of the pancreatic duct measuring 4 mm. Suspicious area of relative hypoenhancement within the head of pancreas measuring 2.2 by 1.9 cm, image 24 of series 4. Cannot rule out primary pancreatic neoplasm. Spleen: Normal in size without focal abnormality. Adrenals/Urinary Tract: The adrenal glands are normal. There is new left-sided mild hydronephrosis. No obstructing stone or mass. Urinary bladder is unremarkable. Stomach/Bowel: The stomach appears normal. No dilated loops of small bowel identified. The appendix is visualized and appears normal. No pathologic dilatation of the colon. Vascular/Lymphatic: Aortic atherosclerosis. No aneurysm. No adenopathy within the upper abdomen. No pelvic or inguinal adenopathy. Reproductive: Prostate gland appears enlarged. Other: No free fluid or fluid collections. No peritoneal nodularity or mass. Musculoskeletal: Scoliosis deformity is convex towards the right. There is multi level degenerative disc disease noted. IMPRESSION: 1. Interval development of biliary dilatation and increase caliber of the pancreatic duct. Underlying head of pancreas lesion is suspected. Recommend further evaluation with contrast enhanced MRCP. 2. No evidence for liver metastasis or upper abdominal adenopathy. 3. There is left-sided hydronephrosis. New from previous exam. Etiology indeterminate. No  obstructing stone or mass identified. Urologic consultation is advised. 4.  Aortic Atherosclerosis (ICD10-I70.0). 5. Prostate gland enlargement. Electronically Signed   By: Kerby Moors M.D.   On: 08/07/2017 15:20   Mr 3d Recon At Scanner  Result Date: 08/07/2017 CLINICAL DATA:  Biliary dilatation with suspected pancreatic mass on CT. EXAM: MRI ABDOMEN WITHOUT AND WITH CONTRAST (INCLUDING MRCP) TECHNIQUE: Multiplanar multisequence MR imaging of the abdomen was performed both before and after the administration  of intravenous contrast. Heavily T2-weighted images of the biliary and pancreatic ducts were obtained, and three-dimensional MRCP images were rendered by post processing. CONTRAST:  85mL MULTIHANCE GADOBENATE DIMEGLUMINE 529 MG/ML IV SOLN COMPARISON:  CT 08/07/2017.  CT scan 04/14/2016. FINDINGS: Lower chest:  Unremarkable. Hepatobiliary: Multiple hepatic cysts again noted seen on prior CT scans. There is marked intra and extrahepatic biliary duct dilatation. Common duct abruptly terminates at the level of the head of the pancreas. Gallbladder nondistended. No stones evident within the gallbladder lumen. Pancreas: There is no substantial dilatation of the main pancreatic duct although it does abruptly terminates in the head of the pancreas, in the same region as the extrahepatic biliary ducts. Although this study is markedly degraded by breathing motion, there appears to be a subtle mass in the head of the pancreas measuring about 2.5 x 1.9 cm (see images 22 and 23 of series 3. This same area is visible on postcontrast image 39 of series 1102. The lesion generates mass-effect on the distal splenic vein and portal splenic confluence. Spleen: No splenomegaly. No focal mass lesion. Adrenals/Urinary Tract: No adrenal nodule or mass. Multiple cysts are noted in the right kidney. There is mild left hydronephrosis with multiple cysts in the left kidney. Stomach/Bowel: No gross bowel dilatation. Vascular/Lymphatic: Portal vein is patent. Postcontrast imaging has motion artifact overlie the portal vein training linear area of signal void, but there is no evidence for portal vein thrombus on precontrast imaging today or on the CT scan was performed about 90 minutes ago. Other: No substantial intraperitoneal free fluid. Musculoskeletal: Focal area of enhancement identified posterior aspect of the L3 vertebral body (image 54 series 1104). IMPRESSION: 1. Markedly degraded study secondary to marked respiratory motion from patient  inability to reproducibly breath hold. 2. Apparent 2.5 x 1.9 cm subtle lesion in the head of the pancreas. The common duct abruptly terminates at the level of this lesion as does the nondilated pancreatic duct. There is mass-effect on the portal splenic confluence in the same region. Evaluation of fat planes around the celiac axis and SMA cannot be reliably performed due to the extensive motion degradation. EUS recommended to further evaluate. 3. No definite liver metastases although motion artifact could of obscure small liver lesions. 4. Artifact overlying the portal vein on postcontrast imaging likely due to the extensive respiratory motion degradation of this exam. Portal vein appears patent on precontrast imaging and on the CT scan performed less than 2 hours ago. 5. Focal enhancement in the L3 vertebral body, not definitively characterized. Attention on follow-up recommended as metastatic lesion not excluded. Electronically Signed   By: Misty Stanley M.D.   On: 08/07/2017 17:46   Mr Abdomen Mrcp Moise Boring Contast  Result Date: 08/07/2017 CLINICAL DATA:  Biliary dilatation with suspected pancreatic mass on CT. EXAM: MRI ABDOMEN WITHOUT AND WITH CONTRAST (INCLUDING MRCP) TECHNIQUE: Multiplanar multisequence MR imaging of the abdomen was performed both before and after the administration of intravenous contrast. Heavily T2-weighted images of the biliary and pancreatic ducts were obtained,  and three-dimensional MRCP images were rendered by post processing. CONTRAST:  85mL MULTIHANCE GADOBENATE DIMEGLUMINE 529 MG/ML IV SOLN COMPARISON:  CT 08/07/2017.  CT scan 04/14/2016. FINDINGS: Lower chest:  Unremarkable. Hepatobiliary: Multiple hepatic cysts again noted seen on prior CT scans. There is marked intra and extrahepatic biliary duct dilatation. Common duct abruptly terminates at the level of the head of the pancreas. Gallbladder nondistended. No stones evident within the gallbladder lumen. Pancreas: There is no  substantial dilatation of the main pancreatic duct although it does abruptly terminates in the head of the pancreas, in the same region as the extrahepatic biliary ducts. Although this study is markedly degraded by breathing motion, there appears to be a subtle mass in the head of the pancreas measuring about 2.5 x 1.9 cm (see images 22 and 23 of series 3. This same area is visible on postcontrast image 39 of series 1102. The lesion generates mass-effect on the distal splenic vein and portal splenic confluence. Spleen: No splenomegaly. No focal mass lesion. Adrenals/Urinary Tract: No adrenal nodule or mass. Multiple cysts are noted in the right kidney. There is mild left hydronephrosis with multiple cysts in the left kidney. Stomach/Bowel: No gross bowel dilatation. Vascular/Lymphatic: Portal vein is patent. Postcontrast imaging has motion artifact overlie the portal vein training linear area of signal void, but there is no evidence for portal vein thrombus on precontrast imaging today or on the CT scan was performed about 90 minutes ago. Other: No substantial intraperitoneal free fluid. Musculoskeletal: Focal area of enhancement identified posterior aspect of the L3 vertebral body (image 54 series 1104). IMPRESSION: 1. Markedly degraded study secondary to marked respiratory motion from patient inability to reproducibly breath hold. 2. Apparent 2.5 x 1.9 cm subtle lesion in the head of the pancreas. The common duct abruptly terminates at the level of this lesion as does the nondilated pancreatic duct. There is mass-effect on the portal splenic confluence in the same region. Evaluation of fat planes around the celiac axis and SMA cannot be reliably performed due to the extensive motion degradation. EUS recommended to further evaluate. 3. No definite liver metastases although motion artifact could of obscure small liver lesions. 4. Artifact overlying the portal vein on postcontrast imaging likely due to the extensive  respiratory motion degradation of this exam. Portal vein appears patent on precontrast imaging and on the CT scan performed less than 2 hours ago. 5. Focal enhancement in the L3 vertebral body, not definitively characterized. Attention on follow-up recommended as metastatic lesion not excluded. Electronically Signed   By: Misty Stanley M.D.   On: 08/07/2017 17:46        Scheduled Meds: Continuous Infusions: . sodium chloride 75 mL/hr at 08/07/17 2129     LOS: 1 day        Aline August, MD [facility] Pager [phone]  If 7PM-7AM, please contact night-coverage [email or web address] Password [email or web address] 08/08/2017, 9:51 AM

## 2017-08-08 NOTE — Progress Notes (Signed)
Patient lost IV access. Nurse attempted to obtain IV acces x 1. Attempt was unsuccessful. Nurse entered order for IV team to establish IV access.

## 2017-08-08 NOTE — Progress Notes (Signed)
Patient ID: Alexander Farley, male   DOB: 1932-10-17, 83 y.o.   MRN: [id number]  I have tentatively scheduled the cystoscopy with left retrograde and possible stent for midday on Thursday.   If there are other procedures that take precedence, please let me know and I will reschedule.

## 2017-08-08 NOTE — Progress Notes (Signed)
Initial Nutrition Assessment  DOCUMENTATION CODES:   Non-severe (moderate) malnutrition in context of acute illness/injury, Underweight  INTERVENTION:   -Provide Boost Breeze po TID, each supplement provides 250 kcal and 9 grams of protein -Encourage PO intake -RD will continue to monitor for plan and diet advancement  NUTRITION DIAGNOSIS:   Moderate Malnutrition related to acute illness(new pancreatic mass) as evidenced by percent weight loss, energy intake < 75% for > 7 days, moderate fat depletion, moderate muscle depletion.  GOAL:   Patient will meet greater than or equal to 90% of their needs  MONITOR:   PO intake, Supplement acceptance, Labs, Weight trends, I & O's  REASON FOR ASSESSMENT:   Consult Assessment of nutrition requirement/status  ASSESSMENT:   83 year old male with history of coronary artery disease, anxiety, hypertension, paroxysmal atrial fibrillation on chronic anticoagulation presented with complaints of weakness, diarrhea, anorexia and 20 pound weight loss over the last 2 months.  He was found to have elevated LFTs along with biliary dilatation with question of pancreatic head lesion on CAT scan of the abdomen.  MRCP was ordered.  Patient in room with wife at bedside. Pt reports a burning sensation whenever he is eating or drinking, anything can cause these symptoms. Pt states he hasn't eaten much more than bites at mealtimes for 2 months now. Pt with new diagnosis of pancreatic mass. Pt denies issues chewing or swallowing.  Pt likes Boost Breeze supplements and was trying to drink these at home. RD will place order for supplement.   Per pt, UBW is 146-147 lb. Per chart review, pt has lost 37 lb since 2/1 (24% wt loss x 10 months, significant for time frame).  Labs reviewed. Medications reviewed.  NUTRITION - FOCUSED PHYSICAL EXAM:    Most Recent Value  Orbital Region  Mild depletion  Upper Arm Region  Moderate depletion  Thoracic and Lumbar Region   Unable to assess  Buccal Region  Mild depletion  Temple Region  Moderate depletion  Clavicle Bone Region  Mild depletion  Clavicle and Acromion Bone Region  Mild depletion  Scapular Bone Region  Unable to assess  Dorsal Hand  Mild depletion  Patellar Region  Unable to assess  Anterior Thigh Region  Unable to assess  Posterior Calf Region  Unable to assess  Edema (RD Assessment)  None       Diet Order:  Diet clear liquid Room service appropriate? Yes; Fluid consistency: Thin  EDUCATION NEEDS:   Education needs have been addressed  Skin:  Skin Assessment: Reviewed RN Assessment  Last BM:  12/10  Height:   Ht Readings from Last 1 Encounters:  08/07/17 5\' 9"  (1.753 m)    Weight:   Wt Readings from Last 1 Encounters:  08/07/17 120 lb 12.8 oz (54.8 kg)    Ideal Body Weight:  72.7 kg  BMI:  Body mass index is 17.84 kg/m.  Estimated Nutritional Needs:   Kcal:  1600-1800  Protein:  70-80g  Fluid:  1.8L/day  Clayton Bibles, MS, RD, LDN [place] Pager: [phone] After Hours Pager: [phone]

## 2017-08-08 NOTE — Consult Note (Signed)
Referring Provider:  [place] Primary Care Physician:  [name], MD Primary Gastroenterologist:  Dr. Michail Sermon  Reason for Consultation:  Pancreatic head mass  HPI: Alexander Farley is a 83 y.o. 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 CT abdomen pelvis was ordered which showed biliary dilatation with the hypoenhancement within the head of the pancreas. There were no MRI-MRCP yesterday which again revealed 2.5 cm subtle lesion in the head of the pancreas with mass effect on portal splenic confluence and abrupt termination of CBD at the level of lesion. GI is consulted for further evaluation.   Patient seen and examined at bedside. According to patient, he continued to have left-sided abdominal pain since discharge  which started getting worse in last few days. He was also feeling weak. He denied any diarrhea to me. Complaining of constipation. Denied any blood in the stool or black stool. Complaining of decreased appetite since last 2 weeks. Denied any fever     Past Medical History:  Diagnosis Date  . Anxiety   . Back pain   . Carotid stenosis    Carotid US (3/15):  Bilateral ICA 1-39%  . Coronary artery disease    a.  hx stent to LAD;  b. LHC (2/11):  LAD with 70 and 80% ISR, D1 90%, CFX 30-40%, RCA 30-40% >>> CABG (L-LAD, S-OM)  . Dyslipidemia   . ED (erectile dysfunction)   . History of shingles    post herpetic neuralgia (L chest)  . Hx of cardiovascular stress test 02/2014   Normal study with no ischemia.  LVF was normal with EF 66%.  Marland Kitchen Hx of echocardiogram     Echo (10/05):  EF 55-60%  . Hypertension   . Palpitations   . Paroxysmal atrial fibrillation (HCC)   . RBBB plus LA hemiblock     Past Surgical  History:  Procedure Laterality Date  . APPLICATION OF CRANIAL NAVIGATION N/A 05/19/2016   Procedure: APPLICATION OF CRANIAL NAVIGATION;  Surgeon: Eustace Moore, MD;  Location: [place] [facility];  Service: Neurosurgery;  Laterality: N/A;  . CARDIAC CATHETERIZATION  10/12/2009   NORMAL LEFT VENTRICULAR SYSTOLIC FUNCTION. EF 60-65%  . CORONARY ANGIOPLASTY WITH STENT PLACEMENT    . CORONARY ARTERY BYPASS GRAFT     FEb. 2011  . CRANIOTOMY N/A 05/19/2016   Procedure: CRANIOTOMY HEMATOMA EVACUATION SUBDURAL with brain lab;  Surgeon: Eustace Moore, MD;  Location: [place] [facility];  Service: Neurosurgery;  Laterality: N/A;  . EYE SURGERY  02-07-11   Right Eye    Prior to Admission medications   Medication Sig Start Date End Date Taking? Authorizing Provider  acetaminophen (TYLENOL) 325 MG tablet Take 2 tablets (650 mg total) by mouth every 4 (four) hours as needed for headache or mild pain. 06/01/16  Yes Vann, Jessica U, DO  Cyanocobalamin (B-12) 1000 MCG TBCR Take 500 mcg by mouth 2 (two) times daily.  08/15/14  Yes Nahser, Wonda Cheng, MD  diazepam (VALIUM) 10 MG tablet Take 10 mg by mouth every 6 (six) hours as needed for anxiety (hiccups).   Yes [provider]  famotidine (PEPCID) 20 MG tablet Take 1 tablet (20 mg total) by mouth 2 (two) times daily. 06/30/17  Yes Debbe Odea, MD  folic acid (FOLVITE) 1 MG  tablet Take 1 mg by mouth daily.     Yes [provider]  KLOR-CON M10 10 MEQ tablet TAKE 1 TABLET BY MOUTH EVERY DAY 07/24/17  Yes Nahser, Wonda Cheng, MD  lisinopril (PRINIVIL,ZESTRIL) 10 MG tablet Take 1 tablet (10 mg total) by mouth daily. 07/24/17  Yes Nahser, Wonda Cheng, MD  LORazepam (ATIVAN) 1 MG tablet Take 0.5-1 tablets by mouth 2 (two) times daily as needed for anxiety. 03/23/17  Yes [provider]  metoprolol tartrate (LOPRESSOR) 25 MG tablet Take 1 tablet (25 mg total) by mouth 2 (two) times daily. 07/19/17 10/17/17 Yes Nahser, Wonda Cheng, MD  misoprostol (CYTOTEC) 100 MCG  tablet Take 100 mcg by mouth 4 (four) times daily. 08/02/17  Yes [provider]  Multiple Vitamin (MULTIVITAMIN) capsule Take 1 capsule by mouth daily.     Yes [provider]  pantoprazole (PROTONIX) 20 MG tablet Take 1 tablet (20 mg total) by mouth daily. 06/30/17  Yes Debbe Odea, MD  rosuvastatin (CRESTOR) 20 MG tablet Take 1 tablet (20 mg total) by mouth daily. 07/24/17 10/22/17 Yes Nahser, Wonda Cheng, MD  sulfamethoxazole-trimethoprim (BACTRIM DS,SEPTRA DS) 800-160 MG tablet Take 1 tablet by mouth every other day. 07/17/17  Yes [provider]  Tamsulosin HCl (FLOMAX) 0.4 MG CAPS Take 0.4 mg by mouth daily.     Yes [provider]  traMADol (ULTRAM) 50 MG tablet Take 1 tablet (50 mg total) by mouth every 6 (six) hours as needed for moderate pain. 06/30/17  Yes Debbe Odea, MD  apixaban (ELIQUIS) 5 MG TABS tablet Take 1 tablet (5 mg total) by mouth 2 (two) times daily. 07/19/17   Nahser, Wonda Cheng, MD  feeding supplement (BOOST / RESOURCE BREEZE) LIQD Take 1 Container by mouth 3 (three) times daily between meals. 06/30/17   Debbe Odea, MD  nitroGLYCERIN (NITROSTAT) 0.4 MG SL tablet Place 1 tablet (0.4 mg total) under the tongue every 5 (five) minutes as needed for chest pain. 09/29/16 06/26/17  Nahser, Wonda Cheng, MD  Polyethyl Glycol-Propyl Glycol (SYSTANE ULTRA) 0.4-0.3 % SOLN Apply 1 drop to eye daily as needed (irratation).    [provider]  predniSONE (DELTASONE) 20 MG tablet Take 3 tablets (60 mg total) by mouth daily with breakfast. Patient not taking: Reported on 08/07/2017 07/01/17   Debbe Odea, MD    Scheduled Meds: Continuous Infusions: . sodium chloride 75 mL/hr at 08/07/17 2129   PRN Meds:.traMADol  Allergies as of 08/07/2017 - Review Complete 08/07/2017  Allergen Reaction Noted  . Zocor [simvastatin] Other (See Comments) 01/31/2011    Family History  Problem Relation Age of Onset  . Stroke Mother   . Stroke Father   . Heart  attack Neg Hx     Social History   Socioeconomic History  . Marital status: Married    Spouse name: Not on file  . Number of children: Not on file  . Years of education: Not on file  . Highest education level: Not on file  Social Needs  . Financial resource strain: Not on file  . Food insecurity - worry: Not on file  . Food insecurity - inability: Not on file  . Transportation needs - medical: Not on file  . Transportation needs - non-medical: Not on file  Occupational History  . Not on file  Tobacco Use  . Smoking status: Former Smoker    Last attempt to quit: 01/30/1985    Years since quitting: 32.5  . Smokeless tobacco: Never Used  Substance and Sexual Activity  . Alcohol use: No  . Drug use: No  . Sexual activity: No  Other Topics Concern  . Not on file  Social History Narrative  . Not on file    Review of Systems: Review of Systems  Constitutional: Positive for malaise/fatigue. Negative for chills and fever.  HENT: Negative for hearing loss and tinnitus.   Eyes: Negative for blurred vision and double vision.  Respiratory: Negative for cough and hemoptysis.   Cardiovascular: Negative for chest pain and palpitations.  Gastrointestinal: Positive for abdominal pain and constipation. Negative for blood in stool and melena.  Genitourinary: Negative for dysuria and urgency.  Musculoskeletal: Positive for myalgias.  Skin: Negative for rash.  Neurological: Positive for weakness. Negative for seizures and loss of consciousness.  Endo/Heme/Allergies: Does not bruise/bleed easily.  Psychiatric/Behavioral: Negative for hallucinations and suicidal ideas.    Physical Exam: Vital signs: Vitals:   08/07/17 2035 08/08/17 0526  BP: (!) 138/91 104/71  Pulse: 87 89  Resp: 15 14  Temp: 97.7 F (36.5 C) 97.8 F (36.6 C)  SpO2: 100% 100%   Last BM Date: 08/07/17 Physical Exam  Constitutional: He is oriented to person, place, and time. He appears well-developed and  well-nourished. No distress.  HENT:  Head: Normocephalic and atraumatic.  Mouth/Throat: No oropharyngeal exudate.  Eyes: EOM are normal. Scleral icterus is present.  Neck: Neck supple. No thyromegaly present.  Cardiovascular: Normal rate, regular rhythm and normal heart sounds.  Pulmonary/Chest: Effort normal and breath sounds normal. No respiratory distress.  Abdominal: Soft. Bowel sounds are normal. He exhibits no distension. There is tenderness. There is no rebound and no guarding.  Left upper quadrant tenderness to palpation. No peritoneal signs  Musculoskeletal: Normal range of motion. He exhibits no edema.  Neurological: He is alert and oriented to person, place, and time.  Skin: Skin is dry. No erythema.  Psychiatric: He has a normal mood and affect. Judgment and thought content normal.  Vitals reviewed.   GI:  Lab Results: Recent Labs    08/07/17 1212 08/08/17 0531  WBC 6.6 5.8  HGB 15.1 14.3  HCT 45.4 43.5  PLT 189 181   BMET Recent Labs    08/07/17 1212 08/08/17 0531  NA 135 137  K 4.8 4.4  CL 100* 107  CO2 27 23  GLUCOSE 101* 78  BUN 28* 22*  CREATININE 1.37* 1.09  CALCIUM 9.2 8.7*   LFT Recent Labs    08/08/17 0531  PROT 5.4*  ALBUMIN 2.9*  AST 393*  ALT 966*  ALKPHOS 523*  BILITOT 5.5*   PT/INR No results for input(s): LABPROT, INR in the last 72 hours.   Studies/Results: Dg Chest 2 View  Result Date: 08/07/2017 CLINICAL DATA:  83 year old male with diarrhea, lower extremity weakness for several days. EXAM: CHEST  2 VIEW COMPARISON:  07/06/2017 and earlier. FINDINGS: Semi upright AP and lateral views of the chest. Stable somewhat large lung volumes, upper lobe predominant emphysema better demonstrated by CT in 2017. The lungs are clear. No pneumothorax or pleural effusion. Prior CABG. Stable cardiac size and mediastinal contours. No acute osseous abnormality identified. Negative visible bowel gas pattern. No pneumoperitoneum. IMPRESSION: No  acute cardiopulmonary abnormality. Emphysema (ICD10-J43.9). Electronically Signed   By: Genevie Ann M.D.   On: 08/07/2017 10:18   Ct Abdomen Pelvis W Contrast  Result Date: 08/07/2017 CLINICAL DATA:  Diarrhea and weakness.  Loss of weight. EXAM: CT ABDOMEN AND PELVIS WITH CONTRAST TECHNIQUE: Multidetector CT imaging  of the abdomen and pelvis was performed using the standard protocol following bolus administration of intravenous contrast. CONTRAST:  115mL ISOVUE-300 IOPAMIDOL (ISOVUE-300) INJECTION 61% COMPARISON:  06/26/2017 FINDINGS: Lower chest: No acute abnormality. Hepatobiliary: There is diffuse intra scratch moderate to marked intrahepatic biliary dilatation. The common bile duct is increased in caliber measuring up to 2.1 cm. Stable cyst within segment 8 of the liver measuring 1.3 cm. Pancreas: There is mild increase caliber of the pancreatic duct measuring 4 mm. Suspicious area of relative hypoenhancement within the head of pancreas measuring 2.2 by 1.9 cm, image 24 of series 4. Cannot rule out primary pancreatic neoplasm. Spleen: Normal in size without focal abnormality. Adrenals/Urinary Tract: The adrenal glands are normal. There is new left-sided mild hydronephrosis. No obstructing stone or mass. Urinary bladder is unremarkable. Stomach/Bowel: The stomach appears normal. No dilated loops of small bowel identified. The appendix is visualized and appears normal. No pathologic dilatation of the colon. Vascular/Lymphatic: Aortic atherosclerosis. No aneurysm. No adenopathy within the upper abdomen. No pelvic or inguinal adenopathy. Reproductive: Prostate gland appears enlarged. Other: No free fluid or fluid collections. No peritoneal nodularity or mass. Musculoskeletal: Scoliosis deformity is convex towards the right. There is multi level degenerative disc disease noted. IMPRESSION: 1. Interval development of biliary dilatation and increase caliber of the pancreatic duct. Underlying head of pancreas lesion is  suspected. Recommend further evaluation with contrast enhanced MRCP. 2. No evidence for liver metastasis or upper abdominal adenopathy. 3. There is left-sided hydronephrosis. New from previous exam. Etiology indeterminate. No obstructing stone or mass identified. Urologic consultation is advised. 4.  Aortic Atherosclerosis (ICD10-I70.0). 5. Prostate gland enlargement. Electronically Signed   By: Kerby Moors M.D.   On: 08/07/2017 15:20   Mr 3d Recon At Scanner  Result Date: 08/07/2017 CLINICAL DATA:  Biliary dilatation with suspected pancreatic mass on CT. EXAM: MRI ABDOMEN WITHOUT AND WITH CONTRAST (INCLUDING MRCP) TECHNIQUE: Multiplanar multisequence MR imaging of the abdomen was performed both before and after the administration of intravenous contrast. Heavily T2-weighted images of the biliary and pancreatic ducts were obtained, and three-dimensional MRCP images were rendered by post processing. CONTRAST:  73mL MULTIHANCE GADOBENATE DIMEGLUMINE 529 MG/ML IV SOLN COMPARISON:  CT 08/07/2017.  CT scan 04/14/2016. FINDINGS: Lower chest:  Unremarkable. Hepatobiliary: Multiple hepatic cysts again noted seen on prior CT scans. There is marked intra and extrahepatic biliary duct dilatation. Common duct abruptly terminates at the level of the head of the pancreas. Gallbladder nondistended. No stones evident within the gallbladder lumen. Pancreas: There is no substantial dilatation of the main pancreatic duct although it does abruptly terminates in the head of the pancreas, in the same region as the extrahepatic biliary ducts. Although this study is markedly degraded by breathing motion, there appears to be a subtle mass in the head of the pancreas measuring about 2.5 x 1.9 cm (see images 22 and 23 of series 3. This same area is visible on postcontrast image 39 of series 1102. The lesion generates mass-effect on the distal splenic vein and portal splenic confluence. Spleen: No splenomegaly. No focal mass lesion.  Adrenals/Urinary Tract: No adrenal nodule or mass. Multiple cysts are noted in the right kidney. There is mild left hydronephrosis with multiple cysts in the left kidney. Stomach/Bowel: No gross bowel dilatation. Vascular/Lymphatic: Portal vein is patent. Postcontrast imaging has motion artifact overlie the portal vein training linear area of signal void, but there is no evidence for portal vein thrombus on precontrast imaging today or on the CT scan  was performed about 90 minutes ago. Other: No substantial intraperitoneal free fluid. Musculoskeletal: Focal area of enhancement identified posterior aspect of the L3 vertebral body (image 54 series 1104). IMPRESSION: 1. Markedly degraded study secondary to marked respiratory motion from patient inability to reproducibly breath hold. 2. Apparent 2.5 x 1.9 cm subtle lesion in the head of the pancreas. The common duct abruptly terminates at the level of this lesion as does the nondilated pancreatic duct. There is mass-effect on the portal splenic confluence in the same region. Evaluation of fat planes around the celiac axis and SMA cannot be reliably performed due to the extensive motion degradation. EUS recommended to further evaluate. 3. No definite liver metastases although motion artifact could of obscure small liver lesions. 4. Artifact overlying the portal vein on postcontrast imaging likely due to the extensive respiratory motion degradation of this exam. Portal vein appears patent on precontrast imaging and on the CT scan performed less than 2 hours ago. 5. Focal enhancement in the L3 vertebral body, not definitively characterized. Attention on follow-up recommended as metastatic lesion not excluded. Electronically Signed   By: Misty Stanley M.D.   On: 08/07/2017 17:46   Mr Abdomen Mrcp Moise Boring Contast  Result Date: 08/07/2017 CLINICAL DATA:  Biliary dilatation with suspected pancreatic mass on CT. EXAM: MRI ABDOMEN WITHOUT AND WITH CONTRAST (INCLUDING MRCP)  TECHNIQUE: Multiplanar multisequence MR imaging of the abdomen was performed both before and after the administration of intravenous contrast. Heavily T2-weighted images of the biliary and pancreatic ducts were obtained, and three-dimensional MRCP images were rendered by post processing. CONTRAST:  6mL MULTIHANCE GADOBENATE DIMEGLUMINE 529 MG/ML IV SOLN COMPARISON:  CT 08/07/2017.  CT scan 04/14/2016. FINDINGS: Lower chest:  Unremarkable. Hepatobiliary: Multiple hepatic cysts again noted seen on prior CT scans. There is marked intra and extrahepatic biliary duct dilatation. Common duct abruptly terminates at the level of the head of the pancreas. Gallbladder nondistended. No stones evident within the gallbladder lumen. Pancreas: There is no substantial dilatation of the main pancreatic duct although it does abruptly terminates in the head of the pancreas, in the same region as the extrahepatic biliary ducts. Although this study is markedly degraded by breathing motion, there appears to be a subtle mass in the head of the pancreas measuring about 2.5 x 1.9 cm (see images 22 and 23 of series 3. This same area is visible on postcontrast image 39 of series 1102. The lesion generates mass-effect on the distal splenic vein and portal splenic confluence. Spleen: No splenomegaly. No focal mass lesion. Adrenals/Urinary Tract: No adrenal nodule or mass. Multiple cysts are noted in the right kidney. There is mild left hydronephrosis with multiple cysts in the left kidney. Stomach/Bowel: No gross bowel dilatation. Vascular/Lymphatic: Portal vein is patent. Postcontrast imaging has motion artifact overlie the portal vein training linear area of signal void, but there is no evidence for portal vein thrombus on precontrast imaging today or on the CT scan was performed about 90 minutes ago. Other: No substantial intraperitoneal free fluid. Musculoskeletal: Focal area of enhancement identified posterior aspect of the L3 vertebral  body (image 54 series 1104). IMPRESSION: 1. Markedly degraded study secondary to marked respiratory motion from patient inability to reproducibly breath hold. 2. Apparent 2.5 x 1.9 cm subtle lesion in the head of the pancreas. The common duct abruptly terminates at the level of this lesion as does the nondilated pancreatic duct. There is mass-effect on the portal splenic confluence in the same region. Evaluation of fat planes  around the celiac axis and SMA cannot be reliably performed due to the extensive motion degradation. EUS recommended to further evaluate. 3. No definite liver metastases although motion artifact could of obscure small liver lesions. 4. Artifact overlying the portal vein on postcontrast imaging likely due to the extensive respiratory motion degradation of this exam. Portal vein appears patent on precontrast imaging and on the CT scan performed less than 2 hours ago. 5. Focal enhancement in the L3 vertebral body, not definitively characterized. Attention on follow-up recommended as metastatic lesion not excluded. Electronically Signed   By: Misty Stanley M.D.   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 - Abnormal LFTs with jaundice. Probably from CBD obstruction from mass lesion. MRI showed abrupt termination of CBD at the level of lesion. Other explanation would be ischemic hepatitis given patient's weakness and dizziness he might had an episode of hypotension. - Recent mesenteric vasculitis. Was being treated with  Prednisone - History of atrial fibrillation. On Eliquis   Recommendations ---------------------------- - I will discuss with my partner Dr. Paulita Fujita for possible EUS  - If there is no improvement in liver test or if he developed worsening jaundice, he may need ERCP with possible stent placement. - Follow CA-19-9. - ok  to have diet from GI standpoint as  We are  not planning for any procedure  today. His anticoagulation may need to be on hold for 2 days prior to procedure.  - GI will follow   LOS: 1 day   Otis Brace  MD, FACP 08/08/2017, 9:45 AM  Contact #  [phone]

## 2017-08-09 LAB — COMPREHENSIVE METABOLIC PANEL
ALBUMIN: 3.1 g/dL — AB (ref 3.5–5.0)
ALT: 790 U/L — AB (ref 17–63)
AST: 276 U/L — AB (ref 15–41)
Alkaline Phosphatase: 560 U/L — ABNORMAL HIGH (ref 38–126)
Anion gap: 7 (ref 5–15)
BUN: 14 mg/dL (ref 6–20)
CHLORIDE: 107 mmol/L (ref 101–111)
CO2: 24 mmol/L (ref 22–32)
CREATININE: 0.76 mg/dL (ref 0.61–1.24)
Calcium: 9 mg/dL (ref 8.9–10.3)
GFR calc Af Amer: >60 mL/min (ref >60)
GFR calc non Af Amer: >60 mL/min (ref >60)
GLUCOSE: 73 mg/dL (ref 65–99)
Potassium: 4.3 mmol/L (ref 3.5–5.1)
SODIUM: 138 mmol/L (ref 135–145)
Total Bilirubin: 7.8 mg/dL — ABNORMAL HIGH (ref 0.3–1.2)
Total Protein: 5.9 g/dL — ABNORMAL LOW (ref 6.5–8.1)

## 2017-08-09 LAB — CBC WITH DIFFERENTIAL/PLATELET
BASOS ABS: 0 10*3/uL (ref 0.0–0.1)
BASOS PCT: 0 %
EOS ABS: 0 10*3/uL (ref 0.0–0.7)
EOS PCT: 0 %
HCT: 40.7 % (ref 39.0–52.0)
Hemoglobin: 13.8 g/dL (ref 13.0–17.0)
LYMPHS PCT: 33 %
Lymphs Abs: 1.5 10*3/uL (ref 0.7–4.0)
MCH: 30 pg (ref 26.0–34.0)
MCHC: 33.9 g/dL (ref 30.0–36.0)
MCV: 88.5 fL (ref 78.0–100.0)
Monocytes Absolute: 0.4 10*3/uL (ref 0.1–1.0)
Monocytes Relative: 9 %
Neutro Abs: 2.6 10*3/uL (ref 1.7–7.7)
Neutrophils Relative %: 58 %
PLATELETS: 190 10*3/uL (ref 150–400)
RBC: 4.6 MIL/uL (ref 4.22–5.81)
RDW: 19.1 % — ABNORMAL HIGH (ref 11.5–15.5)
WBC: 4.5 10*3/uL (ref 4.0–10.5)

## 2017-08-09 LAB — CANCER ANTIGEN 19-9: CAN 19-9: 17514 U/mL — AB (ref 0–35)

## 2017-08-09 LAB — MAGNESIUM: Magnesium: 1.7 mg/dL (ref 1.7–2.4)

## 2017-08-09 MED ORDER — MORPHINE SULFATE (PF) 4 MG/ML IV SOLN
2.0000 mg | INTRAVENOUS | Status: DC | PRN
  Administered 2017-08-09 – 2017-08-16 (×17): 2 mg via INTRAVENOUS
  Filled 2017-08-09 (×17): qty 1

## 2017-08-09 MED ORDER — TAMSULOSIN HCL 0.4 MG PO CAPS
0.4000 mg | ORAL_CAPSULE | Freq: Every day | ORAL | Status: DC
  Administered 2017-08-09 – 2017-08-10 (×2): 0.4 mg via ORAL
  Filled 2017-08-09 (×3): qty 1

## 2017-08-09 MED ORDER — CEFAZOLIN SODIUM-DEXTROSE 2-4 GM/100ML-% IV SOLN
2.0000 g | INTRAVENOUS | Status: AC
  Administered 2017-08-10: 2 g via INTRAVENOUS

## 2017-08-09 MED ORDER — METOPROLOL TARTRATE 25 MG PO TABS
25.0000 mg | ORAL_TABLET | Freq: Two times a day (BID) | ORAL | Status: DC
  Administered 2017-08-09 – 2017-08-10 (×4): 25 mg via ORAL
  Filled 2017-08-09 (×5): qty 1

## 2017-08-09 NOTE — Progress Notes (Signed)
[facility] Progress Note  Alexander Farley 83 y.o. 09/30/1932  CC:  Pancreatic mass, abnormal LFTs   Subjective: Patient continues to have a left upper quadrant pain. Denied nausea or vomiting. Denied diarrhea or constipation.  ROS : Negative for chest pain and shortness of breath   Objective: Vital signs in last 24 hours: Vitals:   08/09/17 0628 08/09/17 1240  BP: (!) 159/104 124/74  Pulse: 73 (!) 154  Resp: 16 (!) 21  Temp: 97.9 F (36.6 C) 98.9 F (37.2 C)  SpO2: 100% 100%    Physical Exam:  General:  Frail-appearing. Not in acute distress   Head:  Normocephalic, without obvious abnormality, atraumatic  Eyes:  , EOM's intact,   Lungs:   Clear to auscultation bilaterally, respirations unlabored  Heart:  Regular rate and rhythm, S1, S2 normal  Abdomen:   Soft, left upper quadrant discomfort to palpation , bowel sounds present, no peritoneal signs   Extremities: Extremities normal, atraumatic, no  edema  Pulses: 2+ and symmetric    Lab Results: Recent Labs    08/08/17 0531 08/09/17 0531  NA 137 138  K 4.4 4.3  CL 107 107  CO2 23 24  GLUCOSE 78 73  BUN 22* 14  CREATININE 1.09 0.76  CALCIUM 8.7* 9.0  MG  --  1.7   Recent Labs    08/08/17 0531 08/09/17 0531  AST 393* 276*  ALT 966* 790*  ALKPHOS 523* 560*  BILITOT 5.5* 7.8*  PROT 5.4* 5.9*  ALBUMIN 2.9* 3.1*   Recent Labs    08/07/17 1212 08/08/17 0531 08/09/17 0531  WBC 6.6 5.8 4.5  NEUTROABS 4.1  --  2.6  HGB 15.1 14.3 13.8  HCT 45.4 43.5 40.7  MCV 89.4 89.1 88.5  PLT 189 181 190   No results for input(s): LABPROT, INR in the last 72 hours.    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 - Abnormal LFTs with jaundice. Probably from CBD obstruction from mass lesion. MRI showed abrupt termination of CBD at the level of lesion. Other explanation would be ischemic hepatitis given patient's weakness and dizziness he might had an episode  of hypotension. - Recent mesenteric vasculitis. Was being treated with  Prednisone - History of atrial fibrillation.  Eliquis  on hold since admission  Recommendations ---------------------------- -  Continue to hold anticoagulation. Patient has significantly elevated CA-19-9 and now with worsening jaundice. Discussed with Dr. Paulita Fujita. Unfortunately we will not able to get EUS in a timely fashion.  - Plan for ERCP with possible stent placement on Friday. - Continue current diet. - Monitor LFTs - GI will follow      Otis Brace MD, [place] 08/09/2017, 1:03 PM  Contact #  [phone]
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
## 2017-08-09 NOTE — Progress Notes (Signed)
Patient ID: Alexander Farley, male   DOB: Jun 11, 1933, 83 y.o.   MRN: [id number]    Mr. Pruss has continued to have some left sided pain.   I have him scheduled for cystoscopy and left retrograde with possible stent tomorrow.  I reviewed the risks with the patient and his wife.    ROS: -f/c, -n/v  .BP 124/86 (BP Location: Left Arm)   Pulse 72   Temp 98.6 F (37 C) (Oral)   Resp 18   Ht 5\' 9"  (1.753 m)   Wt 54.8 kg (120 lb 12.8 oz)   SpO2 99%   BMI 17.84 kg/m   I have reviewed his recent labs and notes.   GI intervention scheduled for Friday.  Assess:  Left hydro with pain.  Plan:  Cystoscopy with left RTG and stent on 12/13.

## 2017-08-09 NOTE — H&P (View-Only) (Signed)
Patient ID: Alexander Farley, male   DOB: 02-20-1933, 83 y.o.   MRN: [id number]    Mr. Kuenzi has continued to have some left sided pain.   I have him scheduled for cystoscopy and left retrograde with possible stent tomorrow.  I reviewed the risks with the patient and his wife.    ROS: -f/c, -n/v  .BP 124/86 (BP Location: Left Arm)   Pulse 72   Temp 98.6 F (37 C) (Oral)   Resp 18   Ht 5\' 9"  (1.753 m)   Wt 54.8 kg (120 lb 12.8 oz)   SpO2 99%   BMI 17.84 kg/m   I have reviewed his recent labs and notes.   GI intervention scheduled for Friday.  Assess:  Left hydro with pain.  Plan:  Cystoscopy with left RTG and stent on 12/13.

## 2017-08-10 ENCOUNTER — Inpatient Hospital Stay (HOSPITAL_COMMUNITY): Payer: Medicare Other | Admitting: Anesthesiology

## 2017-08-10 ENCOUNTER — Encounter (HOSPITAL_COMMUNITY): Payer: Self-pay | Admitting: Certified Registered"

## 2017-08-10 ENCOUNTER — Inpatient Hospital Stay (HOSPITAL_COMMUNITY): Payer: Medicare Other

## 2017-08-10 ENCOUNTER — Encounter (HOSPITAL_COMMUNITY)
Admission: EM | Disposition: A | Discharge status: Hospice | Payer: Self-pay | Source: Home / Self Care | Attending: Internal Medicine

## 2017-08-10 HISTORY — PX: CYSTOSCOPY W/ URETERAL STENT PLACEMENT: SHX1429

## 2017-08-10 LAB — COMPREHENSIVE METABOLIC PANEL
ALBUMIN: 2.5 g/dL — AB (ref 3.5–5.0)
ALT: 567 U/L — ABNORMAL HIGH (ref 17–63)
AST: 194 U/L — AB (ref 15–41)
Alkaline Phosphatase: 512 U/L — ABNORMAL HIGH (ref 38–126)
Anion gap: 7 (ref 5–15)
BILIRUBIN TOTAL: 7.9 mg/dL — AB (ref 0.3–1.2)
BUN: 13 mg/dL (ref 6–20)
CHLORIDE: 108 mmol/L (ref 101–111)
CO2: 21 mmol/L — AB (ref 22–32)
Calcium: 8.4 mg/dL — ABNORMAL LOW (ref 8.9–10.3)
Creatinine, Ser: 1 mg/dL (ref 0.61–1.24)
GFR calc Af Amer: >60 mL/min (ref >60)
GFR calc non Af Amer: >60 mL/min (ref >60)
GLUCOSE: 92 mg/dL (ref 65–99)
POTASSIUM: 4.2 mmol/L (ref 3.5–5.1)
SODIUM: 136 mmol/L (ref 135–145)
TOTAL PROTEIN: 5 g/dL — AB (ref 6.5–8.1)

## 2017-08-10 LAB — CBC WITH DIFFERENTIAL/PLATELET
BASOS ABS: 0 10*3/uL (ref 0.0–0.1)
BASOS PCT: 1 %
EOS ABS: 0 10*3/uL (ref 0.0–0.7)
Eosinophils Relative: 1 %
HEMATOCRIT: 39.2 % (ref 39.0–52.0)
Hemoglobin: 13.1 g/dL (ref 13.0–17.0)
Lymphocytes Relative: 43 %
Lymphs Abs: 1.5 10*3/uL (ref 0.7–4.0)
MCH: 29.6 pg (ref 26.0–34.0)
MCHC: 33.4 g/dL (ref 30.0–36.0)
MCV: 88.5 fL (ref 78.0–100.0)
MONO ABS: 0.3 10*3/uL (ref 0.1–1.0)
Monocytes Relative: 9 %
NEUTROS ABS: 1.6 10*3/uL — AB (ref 1.7–7.7)
NEUTROS PCT: 46 %
Platelets: 164 10*3/uL (ref 150–400)
RBC: 4.43 MIL/uL (ref 4.22–5.81)
RDW: 19.4 % — AB (ref 11.5–15.5)
WBC: 3.5 10*3/uL — ABNORMAL LOW (ref 4.0–10.5)

## 2017-08-10 LAB — MAGNESIUM: Magnesium: 1.6 mg/dL — ABNORMAL LOW (ref 1.7–2.4)

## 2017-08-10 LAB — PROTIME-INR
INR: 1.15
Prothrombin Time: 14.6 seconds (ref 11.4–15.2)

## 2017-08-10 SURGERY — CYSTOSCOPY, WITH RETROGRADE PYELOGRAM AND URETERAL STENT INSERTION
Anesthesia: General | Laterality: Left

## 2017-08-10 MED ORDER — ONDANSETRON HCL 4 MG/2ML IJ SOLN
INTRAMUSCULAR | Status: DC | PRN
  Administered 2017-08-10: 4 mg via INTRAVENOUS

## 2017-08-10 MED ORDER — CEFAZOLIN SODIUM-DEXTROSE 2-4 GM/100ML-% IV SOLN
INTRAVENOUS | Status: AC
  Filled 2017-08-10: qty 100

## 2017-08-10 MED ORDER — DEXAMETHASONE SODIUM PHOSPHATE 10 MG/ML IJ SOLN
INTRAMUSCULAR | Status: DC | PRN
  Administered 2017-08-10: 5 mg via INTRAVENOUS

## 2017-08-10 MED ORDER — PROPOFOL 10 MG/ML IV BOLUS
INTRAVENOUS | Status: DC | PRN
  Administered 2017-08-10: 150 mg via INTRAVENOUS

## 2017-08-10 MED ORDER — MAGNESIUM SULFATE 2 GM/50ML IV SOLN
2.0000 g | Freq: Once | INTRAVENOUS | Status: AC
  Administered 2017-08-10: 2 g via INTRAVENOUS
  Filled 2017-08-10: qty 50

## 2017-08-10 MED ORDER — SODIUM CHLORIDE 0.9 % IR SOLN
Status: DC | PRN
  Administered 2017-08-10: 6000 mL

## 2017-08-10 MED ORDER — SODIUM CHLORIDE 0.9 % IV SOLN: INTRAVENOUS | Status: DC

## 2017-08-10 MED ORDER — FENTANYL CITRATE (PF) 100 MCG/2ML IJ SOLN: 25.0000 ug | INTRAMUSCULAR | Status: DC | PRN

## 2017-08-10 MED ORDER — LACTATED RINGERS IV SOLN
INTRAVENOUS | Status: DC | PRN
  Administered 2017-08-10: 14:00:00 via INTRAVENOUS

## 2017-08-10 MED ORDER — IOHEXOL 300 MG/ML  SOLN
INTRAMUSCULAR | Status: DC | PRN
  Administered 2017-08-10: 7 mL

## 2017-08-10 MED ORDER — PHENYLEPHRINE 40 MCG/ML (10ML) SYRINGE FOR IV PUSH (FOR BLOOD PRESSURE SUPPORT)
PREFILLED_SYRINGE | INTRAVENOUS | Status: AC
  Filled 2017-08-10: qty 10

## 2017-08-10 MED ORDER — ONDANSETRON HCL 4 MG/2ML IJ SOLN: 4.0000 mg | Freq: Once | INTRAMUSCULAR | Status: DC | PRN

## 2017-08-10 MED ORDER — LIDOCAINE 2% (20 MG/ML) 5 ML SYRINGE
INTRAMUSCULAR | Status: DC | PRN
  Administered 2017-08-10: 60 mg via INTRAVENOUS

## 2017-08-10 MED ORDER — ONDANSETRON HCL 4 MG/2ML IJ SOLN
INTRAMUSCULAR | Status: AC
  Filled 2017-08-10: qty 2

## 2017-08-10 MED ORDER — FENTANYL CITRATE (PF) 100 MCG/2ML IJ SOLN
INTRAMUSCULAR | Status: DC | PRN
  Administered 2017-08-10: 50 ug via INTRAVENOUS

## 2017-08-10 MED ORDER — PHENYLEPHRINE 40 MCG/ML (10ML) SYRINGE FOR IV PUSH (FOR BLOOD PRESSURE SUPPORT)
PREFILLED_SYRINGE | INTRAVENOUS | Status: DC | PRN
  Administered 2017-08-10: 200 ug via INTRAVENOUS
  Administered 2017-08-10: 120 ug via INTRAVENOUS
  Administered 2017-08-10: 200 ug via INTRAVENOUS

## 2017-08-10 MED ORDER — PHENYLEPHRINE HCL 10 MG/ML IJ SOLN
INTRAVENOUS | Status: DC | PRN
  Administered 2017-08-10: 75 ug/min via INTRAVENOUS

## 2017-08-10 MED ORDER — FENTANYL CITRATE (PF) 100 MCG/2ML IJ SOLN
INTRAMUSCULAR | Status: AC
  Filled 2017-08-10: qty 2

## 2017-08-10 MED ORDER — EPHEDRINE SULFATE-NACL 50-0.9 MG/10ML-% IV SOSY
PREFILLED_SYRINGE | INTRAVENOUS | Status: DC | PRN
  Administered 2017-08-10: 10 mg via INTRAVENOUS

## 2017-08-10 MED ORDER — PROPOFOL 10 MG/ML IV BOLUS
INTRAVENOUS | Status: AC
  Filled 2017-08-10: qty 20

## 2017-08-10 SURGICAL SUPPLY — 13 items
BAG URO CATCHER STRL LF (MISCELLANEOUS) ×3 IMPLANT
CATH URET 5FR 28IN OPEN ENDED (CATHETERS) IMPLANT
CLOTH BEACON ORANGE TIMEOUT ST (SAFETY) ×3 IMPLANT
COVER FOOTSWITCH UNIV (MISCELLANEOUS) ×3 IMPLANT
COVER SURGICAL LIGHT HANDLE (MISCELLANEOUS) ×3 IMPLANT
GLOVE SURG SS PI 8.0 STRL IVOR (GLOVE) IMPLANT
GOWN STRL REUS W/TWL XL LVL3 (GOWN DISPOSABLE) ×3 IMPLANT
GUIDEWIRE STR DUAL SENSOR (WIRE) ×3 IMPLANT
MANIFOLD NEPTUNE II (INSTRUMENTS) ×3 IMPLANT
PACK CYSTO (CUSTOM PROCEDURE TRAY) ×3 IMPLANT
STENT CONTOUR 6FRX26X.038 (STENTS) ×3 IMPLANT
TUBING CONNECTING 10 (TUBING) ×2 IMPLANT
TUBING CONNECTING 10' (TUBING) ×1

## 2017-08-10 NOTE — Discharge Instructions (Signed)
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
## 2017-08-10 NOTE — Progress Notes (Signed)
Patient was originally scheduled for an ERCP with possible stent placement tomorrow on 08/11/17. Due to anesthesia unavailability, the procedure will likely be scheduled on Saturday 08/12/17.  I was not able to see the patient today as he was off the floor for a planned cystoscopy and ureteral stent placement.  Ronnette Juniper, M.D.

## 2017-08-10 NOTE — Care Management Important Message (Signed)
Important Message  Patient Details  Name: Alexander Farley MRN: [id number] Date of Birth: 23-May-1933   Medicare Important Message Given:  Yes    Kerin Salen 08/10/2017, 12:18 [place] Message  Patient Details  Name: Alexander Farley MRN: [id number] Date of Birth: 01-29-1933   Medicare Important Message Given:  Yes    Kerin Salen 08/10/2017, 12:17 PM

## 2017-08-10 NOTE — Interval H&P Note (Signed)
History and Physical Interval Note:  Pt remains stable.  Will proceed with cysto and left stent today.   08/10/2017 1:04 PM  Alexander Farley  has presented today for surgery, with the diagnosis of LEFT HYDRONEPHROSIS  The various methods of treatment have been discussed with the patient and family. After consideration of risks, benefits and other options for treatment, the patient has consented to  Procedure(s): CYSTOSCOPY WITH RETROGRADE LEFT  STENT INSERTION (Left) as a surgical intervention .  The patient's history has been reviewed, patient examined, no change in status, stable for surgery.  I have reviewed the patient's chart and labs.  Questions were answered to the patient's satisfaction.     Irine Seal

## 2017-08-10 NOTE — Op Note (Signed)
Procedure: Cystoscopy with left retrograde pyelogram and interpretation, insertion of left double-J stent.  Preop diagnosis: Left hydronephrosis.  Postop diagnosis: Left hydronephrosis with left proximal stricture.  Surgeon: Dr. Irine Seal.  Anesthesia: General.  Drain: 6 French by 26 cm contour double-J stent.  Specimen: Urine from left renal pelvis for cytology.   EBL: None.  Complications: None.  Indications: Alexander Farley is an 83 year old male who was found on CT scan to have left hydronephrosis along with a pancreatic mass with biliary obstruction.  He has left flank pain and it was felt that cystoscopy with retrograde pyelogram and probable left stent insertion was indicated.  Procedure: Alexander Farley was taken to the operating room where he was given 2 g of Ancef and a general anesthetic was induced.  He was placed in the lithotomy position and fitted with PAS hose.  His perineum and genitalia were prepped with Betadine solution and he was draped in the usual sterile fashion.  Cystoscopy was performed using the 23 [place] scope and 30 degree lens.  Inspection revealed a normal urethra.  The external sphincter was intact.  The prostatic urethra was approximately 3-4 cm in length with bilobar hyperplasia and some degree of obstruction.  The bladder had mild trabeculation.  The mucosa was pale, but with distention there were some glomerulations.  The trigone was somewhat elevated centrally but the ureteral orifices were in their normal anatomic position.  The left ureteral orifice was cannulated with a 5 French opening catheter and contrast was instilled.  Left retrograde pyelogram demonstrated a normal caliber ureter to just below the UPJ where there was approximately 1-1/2 cm tight stricture that appeared consistent with extrinsic compression.  There was proximal dilation in the intrarenal collecting system.  After completion of the retrograde a sensor guidewire was advanced to the kidney  and the 5 French opening catheter was passed over the wire to the renal pelvis.  The wire was removed and there was noted to be a brisk hydronephrotic drip.  The urine was bloody.  Approximately 5 mL was collected for cytologic analysis.  The guidewire was then passed back to the kidney and the opening catheter was removed.  A 6 French by 26 cm contour double-J stent was inserted to the kidney under fluoroscopic guidance.  The wire was removed leaving good call in the kidney and a good coil in the bladder.  The bladder was drained and the cystoscope was removed.  The patient was taken down from the lithotomy position, his anesthetic was reversed and he was taken to the recovery room in stable condition.  There were no complications.

## 2017-08-10 NOTE — Anesthesia Procedure Notes (Signed)
Procedure Name: LMA Insertion Date/Time: 08/10/2017 1:32 PM Performed by: Cynda Familia, CRNA Pre-anesthesia Checklist: Patient identified, Emergency Drugs available, Suction available and Patient being monitored Patient Re-evaluated:Patient Re-evaluated prior to induction Oxygen Delivery Method: Circle System Utilized Preoxygenation: Pre-oxygenation with 100% oxygen Induction Type: IV induction Ventilation: Mask ventilation without difficulty LMA: LMA inserted LMA Size: 4.0 Tube type: Oral ( 20 cc air) Number of attempts: 1 Placement Confirmation: positive ETCO2 Tube secured with: Tape Dental Injury: Teeth and Oropharynx as per pre-operative assessment  Comments: Smooth IV induction Ellender-- LMA AM CRNA--- atraumatic--- teeth and mouth as preop--- biolat BS Ellender

## 2017-08-10 NOTE — Anesthesia Preprocedure Evaluation (Addendum)
Anesthesia Evaluation  Patient identified by MRN, date of birth, ID band Patient awake    Reviewed: Allergy & Precautions, NPO status , Patient's Chart, lab work & pertinent test results  Airway Mallampati: II  TM Distance: >3 FB     Dental  (+) Poor Dentition, Chipped, Dental Advisory Given   Pulmonary former smoker,    breath sounds clear to auscultation       Cardiovascular hypertension, Pt. on medications and Pt. on home beta blockers + angina (stable) + CAD and + Cardiac Stents  + dysrhythmias Atrial Fibrillation  Rhythm:Regular Rate:Normal  ECG: SR, rate 58  LV EF: 60% -   65%  Sees cardiologist   Neuro/Psych Anxiety    GI/Hepatic negative GI ROS, Neg liver ROS,   Endo/Other  negative endocrine ROS  Renal/GU negative Renal ROS   LEFT HYDRONEPHROSIS    Musculoskeletal   Abdominal   Peds  Hematology Dyslipidemia   Anesthesia Other Findings   Reproductive/Obstetrics                           Anesthesia Physical  Anesthesia Plan  ASA: IV  Anesthesia Plan: General   Post-op Pain Management:    Induction: Intravenous  PONV Risk Score and Plan: 3 and Ondansetron, Dexamethasone and Treatment may vary due to age or medical condition  Airway Management Planned: LMA  Additional Equipment:   Intra-op Plan:   Post-operative Plan: Extubation in OR  Informed Consent: I have reviewed the patients History and Physical, chart, labs and discussed the procedure including the risks, benefits and alternatives for the proposed anesthesia with the patient or authorized representative who has indicated his/her understanding and acceptance.   Dental advisory given  Plan Discussed with: CRNA  Anesthesia Plan Comments:       Anesthesia Quick Evaluation

## 2017-08-10 NOTE — Anesthesia Postprocedure Evaluation (Signed)
Anesthesia Post Note  Patient: DEVERE BREM  Procedure(s) Performed: CYSTOSCOPY WITH RETROGRADE LEFT  STENT INSERTION (Left )     Patient location during evaluation: PACU Anesthesia Type: General Level of consciousness: awake and alert Pain management: pain level controlled Vital Signs Assessment: post-procedure vital signs reviewed and stable Respiratory status: spontaneous breathing, nonlabored ventilation, respiratory function stable and patient connected to nasal cannula oxygen Cardiovascular status: blood pressure returned to baseline and stable Postop Assessment: no apparent nausea or vomiting Anesthetic complications: no    Last Vitals:  Vitals:   08/10/17 1445 08/10/17 1500  BP: 119/82 122/82  Pulse: 69 72  Resp: 12 14  Temp:  (!) 36.4 C  SpO2: 100% 100%    Last Pain:  Vitals:   08/10/17 1445  TempSrc:   PainSc: Asleep                 Taevyn Hausen P Lois Slagel

## 2017-08-10 NOTE — Progress Notes (Signed)
Patient ID: Alexander Farley, male   DOB: 10/22/1932, 83 y.o.   MRN: [id number]  He is doing well post left ureteral stenting with resolution of his left flank pain.  He is very pleased with the improvement.   .BP 122/82 (BP Location: Left Arm)   Pulse 72   Temp (!) 97.5 F (36.4 C)   Resp 14   Ht 5\' 9"  (1.753 m)   Wt 54.8 kg (120 lb 12.8 oz)   SpO2 100%   BMI 17.84 kg/m    I will check on him again first of the week.

## 2017-08-10 NOTE — Transfer of Care (Signed)
Immediate Anesthesia Transfer of Care Note  Patient: Alexander Farley  Procedure(s) Performed: CYSTOSCOPY WITH RETROGRADE LEFT  STENT INSERTION (Left )  Patient Location: PACU  Anesthesia Type:General  Level of Consciousness: sedated  Airway & Oxygen Therapy: Patient Spontanous Breathing and Patient connected to face mask oxygen  Post-op Assessment: Report given to RN and Post -op Vital signs reviewed and stable  Post vital signs: Reviewed and stable  Last Vitals:  Vitals:   08/10/17 0453 08/10/17 0912  BP: 127/78   Pulse: 79 98  Resp: 20   Temp: 37.1 C   SpO2: 99%     Last Pain:  Vitals:   08/10/17 1015  TempSrc:   PainSc: Asleep      Patients Stated Pain Goal: 3 (00/76/22 6333)  Complications: No apparent anesthesia complications

## 2017-08-10 NOTE — Anesthesia Procedure Notes (Signed)
Date/Time: 08/10/2017 1:59 PM Performed by: Cynda Familia, CRNA Oxygen Delivery Method: Simple face mask Placement Confirmation: breath sounds checked- equal and bilateral Dental Injury: Teeth and Oropharynx as per pre-operative assessment  Comments: Extubated to face mask

## 2017-08-11 ENCOUNTER — Inpatient Hospital Stay (HOSPITAL_COMMUNITY): Payer: Medicare Other

## 2017-08-11 ENCOUNTER — Inpatient Hospital Stay (HOSPITAL_COMMUNITY): Payer: Medicare Other | Admitting: Anesthesiology

## 2017-08-11 ENCOUNTER — Encounter (HOSPITAL_COMMUNITY)
Admission: EM | Disposition: A | Discharge status: Hospice | Payer: Self-pay | Source: Home / Self Care | Attending: Internal Medicine

## 2017-08-11 ENCOUNTER — Encounter (HOSPITAL_COMMUNITY): Payer: Self-pay | Admitting: Urology

## 2017-08-11 HISTORY — PX: ERCP: SHX5425

## 2017-08-11 LAB — COMPREHENSIVE METABOLIC PANEL
ALBUMIN: 2.4 g/dL — AB (ref 3.5–5.0)
ALT: 460 U/L — AB (ref 17–63)
AST: 189 U/L — AB (ref 15–41)
Alkaline Phosphatase: 517 U/L — ABNORMAL HIGH (ref 38–126)
Anion gap: 6 (ref 5–15)
BUN: 13 mg/dL (ref 6–20)
CHLORIDE: 109 mmol/L (ref 101–111)
CO2: 21 mmol/L — AB (ref 22–32)
Calcium: 8.2 mg/dL — ABNORMAL LOW (ref 8.9–10.3)
Creatinine, Ser: 0.6 mg/dL — ABNORMAL LOW (ref 0.61–1.24)
GFR calc Af Amer: >60 mL/min (ref >60)
GFR calc non Af Amer: >60 mL/min (ref >60)
Glucose, Bld: 152 mg/dL — ABNORMAL HIGH (ref 65–99)
POTASSIUM: 4.2 mmol/L (ref 3.5–5.1)
SODIUM: 136 mmol/L (ref 135–145)
Total Bilirubin: 8.1 mg/dL — ABNORMAL HIGH (ref 0.3–1.2)
Total Protein: 4.9 g/dL — ABNORMAL LOW (ref 6.5–8.1)

## 2017-08-11 LAB — CBC WITH DIFFERENTIAL/PLATELET
BASOS ABS: 0 10*3/uL (ref 0.0–0.1)
BASOS PCT: 0 %
EOS ABS: 0 10*3/uL (ref 0.0–0.7)
EOS PCT: 0 %
HCT: 36 % — ABNORMAL LOW (ref 39.0–52.0)
Hemoglobin: 12.1 g/dL — ABNORMAL LOW (ref 13.0–17.0)
LYMPHS PCT: 21 %
Lymphs Abs: 0.6 10*3/uL — ABNORMAL LOW (ref 0.7–4.0)
MCH: 29.5 pg (ref 26.0–34.0)
MCHC: 33.6 g/dL (ref 30.0–36.0)
MCV: 87.8 fL (ref 78.0–100.0)
MONO ABS: 0.2 10*3/uL (ref 0.1–1.0)
Monocytes Relative: 7 %
Neutro Abs: 2.2 10*3/uL (ref 1.7–7.7)
Neutrophils Relative %: 72 %
PLATELETS: 178 10*3/uL (ref 150–400)
RBC: 4.1 MIL/uL — AB (ref 4.22–5.81)
RDW: 19.5 % — AB (ref 11.5–15.5)
WBC: 3 10*3/uL — AB (ref 4.0–10.5)

## 2017-08-11 LAB — MAGNESIUM: Magnesium: 1.7 mg/dL (ref 1.7–2.4)

## 2017-08-11 SURGERY — ERCP, WITH INTERVENTION IF INDICATED
Anesthesia: General | Laterality: Left

## 2017-08-11 SURGERY — ERCP, WITH INTERVENTION IF INDICATED
Anesthesia: General

## 2017-08-11 MED ORDER — BOOST / RESOURCE BREEZE PO LIQD: 1.0000 | Freq: Three times a day (TID) | ORAL | Status: DC

## 2017-08-11 MED ORDER — TAMSULOSIN HCL 0.4 MG PO CAPS
0.4000 mg | ORAL_CAPSULE | Freq: Every day | ORAL | Status: DC
  Administered 2017-08-11 – 2017-08-19 (×9): 0.4 mg via ORAL
  Filled 2017-08-11 (×9): qty 1

## 2017-08-11 MED ORDER — ACETAMINOPHEN 325 MG PO TABS: 650.0000 mg | ORAL_TABLET | ORAL | Status: DC | PRN

## 2017-08-11 MED ORDER — GLUCAGON HCL RDNA (DIAGNOSTIC) 1 MG IJ SOLR
INTRAMUSCULAR | Status: DC | PRN
  Administered 2017-08-11: .5 mg via INTRAVENOUS

## 2017-08-11 MED ORDER — GLUCAGON HCL RDNA (DIAGNOSTIC) 1 MG IJ SOLR
INTRAMUSCULAR | Status: AC
  Filled 2017-08-11: qty 1

## 2017-08-11 MED ORDER — MULTIVITAMINS PO CAPS: 1.0000 | ORAL_CAPSULE | Freq: Every day | ORAL | Status: DC

## 2017-08-11 MED ORDER — LORAZEPAM 0.5 MG PO TABS
0.5000 mg | ORAL_TABLET | Freq: Two times a day (BID) | ORAL | Status: DC | PRN
  Administered 2017-08-11: 0.5 mg via ORAL
  Administered 2017-08-14 – 2017-08-19 (×2): 1 mg via ORAL
  Filled 2017-08-11 (×2): qty 2
  Filled 2017-08-11: qty 1

## 2017-08-11 MED ORDER — POTASSIUM CHLORIDE CRYS ER 10 MEQ PO TBCR: 10.0000 meq | EXTENDED_RELEASE_TABLET | Freq: Every day | ORAL | Status: DC

## 2017-08-11 MED ORDER — ONDANSETRON HCL 4 MG/2ML IJ SOLN
INTRAMUSCULAR | Status: DC | PRN
  Administered 2017-08-11: 4 mg via INTRAVENOUS

## 2017-08-11 MED ORDER — PROPOFOL 10 MG/ML IV BOLUS
INTRAVENOUS | Status: AC
  Filled 2017-08-11: qty 20

## 2017-08-11 MED ORDER — MISOPROSTOL 100 MCG PO TABS
100.0000 ug | ORAL_TABLET | Freq: Four times a day (QID) | ORAL | Status: DC
  Administered 2017-08-11 – 2017-08-19 (×27): 100 ug via ORAL
  Filled 2017-08-11 (×36): qty 1

## 2017-08-11 MED ORDER — FENTANYL CITRATE (PF) 250 MCG/5ML IJ SOLN
INTRAMUSCULAR | Status: DC | PRN
  Administered 2017-08-11 (×6): 50 ug via INTRAVENOUS

## 2017-08-11 MED ORDER — FENTANYL CITRATE (PF) 100 MCG/2ML IJ SOLN
INTRAMUSCULAR | Status: AC
  Filled 2017-08-11: qty 2

## 2017-08-11 MED ORDER — PROPOFOL 10 MG/ML IV BOLUS
INTRAVENOUS | Status: DC | PRN
  Administered 2017-08-11: 130 mg via INTRAVENOUS

## 2017-08-11 MED ORDER — CYANOCOBALAMIN 500 MCG PO TABS
500.0000 ug | ORAL_TABLET | Freq: Two times a day (BID) | ORAL | Status: DC
  Administered 2017-08-11 – 2017-08-19 (×16): 500 ug via ORAL
  Filled 2017-08-11 (×18): qty 1

## 2017-08-11 MED ORDER — INDOMETHACIN 50 MG RE SUPP
RECTAL | Status: DC | PRN
  Administered 2017-08-11: 100 mg via RECTAL

## 2017-08-11 MED ORDER — CIPROFLOXACIN IN D5W 400 MG/200ML IV SOLN
INTRAVENOUS | Status: AC
Start: 2017-08-11 — End: 2017-08-11
  Filled 2017-08-11: qty 200

## 2017-08-11 MED ORDER — POLYVINYL ALCOHOL 1.4 % OP SOLN: 1.0000 [drp] | OPHTHALMIC | Status: DC | PRN

## 2017-08-11 MED ORDER — ROSUVASTATIN CALCIUM 20 MG PO TABS
20.0000 mg | ORAL_TABLET | Freq: Every day | ORAL | Status: DC
  Administered 2017-08-11 – 2017-08-14 (×4): 20 mg via ORAL
  Filled 2017-08-11 (×4): qty 1

## 2017-08-11 MED ORDER — LISINOPRIL 10 MG PO TABS
10.0000 mg | ORAL_TABLET | Freq: Every day | ORAL | Status: DC
  Administered 2017-08-11: 10 mg via ORAL
  Filled 2017-08-11: qty 1

## 2017-08-11 MED ORDER — HYDRALAZINE HCL 20 MG/ML IJ SOLN: 10.0000 mg | Freq: Four times a day (QID) | INTRAMUSCULAR | Status: DC | PRN

## 2017-08-11 MED ORDER — ADULT MULTIVITAMIN W/MINERALS CH
1.0000 | ORAL_TABLET | Freq: Every day | ORAL | Status: DC
  Administered 2017-08-11 – 2017-08-19 (×8): 1 via ORAL
  Filled 2017-08-11 (×8): qty 1

## 2017-08-11 MED ORDER — FAMOTIDINE 20 MG PO TABS
20.0000 mg | ORAL_TABLET | Freq: Two times a day (BID) | ORAL | Status: DC
  Administered 2017-08-11 – 2017-08-12 (×2): 20 mg via ORAL
  Filled 2017-08-11 (×2): qty 1

## 2017-08-11 MED ORDER — SULFAMETHOXAZOLE-TRIMETHOPRIM 800-160 MG PO TABS
1.0000 | ORAL_TABLET | ORAL | Status: DC
  Administered 2017-08-12: 1 via ORAL
  Filled 2017-08-11: qty 1

## 2017-08-11 MED ORDER — LIDOCAINE 2% (20 MG/ML) 5 ML SYRINGE
INTRAMUSCULAR | Status: DC | PRN
  Administered 2017-08-11: 75 mg via INTRAVENOUS

## 2017-08-11 MED ORDER — INDOMETHACIN 50 MG RE SUPP
RECTAL | Status: AC
  Filled 2017-08-11: qty 2

## 2017-08-11 MED ORDER — LACTATED RINGERS IV SOLN
INTRAVENOUS | Status: DC
  Administered 2017-08-11: 1000 mL via INTRAVENOUS

## 2017-08-11 MED ORDER — FOLIC ACID 1 MG PO TABS
1.0000 mg | ORAL_TABLET | Freq: Every day | ORAL | Status: DC
  Administered 2017-08-11 – 2017-08-19 (×8): 1 mg via ORAL
  Filled 2017-08-11 (×8): qty 1

## 2017-08-11 MED ORDER — BOOST / RESOURCE BREEZE PO LIQD CUSTOM: 1.0000 | Freq: Three times a day (TID) | ORAL | Status: DC

## 2017-08-11 MED ORDER — PANTOPRAZOLE SODIUM 20 MG PO TBEC
20.0000 mg | DELAYED_RELEASE_TABLET | Freq: Every day | ORAL | Status: DC
  Administered 2017-08-11 – 2017-08-12 (×2): 20 mg via ORAL
  Filled 2017-08-11 (×2): qty 1

## 2017-08-11 MED ORDER — TRAMADOL HCL 50 MG PO TABS
50.0000 mg | ORAL_TABLET | Freq: Four times a day (QID) | ORAL | Status: DC | PRN
  Administered 2017-08-14: 50 mg via ORAL
  Filled 2017-08-11: qty 1

## 2017-08-11 MED ORDER — POLYETHYL GLYCOL-PROPYL GLYCOL 0.4-0.3 % OP SOLN: 1.0000 [drp] | Freq: Every day | OPHTHALMIC | Status: DC | PRN

## 2017-08-11 MED ORDER — METOPROLOL TARTRATE 25 MG PO TABS
25.0000 mg | ORAL_TABLET | Freq: Two times a day (BID) | ORAL | Status: DC
  Administered 2017-08-11 – 2017-08-15 (×9): 25 mg via ORAL
  Filled 2017-08-11 (×8): qty 1

## 2017-08-11 MED ORDER — SUCCINYLCHOLINE CHLORIDE 200 MG/10ML IV SOSY
PREFILLED_SYRINGE | INTRAVENOUS | Status: DC | PRN
  Administered 2017-08-11: 100 mg via INTRAVENOUS

## 2017-08-11 MED ORDER — DEXAMETHASONE SODIUM PHOSPHATE 10 MG/ML IJ SOLN
INTRAMUSCULAR | Status: DC | PRN
  Administered 2017-08-11: 5 mg via INTRAVENOUS

## 2017-08-11 MED ORDER — DIAZEPAM 5 MG PO TABS
10.0000 mg | ORAL_TABLET | Freq: Four times a day (QID) | ORAL | Status: DC | PRN
  Administered 2017-08-11 – 2017-08-19 (×3): 10 mg via ORAL
  Filled 2017-08-11 (×3): qty 2

## 2017-08-11 MED ORDER — NITROGLYCERIN 0.4 MG SL SUBL: 0.4000 mg | SUBLINGUAL_TABLET | SUBLINGUAL | Status: DC | PRN

## 2017-08-11 MED ORDER — CIPROFLOXACIN IN D5W 400 MG/200ML IV SOLN
400.0000 mg | Freq: Once | INTRAVENOUS | Status: AC
Start: 2017-08-11 — End: 2017-08-11
  Administered 2017-08-11: 400 mg via INTRAVENOUS

## 2017-08-11 MED ORDER — LISINOPRIL 10 MG PO TABS
10.0000 mg | ORAL_TABLET | Freq: Every day | ORAL | Status: DC
  Filled 2017-08-11: qty 1

## 2017-08-11 NOTE — Anesthesia Preprocedure Evaluation (Addendum)
Anesthesia Evaluation  Patient identified by MRN, date of birth, ID band Patient awake    Reviewed: Allergy & Precautions, NPO status , Patient's Chart, lab work & pertinent test results  History of Anesthesia Complications Negative for: history of anesthetic complications  Airway Mallampati: II  TM Distance: >3 FB     Dental  (+) Poor Dentition, Chipped, Dental Advisory Given   Pulmonary former smoker,    breath sounds clear to auscultation       Cardiovascular hypertension, Pt. on medications and Pt. on home beta blockers + angina (stable) + CAD and + Cardiac Stents  + dysrhythmias Atrial Fibrillation  Rhythm:Regular Rate:Normal  ECG: SR, rate 58  LV EF: 60% -   65%   hx stent to LAD;  b. LHC (2/11):  LAD with 70 and 80% ISR, D1 90%, CFX 30-40%, RCA 30-40% >>> CABG (L-LAD, S-OM)   Neuro/Psych Anxiety    GI/Hepatic negative GI ROS, Neg liver ROS,   Endo/Other  negative endocrine ROS  Renal/GU negative Renal ROS   LEFT HYDRONEPHROSIS    Musculoskeletal   Abdominal   Peds  Hematology Dyslipidemia   Anesthesia Other Findings   Reproductive/Obstetrics                            Anesthesia Physical  Anesthesia Plan  ASA: IV  Anesthesia Plan: General   Post-op Pain Management:    Induction: Intravenous  PONV Risk Score and Plan: 3 and Ondansetron, Dexamethasone and Treatment may vary due to age or medical condition  Airway Management Planned: Oral ETT  Additional Equipment:   Intra-op Plan:   Post-operative Plan: Extubation in OR  Informed Consent: I have reviewed the patients History and Physical, chart, labs and discussed the procedure including the risks, benefits and alternatives for the proposed anesthesia with the patient or authorized representative who has indicated his/her understanding and acceptance.   Dental advisory given  Plan Discussed with: CRNA and  Anesthesiologist  Anesthesia Plan Comments:       Anesthesia Quick Evaluation

## 2017-08-11 NOTE — Op Note (Addendum)
[facility] Patient Name: Dhairya Corales Procedure Date: 08/11/2017 MRN: [id number] Attending MD: Ronnette Juniper , MD Date of Birth: 06-23-1933 CSN: [id number] Age: 83 Admit Type: Inpatient Procedure:                ERCP Indications:              Elevated liver enzymes, Tumor of the head of                            pancreas Providers:                Ronnette Juniper, MD, Carolynn Comment RN, RN, Cletis Athens, Technician, Otis Brace, MD Referring MD:              Medicines:                Monitored Anesthesia Care Complications:            No immediate complications. Estimated Blood Loss:     Estimated blood loss: none. Procedure:                Pre-Anesthesia Assessment:                           - Prior to the procedure, a History and Physical                            was performed, and patient medications and                            allergies were reviewed. The patient's tolerance of                            previous anesthesia was also reviewed. The risks                            and benefits of the procedure and the sedation                            options and risks were discussed with the patient.                            All questions were answered, and informed consent                            was obtained. Prior Anticoagulants: The patient has                            taken heparin, last dose was 1 day prior to                            procedure. ASA Grade Assessment: III - A patient  with severe systemic disease. After reviewing the                            risks and benefits, the patient was deemed in                            satisfactory condition to undergo the procedure.                           After obtaining informed consent, the scope was                            passed under direct vision. Throughout the                            procedure, the patient's blood pressure,  pulse, and                            oxygen saturations were monitored continuously. The                            DV-7616WV [id number] scope was introduced through the                            mouth, and used to inject contrast into and used to                            inject contrast into the bile duct. The ERCP was                            technically difficult and complex due to abnormal                            anatomy. Successful completion of the procedure was                            aided by changing the patient's position. The                            patient tolerated the procedure well. Scope In: Scope Out: Findings:      The scout film was normal. The esophagus was successfully intubated       under direct vision. The duodenoscope could not be advanced beyond the       distal duodenum into the gastric cavity due to a benign-appearing       stenosis.It was exchanged with a forward-viewing adult gastroscope. CRE       balloon of 12/13.5/15 mm was used, balloon dilatation was performed in       the distal esophagus with a 15 mm balloon for 2 consecutive minutes,       postdilatation the adult gastroscope could be advanced into the gastric       cavity.      The gastric cavity appeared to have an abnormal anatomy, was J-shaped       and significant  looping was encountered.      Retroflexion was unremarkable.      Passage into the pyloric channel and then the duodenal bulb and duodenum       was challenging due to significant looping.      The mucosa of the antral area appeared nodular.      The duodenal bulb and duodenum appeared unremarkable.      The duodenal scope was eventually advanced into the second portion of       the duodenum post dilatation of the distal esophagus.      Positioning was challenging and patient had to be switched from prone       position to left lateral and eventually prone position again.      Pancreatic duct was cannulated once however  dye was never injected into       the pancreatic duct.      The bile duct was cannulated with the sphincterotome, however the wire       kept coiling from the cystic duct into the gallbladder .      An attempt was made to advance another catheter loaded with wire into       the proximal biliary system by double wire technique .This was not       successful.      Some amount of resistance was felt in the area of cystic duct which       precluded advancement of wire as well as catheter into the proximal       biliary system. Contrast was injected. I personally interpreted the bile       duct images. The flow of contrast through the ducts was poor. Image       quality was suboptimal. An attempt at cholangiogram revealed that the       dye filled the gallbladder however the biliary tree and common bile duct       did not opacify . Biliary sphincterotomy was performed and good flow of       bile was noted. Despite multiple attempts the wire or the catheter could       not be advanced from the common bile duct into the proximal biliary tree.      At this point, a decision was made to leave a plastic biliary stent 10       French X 5 cm in the lower third of the main bile duct. Impression:               - A localized biliary stricture was found.                           - A biliary sphincterotomy was performed.                           - One plastic stent was placed into the common bile                            duct.                           Distal esophageal stricture, dilated with 15 mm                            balloon.  Moderate Sedation:      Patient did not receive moderate sedation for this procedure, but       instead received monitored anesthesia care. Recommendation:           - Clear liquid diet today.                           - Check liver enzymes (AST, ALT, alkaline                            phosphatase, bilirubin) in the morning.                           - Will need EUS  and ERCP wtih metallic stent                            placement. Procedure Code(s):        --- Professional ---                           [phone], Endoscopic retrograde                            cholangiopancreatography (ERCP); with placement of                            endoscopic stent into biliary or pancreatic duct,                            including pre- and post-dilation and guide wire                            passage, when performed, including sphincterotomy,                            when performed, each stent                           81191, Endoscopic catheterization of the biliary                            ductal system, radiological supervision and                            interpretation Diagnosis Code(s):        --- Professional ---                           K83.1, Obstruction of bile duct                           R74.8, Abnormal levels of other serum enzymes                           D49.0, Neoplasm of unspecified behavior of  digestive system CPT copyright 2016 [employer]. All rights reserved. The codes documented in this report are preliminary and upon coder review may  be revised to meet current compliance requirements. Ronnette Juniper, MD 08/11/2017 12:34:23 PM This report has been signed electronically. Otis Brace, MD Number of Addenda: 0

## 2017-08-11 NOTE — Op Note (Signed)
Patient was scheduled for an ERCP because of abnormal liver enzymes from pancreatic head mass.  Passage of the duodenal scope was not possible due to distal esophageal stricture. It was dilated with a CRE balloon at 15 mm for 2 consecutive minutes. Significant looping was encountered due to a J-shaped stomach. Patient had to be positioned from prone to left lateral and prone position again. Cannulation was challenging. Wire and the catheter would continue to advance via the cystic duct into the gallbladder possibly due to stenosis in proximal or mid CBD from extrinsic mass. A sphincterotomy was performed, plastic stent 10 French by 5 cm left in the distal CBD. Pancreatic duct was cannulated once but never injected.  Recommend clear liquid diet Monitor LFTs in a.m. Patient will need an EUS for FNA of pancreatic head mass and a repeat ERCP for metallic stent placement.  Ronnette Juniper, M.D.

## 2017-08-11 NOTE — Anesthesia Postprocedure Evaluation (Signed)
Anesthesia Post Note  Patient: [name]  Procedure(s) Performed: ENDOSCOPIC RETROGRADE CHOLANGIOPANCREATOGRAPHY (ERCP) (Left )     Patient location during evaluation: PACU Anesthesia Type: General Level of consciousness: sedated Pain management: pain level controlled Vital Signs Assessment: post-procedure vital signs reviewed and stable Respiratory status: spontaneous breathing and respiratory function stable Cardiovascular status: stable Postop Assessment: no apparent nausea or vomiting Anesthetic complications: no    Last Vitals:  Vitals:   08/11/17 1250 08/11/17 1301  BP: (!) 190/112 (!) 174/102  Pulse: 74   Resp: 14   Temp:    SpO2: 100%     Last Pain:  Vitals:   08/11/17 1226  TempSrc: Axillary  PainSc:                  Nakyia Dau DANIEL
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
## 2017-08-11 NOTE — Progress Notes (Signed)
[facility] Progress Note  Alexander Farley 83 y.o. 10/27/1932  CC:  Pancreatic mass, abnormal LFTs   Subjective: No acute GI issues today. Underwent left ureteral stent placement with improvement in the left flank pain. Denied nausea or vomiting.  ROS : Negative for chest pain and shortness of breath. Positive for weakness   Objective: Vital signs in last 24 hours: Vitals:   08/11/17 0439 08/11/17 0926  BP: 122/84 (!) 149/87  Pulse: 63 73  Resp: 16 20  Temp: 98 F (36.7 C) 98.2 F (36.8 C)  SpO2: 100% 100%    Physical Exam:  General:  Frail-appearing. Not in acute distress   Head:  Normocephalic, without obvious abnormality, atraumatic  Eyes:  , EOM's intact, scleral icterus noted   Lungs:   Clear to auscultation bilaterally, respirations unlabored  Heart:  Regular rate and rhythm, S1, S2 normal  Abdomen:   Soft, nontender, nondistended , bowel sounds present, no peritoneal signs   Extremities: Extremities normal, atraumatic, no  edema  Pulses: 2+ and symmetric    Lab Results: Recent Labs    08/10/17 0413 08/11/17 0408  NA 136 136  K 4.2 4.2  CL 108 109  CO2 21* 21*  GLUCOSE 92 152*  BUN 13 13  CREATININE 1.00 0.60*  CALCIUM 8.4* 8.2*  MG 1.6* 1.7   Recent Labs    08/10/17 0413 08/11/17 0408  AST 194* 189*  ALT 567* 460*  ALKPHOS 512* 517*  BILITOT 7.9* 8.1*  PROT 5.0* 4.9*  ALBUMIN 2.5* 2.4*   Recent Labs    08/10/17 0413 08/11/17 0408  WBC 3.5* 3.0*  NEUTROABS 1.6* 2.2  HGB 13.1 12.1*  HCT 39.2 36.0*  MCV 88.5 87.8  PLT 164 178   Recent Labs    08/10/17 0413  LABPROT 14.6  INR 1.15      Assessment/Plan: - 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 MRI showed abrupt termination of CBD at the level of lesion.  - Recent mesenteric vasculitis. Was being treated with  Prednisone - History of atrial fibrillation.  Eliquis  on hold  since admission  Recommendations ---------------------------- -  ERCP today with possible metallic stent placement. Procedure, risk, benefits and alternatives discussed with patient and family in detail. Risks include but not limited to infection, bleeding, perforation and post-ERCP pancreatitis. Patient verbalized understanding.  - May need outpatient EUS for FNA for definitive diagnosis of pancreatic mass lesion. - GI will follow      Otis Brace MD, [place] 08/11/2017, 9:43 AM  Contact #  [phone]

## 2017-08-11 NOTE — Transfer of Care (Signed)
Immediate Anesthesia Transfer of Care Note  Patient: Alexander Farley  Procedure(s) Performed: ENDOSCOPIC RETROGRADE CHOLANGIOPANCREATOGRAPHY (ERCP) (Left )  Patient Location: PACU and Endoscopy Unit  Anesthesia Type:General  Level of Consciousness: awake, patient cooperative and responds to stimulation  Airway & Oxygen Therapy: Patient Spontanous Breathing and Patient connected to face mask oxygen  Post-op Assessment: Report given to RN and Post -op Vital signs reviewed and stable  Post vital signs: Reviewed and stable  Last Vitals:  Vitals:   08/11/17 0439 08/11/17 0926  BP: 122/84 (!) 149/87  Pulse: 63 73  Resp: 16 20  Temp: 36.7 C 36.8 C  SpO2: 100% 100%    Last Pain:  Vitals:   08/11/17 0926  TempSrc: Oral  PainSc: 6       Patients Stated Pain Goal: 2 (10/27/29 4388)  Complications: No apparent anesthesia complications

## 2017-08-11 NOTE — Brief Op Note (Signed)
08/07/2017 - 08/11/2017  12:15 PM  PATIENT:  Alexander Farley  83 y.o. male  PRE-OPERATIVE DIAGNOSIS:  CBD obstrcution from pancreatic mass  POST-OPERATIVE DIAGNOSIS:  * No post-op diagnosis entered *  PROCEDURE:  Procedure(s): ENDOSCOPIC RETROGRADE CHOLANGIOPANCREATOGRAPHY (ERCP) (Left)  SURGEON:  Surgeon(s) and Role:    Ronnette Juniper, MD - Primary  PHYSICIAN ASSISTANT:   ASSISTANTS: Autumn Goldsmith,RN, Hope parker, Tech  ANESTHESIA:   MAC  EBL:  10 mL   BLOOD ADMINISTERED:none  DRAINS: none   LOCAL MEDICATIONS USED:  NONE  SPECIMEN:  No Specimen  DISPOSITION OF SPECIMEN:  N/A  COUNTS:  YES  TOURNIQUET:  * No tourniquets in log *  DICTATION: .Dragon Dictation  PLAN OF CARE: Admit to inpatient   PATIENT DISPOSITION:  PACU - hemodynamically stable.   Delay start of Pharmacological VTE agent (>24hrs) due to surgical blood loss or risk of bleeding: yes

## 2017-08-11 NOTE — Anesthesia Procedure Notes (Signed)
Procedure Name: Intubation Date/Time: 08/11/2017 10:13 AM Performed by: Lollie Sails, CRNA Pre-anesthesia Checklist: Patient identified, Emergency Drugs available, Suction available, Patient being monitored and Timeout performed Patient Re-evaluated:Patient Re-evaluated prior to induction Oxygen Delivery Method: Circle system utilized Preoxygenation: Pre-oxygenation with 100% oxygen Induction Type: IV induction Ventilation: Mask ventilation without difficulty Laryngoscope Size: Miller and 3 Grade View: Grade I Tube type: Oral Tube size: 8.0 mm Number of attempts: 1 Airway Equipment and Method: Stylet Placement Confirmation: ETT inserted through vocal cords under direct vision,  positive ETCO2 and breath sounds checked- equal and bilateral Secured at: 23 cm Tube secured with: Tape Dental Injury: Teeth and Oropharynx as per pre-operative assessment

## 2017-08-12 LAB — CBC WITH DIFFERENTIAL/PLATELET
BASOS ABS: 0 10*3/uL (ref 0.0–0.1)
BASOS PCT: 0 %
Eosinophils Absolute: 0 10*3/uL (ref 0.0–0.7)
Eosinophils Relative: 0 %
HEMATOCRIT: 36.6 % — AB (ref 39.0–52.0)
HEMOGLOBIN: 12.1 g/dL — AB (ref 13.0–17.0)
LYMPHS PCT: 19 %
Lymphs Abs: 1.3 10*3/uL (ref 0.7–4.0)
MCH: 29.2 pg (ref 26.0–34.0)
MCHC: 33.1 g/dL (ref 30.0–36.0)
MCV: 88.4 fL (ref 78.0–100.0)
MONOS PCT: 7 %
Monocytes Absolute: 0.5 10*3/uL (ref 0.1–1.0)
NEUTROS ABS: 5.1 10*3/uL (ref 1.7–7.7)
NEUTROS PCT: 74 %
Platelets: 231 10*3/uL (ref 150–400)
RBC: 4.14 MIL/uL — ABNORMAL LOW (ref 4.22–5.81)
RDW: 19.4 % — ABNORMAL HIGH (ref 11.5–15.5)
WBC: 6.9 10*3/uL (ref 4.0–10.5)

## 2017-08-12 LAB — COMPREHENSIVE METABOLIC PANEL
ALBUMIN: 2.5 g/dL — AB (ref 3.5–5.0)
ALK PHOS: 569 U/L — AB (ref 38–126)
ALT: 491 U/L — ABNORMAL HIGH (ref 17–63)
ANION GAP: 7 (ref 5–15)
AST: 298 U/L — ABNORMAL HIGH (ref 15–41)
BUN: 17 mg/dL (ref 6–20)
CHLORIDE: 108 mmol/L (ref 101–111)
CO2: 22 mmol/L (ref 22–32)
Calcium: 8.2 mg/dL — ABNORMAL LOW (ref 8.9–10.3)
Creatinine, Ser: 0.74 mg/dL (ref 0.61–1.24)
GFR calc non Af Amer: >60 mL/min (ref >60)
Glucose, Bld: 106 mg/dL — ABNORMAL HIGH (ref 65–99)
POTASSIUM: 4 mmol/L (ref 3.5–5.1)
SODIUM: 137 mmol/L (ref 135–145)
Total Bilirubin: 8.7 mg/dL — ABNORMAL HIGH (ref 0.3–1.2)
Total Protein: 5.2 g/dL — ABNORMAL LOW (ref 6.5–8.1)

## 2017-08-12 LAB — MAGNESIUM: Magnesium: 1.7 mg/dL (ref 1.7–2.4)

## 2017-08-12 MED ORDER — METHYLPREDNISOLONE SODIUM SUCC 40 MG IJ SOLR
40.0000 mg | Freq: Every day | INTRAMUSCULAR | Status: DC
  Administered 2017-08-12 – 2017-08-15 (×4): 40 mg via INTRAVENOUS
  Filled 2017-08-12 (×4): qty 1

## 2017-08-12 MED ORDER — OXYCODONE HCL 5 MG/5ML PO SOLN
5.0000 mg | ORAL | Status: DC | PRN
  Administered 2017-08-12 – 2017-08-13 (×6): 5 mg via ORAL
  Filled 2017-08-12 (×8): qty 5

## 2017-08-12 MED ORDER — MAGNESIUM SULFATE 2 GM/50ML IV SOLN
2.0000 g | Freq: Once | INTRAVENOUS | Status: AC
Start: 2017-08-12 — End: 2017-08-12
  Administered 2017-08-12: 2 g via INTRAVENOUS
  Filled 2017-08-12: qty 50

## 2017-08-12 MED ORDER — PANTOPRAZOLE SODIUM 40 MG PO TBEC
40.0000 mg | DELAYED_RELEASE_TABLET | Freq: Every day | ORAL | Status: DC
  Administered 2017-08-13 – 2017-08-19 (×7): 40 mg via ORAL
  Filled 2017-08-12 (×8): qty 1

## 2017-08-12 MED ORDER — NYSTATIN 100000 UNIT/ML MT SUSP
5.0000 mL | Freq: Four times a day (QID) | OROMUCOSAL | Status: DC
  Administered 2017-08-12 – 2017-08-20 (×26): 500000 [IU] via ORAL
  Filled 2017-08-12 (×30): qty 5

## 2017-08-12 NOTE — Evaluation (Signed)
Physical Therapy Evaluation Patient Details Name: Alexander Farley MRN: [id number] DOB: May 25, 1933 Today's Date: 08/12/2017   History of Present Illness  83 year old male with history of coronary artery disease, anxiety, hypertension, paroxysmal atrial fibrillation on chronic anticoagulation presented with complaints of weakness, diarrhea, anorexia and 20 pound weight loss over the last 2 months.  He is found to have elevated LFTs along with biliary dilatation with question of pancreatic head lesion on CAT scan of the abdomen and left sided hydronephrosis this admission.   Clinical Impression  Patient presents with problems listed below.  Will benefit from acute PT to maximize functional mobility prior to discharge.  Patient with general weakness and decreased balance impacting mobility/safety.  Recommend HHPT at d/c for continued therapy.    Follow Up Recommendations Home health PT;Supervision/Assistance - 24 hour    Equipment Recommendations  Rolling walker with 5" wheels;Wheelchair (measurements PT);Wheelchair cushion (measurements PT);3in1 (PT)    Recommendations for Other Services       Precautions / Restrictions Precautions Precautions: Fall Precaution Comments: Very weak Restrictions Weight Bearing Restrictions: No      Mobility  Bed Mobility Overal bed mobility: Needs Assistance Bed Mobility: Supine to Sit;Sit to Supine     Supine to sit: Min guard Sit to supine: Min guard   General bed mobility comments: Min guard for safety.  Required increased time for transitions.  Transfers Overall transfer level: Needs assistance Equipment used: Rolling walker (2 wheeled) Transfers: Sit to/from Stand Sit to Stand: Min assist         General transfer comment: Verbal cues for hand placement.  Assist to rise to stance, and for balance initially.  Patient able to stand for 30-40 seconds x2 and step in place.  Difficulty raising RLE - Lt knee buckling.  Ambulation/Gait              General Gait Details: NT  Stairs            Wheelchair Mobility    Modified Rankin (Stroke Patients Only)       Balance Overall balance assessment: Needs assistance Sitting-balance support: No upper extremity supported;Feet supported Sitting balance-Leahy Scale: Fair     Standing balance support: Bilateral upper extremity supported Standing balance-Leahy Scale: Poor                               Pertinent Vitals/Pain Pain Assessment: No/denies pain    Home Living Family/patient expects to be discharged to:: Private residence Living Arrangements: Spouse/significant other Available Help at Discharge: Family;Available 24 hours/day Type of Home: House Home Access: Stairs to enter Entrance Stairs-Rails: None Entrance Stairs-Number of Steps: 1 Home Layout: One level Home Equipment: None      Prior Function Level of Independence: Independent         Comments: Over past several months, patient has required assist for mobility and ADL's     Hand Dominance   Dominant Hand: Right    Extremity/Trunk Assessment   Upper Extremity Assessment Upper Extremity Assessment: Generalized weakness    Lower Extremity Assessment Lower Extremity Assessment: Generalized weakness       Communication   Communication: Expressive difficulties(Very soft voice)  Cognition Arousal/Alertness: Awake/alert Behavior During Therapy: Anxious;Flat affect(Tearful at times) Overall Cognitive Status: No family/caregiver present to determine baseline cognitive functioning  General Comments: Disoriented to time and situation.        General Comments      Exercises     Assessment/Plan    PT Assessment Patient needs continued PT services  PT Problem List Decreased strength;Decreased activity tolerance;Decreased balance;Decreased mobility;Decreased cognition;Decreased knowledge of use of DME       PT Treatment  Interventions DME instruction;Gait training;Functional mobility training;Therapeutic activities;Therapeutic exercise;Balance training;Cognitive remediation;Patient/family education    PT Goals (Current goals can be found in the Care Plan section)  Acute Rehab PT Goals Patient Stated Goal: To be able to go home PT Goal Formulation: With patient Time For Goal Achievement: 08/19/17 Potential to Achieve Goals: Fair    Frequency Min 3X/week   Barriers to discharge        Co-evaluation               AM-PAC PT "6 Clicks" Daily Activity  Outcome Measure Difficulty turning over in bed (including adjusting bedclothes, sheets and blankets)?: None Difficulty moving from lying on back to sitting on the side of the bed? : A Little Difficulty sitting down on and standing up from a chair with arms (e.g., wheelchair, bedside commode, etc,.)?: Unable Help needed moving to and from a bed to chair (including a wheelchair)?: A Little Help needed walking in hospital room?: A Lot Help needed climbing 3-5 steps with a railing? : A Lot 6 Click Score: 15    End of Session Equipment Utilized During Treatment: Gait belt Activity Tolerance: Patient limited by fatigue Patient left: in bed;with call bell/phone within reach;with bed alarm set;with family/visitor present Nurse Communication: Mobility status PT Visit Diagnosis: Unsteadiness on feet (R26.81);Other abnormalities of gait and mobility (R26.89);Muscle weakness (generalized) (M62.81);Adult, failure to thrive (R62.7)    Time: 9811-9147 PT Time Calculation (min) (ACUTE ONLY): 17 min   Charges:   PT Evaluation $PT Eval Moderate Complexity: 1 Mod     PT G Codes:        Carita Pian. Sanjuana Kava, [facility] Pager [place] 08/12/2017, 7:23 PM
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
## 2017-08-12 NOTE — Progress Notes (Signed)
Subjective: This was seen and examined at bedside. He complains of left upper abdominal pain, reports it is the same pain he's had for the last 2-3 months, worsened after eating. He had a bowel movement today morning what was standing in color, denies any nausea, vomiting or fever overnight.  Objective: Vital signs in last 24 hours: Temp:  [97.6 F (36.4 C)-99.1 F (37.3 C)] 98.8 F (37.1 C) (12/15 0519) Pulse Rate:  [67-94] 67 (12/15 0519) Resp:  [12-24] 16 (12/15 0519) BP: (125-216)/(69-113) 139/70 (12/15 0519) SpO2:  [96 %-100 %] 100 % (12/15 0519) Weight:  [54.4 kg (120 lb)] 54.4 kg (120 lb) (12/14 0926) Weight change:  Last BM Date: 08/11/17  PE: Frail appearing, thinly built, has pallor as well as icterus GENERAL: Not in acute distress ABDOMEN: Soft, nondistended, mild tenderness in left upper quadrant, no rebound tenderness, rigidity or guarding, normoactive bowel sounds EXTREMITIES: No deformity  Lab Results: Results for orders placed or performed during the hospital encounter of 08/07/17 (from the past 48 hour(s))  CBC with Differential/Platelet     Status: Abnormal   Collection Time: 08/11/17  4:08 AM  Result Value Ref Range   WBC 3.0 (L) 4.0 - 10.5 K/uL   RBC 4.10 (L) 4.22 - 5.81 MIL/uL   Hemoglobin 12.1 (L) 13.0 - 17.0 g/dL   HCT 36.0 (L) 39.0 - 52.0 %   MCV 87.8 78.0 - 100.0 fL   MCH 29.5 26.0 - 34.0 pg   MCHC 33.6 30.0 - 36.0 g/dL   RDW 19.5 (H) 11.5 - 15.5 %   Platelets 178 150 - 400 K/uL   Neutrophils Relative % 72 %   Neutro Abs 2.2 1.7 - 7.7 K/uL   Lymphocytes Relative 21 %   Lymphs Abs 0.6 (L) 0.7 - 4.0 K/uL   Monocytes Relative 7 %   Monocytes Absolute 0.2 0.1 - 1.0 K/uL   Eosinophils Relative 0 %   Eosinophils Absolute 0.0 0.0 - 0.7 K/uL   Basophils Relative 0 %   Basophils Absolute 0.0 0.0 - 0.1 K/uL  Comprehensive metabolic panel     Status: Abnormal   Collection Time: 08/11/17  4:08 AM  Result Value Ref Range   Sodium 136 135 - 145 mmol/L   Potassium 4.2 3.5 - 5.1 mmol/L   Chloride 109 101 - 111 mmol/L   CO2 21 (L) 22 - 32 mmol/L   Glucose, Bld 152 (H) 65 - 99 mg/dL   BUN 13 6 - 20 mg/dL   Creatinine, Ser 0.60 (L) 0.61 - 1.24 mg/dL   Calcium 8.2 (L) 8.9 - 10.3 mg/dL   Total Protein 4.9 (L) 6.5 - 8.1 g/dL   Albumin 2.4 (L) 3.5 - 5.0 g/dL   AST 189 (H) 15 - 41 U/L   ALT 460 (H) 17 - 63 U/L   Alkaline Phosphatase 517 (H) 38 - 126 U/L   Total Bilirubin 8.1 (H) 0.3 - 1.2 mg/dL   GFR calc non Af Amer >60 >60 mL/min   GFR calc Af Amer >60 >60 mL/min    Comment: (NOTE) The eGFR has been calculated using the CKD EPI equation. This calculation has not been validated in all clinical situations. eGFR's persistently <60 mL/min signify possible Chronic Kidney Disease.    Anion gap 6 5 - 15  Magnesium     Status: None   Collection Time: 08/11/17  4:08 AM  Result Value Ref Range   Magnesium 1.7 1.7 - 2.4 mg/dL  Comprehensive metabolic panel  Status: Abnormal   Collection Time: 08/12/17  3:40 AM  Result Value Ref Range   Sodium 137 135 - 145 mmol/L   Potassium 4.0 3.5 - 5.1 mmol/L   Chloride 108 101 - 111 mmol/L   CO2 22 22 - 32 mmol/L   Glucose, Bld 106 (H) 65 - 99 mg/dL   BUN 17 6 - 20 mg/dL   Creatinine, Ser 0.74 0.61 - 1.24 mg/dL   Calcium 8.2 (L) 8.9 - 10.3 mg/dL   Total Protein 5.2 (L) 6.5 - 8.1 g/dL   Albumin 2.5 (L) 3.5 - 5.0 g/dL   AST 298 (H) 15 - 41 U/L   ALT 491 (H) 17 - 63 U/L   Alkaline Phosphatase 569 (H) 38 - 126 U/L   Total Bilirubin 8.7 (H) 0.3 - 1.2 mg/dL   GFR calc non Af Amer >60 >60 mL/min   GFR calc Af Amer >60 >60 mL/min    Comment: (NOTE) The eGFR has been calculated using the CKD EPI equation. This calculation has not been validated in all clinical situations. eGFR's persistently <60 mL/min signify possible Chronic Kidney Disease.    Anion gap 7 5 - 15  CBC with Differential/Platelet     Status: Abnormal   Collection Time: 08/12/17  3:40 AM  Result Value Ref Range   WBC 6.9 4.0 - 10.5  K/uL   RBC 4.14 (L) 4.22 - 5.81 MIL/uL   Hemoglobin 12.1 (L) 13.0 - 17.0 g/dL   HCT 36.6 (L) 39.0 - 52.0 %   MCV 88.4 78.0 - 100.0 fL   MCH 29.2 26.0 - 34.0 pg   MCHC 33.1 30.0 - 36.0 g/dL   RDW 19.4 (H) 11.5 - 15.5 %   Platelets 231 150 - 400 K/uL   Neutrophils Relative % 74 %   Neutro Abs 5.1 1.7 - 7.7 K/uL   Lymphocytes Relative 19 %   Lymphs Abs 1.3 0.7 - 4.0 K/uL   Monocytes Relative 7 %   Monocytes Absolute 0.5 0.1 - 1.0 K/uL   Eosinophils Relative 0 %   Eosinophils Absolute 0.0 0.0 - 0.7 K/uL   Basophils Relative 0 %   Basophils Absolute 0.0 0.0 - 0.1 K/uL  Magnesium     Status: None   Collection Time: 08/12/17  3:40 AM  Result Value Ref Range   Magnesium 1.7 1.7 - 2.4 mg/dL    Studies/Results: Dg Ercp Biliary & Pancreatic Ducts  Result Date: 08/11/2017 CLINICAL DATA:  Obstructive jaundice. EXAM: ERCP TECHNIQUE: Multiple spot images obtained with the fluoroscopic device and submitted for interpretation post-procedure. FLUOROSCOPY TIME:  Fluoroscopy Time:  8 minutes and 36 seconds COMPARISON:  CT 08/07/2017 FINDINGS: Multiple image were submitted for this procedure. The gallbladder was opacified with contrast. The extrahepatic biliary system was never clearly opacified. Nonmetallic biliary stent was placed in the common bile duct region at the end of the procedure. Wire was advanced into the pancreatic duct during the procedure. IMPRESSION: Opacification of the gallbladder but the biliary tree and common bile duct were not opacified with contrast. Placement of non metallic biliary stent. These images were submitted for radiologic interpretation only. Please see the procedural report for the amount of contrast and the fluoroscopy time utilized. Electronically Signed   By: Markus Daft M.D.   On: 08/11/2017 13:28   Dg C-arm 1-60 Min-no Report  Result Date: 08/10/2017 Fluoroscopy was utilized by the requesting physician.  No radiographic interpretation.    Medications: I have  reviewed the patient's current  medications.  Assessment: 1. Abnormal LFTs(total bili 8.7/ALP 569/AST 298/ALT 491), did not improve despite ERCP with sphincterotomy and plastic stent placement. 2. Distal esophagus shows stenosis, post dilatation with 15 mL balloon. 3. Pancreatic head mass(2.21.9 cm), [id number], highly suspicious for pancreatic cancer 4. Vasculitis region of celiac axis, SMA- irregular and beaded appearance, moderate atherosclerotic disease of the aorta 5. Left hydronephrosis, left ureteral stenting on 08/10/14  Plan: Repeat ERCP with metallic stent placement, likely on Monday. Will start patient on IV steroid for vasculitis(origin of celiac axis and SMA) as patient continues to have left upper quadrant abdominal pain worse with meals.  I had a lengthy discussion with the patient and his wife at bedside regarding pancreatic head mass.As it appears to generate mass effect on distal splenic vein and portal splenic confluence, given his age and imaging findings, highly unlikely that the patient will be a surgical candidate. They understand that metallic stent placement will be attempted for palliative purposes.   Ronnette Juniper 08/12/2017, 9:02 AM   Pager [phone] If no answer or after 5 PM call [phone]

## 2017-08-13 LAB — COMPREHENSIVE METABOLIC PANEL
ALBUMIN: 2.2 g/dL — AB (ref 3.5–5.0)
ALK PHOS: 495 U/L — AB (ref 38–126)
ALT: 384 U/L — AB (ref 17–63)
AST: 224 U/L — ABNORMAL HIGH (ref 15–41)
Anion gap: 5 (ref 5–15)
BUN: 21 mg/dL — ABNORMAL HIGH (ref 6–20)
CHLORIDE: 109 mmol/L (ref 101–111)
CO2: 25 mmol/L (ref 22–32)
CREATININE: 0.61 mg/dL (ref 0.61–1.24)
Calcium: 7.9 mg/dL — ABNORMAL LOW (ref 8.9–10.3)
GFR calc non Af Amer: >60 mL/min (ref >60)
GLUCOSE: 110 mg/dL — AB (ref 65–99)
Potassium: 4 mmol/L (ref 3.5–5.1)
SODIUM: 139 mmol/L (ref 135–145)
Total Bilirubin: 8.2 mg/dL — ABNORMAL HIGH (ref 0.3–1.2)
Total Protein: 4.8 g/dL — ABNORMAL LOW (ref 6.5–8.1)

## 2017-08-13 MED ORDER — SULFAMETHOXAZOLE-TRIMETHOPRIM 800-160 MG PO TABS
1.0000 | ORAL_TABLET | ORAL | Status: DC
  Administered 2017-08-14 – 2017-08-18 (×3): 1 via ORAL
  Filled 2017-08-13 (×4): qty 1

## 2017-08-13 NOTE — Progress Notes (Signed)
The patient has no specific complaints today. I spoke to him and his wife. They are awaiting repeat ERCP with metallic biliary stent placement hopefully tomorrow.

## 2017-08-13 NOTE — Progress Notes (Signed)
Resumed care of pt. Agree with previous RN's assessment. Will continue with plan of care.  
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
## 2017-08-14 ENCOUNTER — Inpatient Hospital Stay (HOSPITAL_COMMUNITY): Payer: Medicare Other | Admitting: Certified Registered"

## 2017-08-14 ENCOUNTER — Encounter (HOSPITAL_COMMUNITY)
Admission: EM | Disposition: A | Discharge status: Hospice | Payer: Self-pay | Source: Home / Self Care | Attending: Internal Medicine

## 2017-08-14 ENCOUNTER — Inpatient Hospital Stay (HOSPITAL_COMMUNITY): Payer: Medicare Other

## 2017-08-14 ENCOUNTER — Encounter (HOSPITAL_COMMUNITY): Payer: Self-pay | Admitting: Gastroenterology

## 2017-08-14 HISTORY — PX: ERCP: SHX5425

## 2017-08-14 LAB — COMPREHENSIVE METABOLIC PANEL
ALK PHOS: 577 U/L — AB (ref 38–126)
ALT: 443 U/L — AB (ref 17–63)
AST: 346 U/L — AB (ref 15–41)
Albumin: 2.1 g/dL — ABNORMAL LOW (ref 3.5–5.0)
Anion gap: 4 — ABNORMAL LOW (ref 5–15)
BUN: 26 mg/dL — AB (ref 6–20)
CALCIUM: 8.1 mg/dL — AB (ref 8.9–10.3)
CO2: 25 mmol/L (ref 22–32)
CREATININE: 0.66 mg/dL (ref 0.61–1.24)
Chloride: 109 mmol/L (ref 101–111)
Glucose, Bld: 119 mg/dL — ABNORMAL HIGH (ref 65–99)
Potassium: 3.6 mmol/L (ref 3.5–5.1)
Sodium: 138 mmol/L (ref 135–145)
Total Bilirubin: 8.3 mg/dL — ABNORMAL HIGH (ref 0.3–1.2)
Total Protein: 4.4 g/dL — ABNORMAL LOW (ref 6.5–8.1)

## 2017-08-14 LAB — LIPASE, BLOOD: LIPASE: 62 U/L — AB (ref 11–51)

## 2017-08-14 LAB — MAGNESIUM: MAGNESIUM: 1.7 mg/dL (ref 1.7–2.4)

## 2017-08-14 SURGERY — ERCP, WITH INTERVENTION IF INDICATED
Anesthesia: General

## 2017-08-14 MED ORDER — ONDANSETRON HCL 4 MG/2ML IJ SOLN
INTRAMUSCULAR | Status: DC | PRN
  Administered 2017-08-14: 4 mg via INTRAVENOUS

## 2017-08-14 MED ORDER — INDOMETHACIN 50 MG RE SUPP
100.0000 mg | Freq: Once | RECTAL | Status: AC
  Administered 2017-08-14: 100 mg via RECTAL

## 2017-08-14 MED ORDER — GLUCAGON HCL RDNA (DIAGNOSTIC) 1 MG IJ SOLR
INTRAMUSCULAR | Status: AC
  Filled 2017-08-14: qty 1

## 2017-08-14 MED ORDER — OXYCODONE HCL 5 MG/5ML PO SOLN
5.0000 mg | ORAL | Status: DC | PRN
  Administered 2017-08-14 – 2017-08-15 (×6): 5 mg via ORAL
  Filled 2017-08-14 (×7): qty 5

## 2017-08-14 MED ORDER — FENTANYL CITRATE (PF) 100 MCG/2ML IJ SOLN
INTRAMUSCULAR | Status: AC
  Filled 2017-08-14: qty 2

## 2017-08-14 MED ORDER — CIPROFLOXACIN IN D5W 400 MG/200ML IV SOLN
400.0000 mg | Freq: Once | INTRAVENOUS | Status: AC
  Administered 2017-08-14: 400 mg via INTRAVENOUS

## 2017-08-14 MED ORDER — PROPOFOL 10 MG/ML IV BOLUS
INTRAVENOUS | Status: AC
  Filled 2017-08-14: qty 40

## 2017-08-14 MED ORDER — EPHEDRINE SULFATE-NACL 50-0.9 MG/10ML-% IV SOSY
PREFILLED_SYRINGE | INTRAVENOUS | Status: DC | PRN
  Administered 2017-08-14 (×2): 10 mg via INTRAVENOUS

## 2017-08-14 MED ORDER — PROPOFOL 10 MG/ML IV BOLUS
INTRAVENOUS | Status: DC | PRN
  Administered 2017-08-14: 120 mg via INTRAVENOUS

## 2017-08-14 MED ORDER — FENTANYL CITRATE (PF) 100 MCG/2ML IJ SOLN
INTRAMUSCULAR | Status: DC | PRN
  Administered 2017-08-14 (×2): 50 ug via INTRAVENOUS

## 2017-08-14 MED ORDER — LIDOCAINE 2% (20 MG/ML) 5 ML SYRINGE
INTRAMUSCULAR | Status: DC | PRN
  Administered 2017-08-14: 80 mg via INTRAVENOUS

## 2017-08-14 MED ORDER — PHENYLEPHRINE 40 MCG/ML (10ML) SYRINGE FOR IV PUSH (FOR BLOOD PRESSURE SUPPORT)
PREFILLED_SYRINGE | INTRAVENOUS | Status: DC | PRN
  Administered 2017-08-14 (×5): 80 ug via INTRAVENOUS

## 2017-08-14 MED ORDER — ROCURONIUM BROMIDE 10 MG/ML (PF) SYRINGE
PREFILLED_SYRINGE | INTRAVENOUS | Status: DC | PRN
  Administered 2017-08-14: 40 mg via INTRAVENOUS

## 2017-08-14 MED ORDER — CIPROFLOXACIN IN D5W 400 MG/200ML IV SOLN
INTRAVENOUS | Status: AC
  Filled 2017-08-14: qty 200

## 2017-08-14 MED ORDER — INDOMETHACIN 50 MG RE SUPP
RECTAL | Status: AC
  Filled 2017-08-14: qty 2

## 2017-08-14 MED ORDER — SODIUM CHLORIDE 0.9 % IV SOLN: INTRAVENOUS | Status: DC

## 2017-08-14 MED ORDER — LACTATED RINGERS IV SOLN
INTRAVENOUS | Status: DC
  Administered 2017-08-14: 12:00:00 via INTRAVENOUS

## 2017-08-14 MED ORDER — SUGAMMADEX SODIUM 200 MG/2ML IV SOLN
INTRAVENOUS | Status: DC | PRN
  Administered 2017-08-14: 125 mg via INTRAVENOUS

## 2017-08-14 NOTE — Anesthesia Preprocedure Evaluation (Signed)
Anesthesia Evaluation  Patient identified by MRN, date of birth, ID band Patient awake    Reviewed: Allergy & Precautions, NPO status , Patient's Chart, lab work & pertinent test results  Airway Mallampati: II  TM Distance: >3 FB     Dental   Pulmonary neg pulmonary ROS, former smoker,    breath sounds clear to auscultation       Cardiovascular hypertension, + angina + CAD and + Peripheral Vascular Disease  + dysrhythmias  Rhythm:Regular Rate:Normal     Neuro/Psych    GI/Hepatic negative GI ROS, Neg liver ROS,   Endo/Other  negative endocrine ROS  Renal/GU negative Renal ROS     Musculoskeletal   Abdominal   Peds  Hematology   Anesthesia Other Findings   Reproductive/Obstetrics                             Anesthesia Physical Anesthesia Plan  ASA: III  Anesthesia Plan: General   Post-op Pain Management:    Induction: Intravenous  PONV Risk Score and Plan: 2  Airway Management Planned: Oral ETT  Additional Equipment:   Intra-op Plan:   Post-operative Plan: Extubation in OR  Informed Consent: I have reviewed the patients History and Physical, chart, labs and discussed the procedure including the risks, benefits and alternatives for the proposed anesthesia with the patient or authorized representative who has indicated his/her understanding and acceptance.   Dental advisory given  Plan Discussed with: CRNA and Anesthesiologist  Anesthesia Plan Comments:         Anesthesia Quick Evaluation

## 2017-08-14 NOTE — Anesthesia Procedure Notes (Signed)
Procedure Name: Intubation Date/Time: 08/14/2017 1:00 PM Performed by: Safira Proffit D, CRNA Pre-anesthesia Checklist: Patient identified, Emergency Drugs available, Suction available and Patient being monitored Patient Re-evaluated:Patient Re-evaluated prior to induction Oxygen Delivery Method: Circle system utilized Preoxygenation: Pre-oxygenation with 100% oxygen Induction Type: IV induction Ventilation: Mask ventilation without difficulty Laryngoscope Size: Mac and 4 Grade View: Grade I Tube type: Oral Tube size: 7.5 mm Number of attempts: 1 Airway Equipment and Method: Stylet and Oral airway Placement Confirmation: ETT inserted through vocal cords under direct vision,  positive ETCO2 and breath sounds checked- equal and bilateral Secured at: 22 cm Tube secured with: Tape Dental Injury: Teeth and Oropharynx as per pre-operative assessment

## 2017-08-14 NOTE — Op Note (Signed)
[facility] Patient Name: Alexander Farley Procedure Date: 08/14/2017 MRN: [id number] Attending MD: Clarene Essex , MD Date of Birth: October 11, 1932 CSN: [id number] Age: 83 Admit Type: Inpatient Procedure:                ERCP Indications:              Biliary dilation on Computed Tomogram Scan probable                            pancreatic cancer Providers:                Clarene Essex, MD, Jobe Igo, RN, Elspeth Cho                            Tech., Technician, [facility], CRNA Referring MD:              Medicines:                General Anesthesia Complications:            No immediate complications. Estimated Blood Loss:     Estimated blood loss: none. Procedure:                Pre-Anesthesia Assessment:                           - Prior to the procedure, a History and Physical                            was performed, and patient medications and                            allergies were reviewed. The patient's tolerance of                            previous anesthesia was also reviewed. The risks                            and benefits of the procedure and the sedation                            options and risks were discussed with the patient.                            All questions were answered, and informed consent                            was obtained. Prior Anticoagulants: The patient has                            taken Lovenox (enoxaparin), last dose was 1 day                            prior to procedure. ASA Grade Assessment: III - A  patient with severe systemic disease. After                            reviewing the risks and benefits, the patient was                            deemed in satisfactory condition to undergo the                            procedure.                           After obtaining informed consent, the scope was                            passed under direct vision. Throughout the                procedure, the patient's blood pressure, pulse, and                            oxygen saturations were monitored continuously. The                            Duodenoscope was introduced through the mouth, and                            used to inject contrast into and used to locate the                            major papilla. The ERCP was somewhat difficult due                            to unusual anatomy. Successful completion of the                            procedure was aided by changing the patient to a                            prone position. We initially passed the scope                            without much difficulty on his left side and                            initially cannulated with him on his left side The                            patient tolerated the procedure well. Scope In: Scope Out: Findings:      One plastic stent originating in the biliary tree was emerging from the       major papilla virtually all was in the duodenum and it was snared and       sent for cytology. Using the triple lumen sphincterotome loaded with the  JAG Jagwire CBD cannulation was readily obtained however it did take       multiple maneuvers to advance the wire into the intrahepatics and he did       have either a cystic dilation of the CBD or gallbladder filling and we       then rolled him on his stomach and it was clear this was a cystic       dilation of the CBD The previously placed stent was visibly patent. A       biliary sphincterotomy had been performed during last ERCP and The       sphincterotomy appeared open. One stent was removed from the biliary       tree using a snare and sent for cytology as above. We then proceeded       with obtaining Cells for cytology were obtained by brushing. To discover       objects, the biliary tree was swept with a 9 mm balloon starting at the       middle third of the main bile duct. No cystic duct was filled with        injection of the mid to distal duct below the cystic dilated area and       after some deliberation at that point we elected to place One 10 Fr by 6       cm covered metal stent was placed 5.5 cm into the common bile duct. Bile       and sludge flowed through the stent. The stent was in good position.       There was no obvious pancreatic duct injection or wire advancement       throughout the procedure and on slow withdrawal of the scope there was       no obvious signs of bleeding and the scope was removed and the patient       tolerated the procedure well Impression:               - One visibly patent stent from the biliary tree                            was seen in the major papilla.                           - Prior biliary endoscopic sphincterotomy appeared                            open.                           - One stent was removed from the biliary tree and                            sent for cytology as above.                           - The biliary tree was swept. And brushed as above                           - One covered metal stent was placed into the  common bile duct. Moderate Sedation:      N/A- Per Anesthesia Care Recommendation:           - Clear liquid diet today. Slowly advance tomorrow                           - Continue present medications.                           - Await cytology results.                           - Return to GI clinic PRN.                           - Telephone GI clinic if symptomatic PRN. Recheck                            liver tests tomorrow Procedure Code(s):        --- Professional ---                           [phone], Esophagogastroduodenoscopy, flexible,                            transoral; with removal of foreign body(s) Diagnosis Code(s):        --- Professional ---                           Z96.89, Presence of other specified functional                            implants                            Z46.59, Encounter for fitting and adjustment of                            other gastrointestinal appliance and device                           K83.8, Other specified diseases of biliary tract CPT copyright 2016 [employer]. All rights reserved. The codes documented in this report are preliminary and upon coder review may  be revised to meet current compliance requirements. Clarene Essex, MD 08/14/2017 2:29:43 PM This report has been signed electronically. Number of Addenda: 0

## 2017-08-14 NOTE — Interval H&P Note (Signed)
History and Physical Interval Note:  08/14/2017 12:42 PM  Alexander Farley  has presented today for surgery, with the diagnosis of pancreatic mass  The various methods of treatment have been discussed with the patient and family. After consideration of risks, benefits and other options for treatment, the patient has consented to  Procedure(s) with comments: ENDOSCOPIC RETROGRADE CHOLANGIOPANCREATOGRAPHY (ERCP) (N/A) - To be scheduled as EUS with Dr.Rayland Hamed followed by ERCP with Dr.Magod UPPER ESOPHAGEAL ENDOSCOPIC ULTRASOUND (EUS) (N/A) as a surgical intervention .  The patient's history has been reviewed, patient examined, no change in status, stable for surgery.  I have reviewed the patient's chart and labs.  Questions were answered to the patient's satisfaction.    Assessment:  1.  Obstructive jaundice.  No significant improvement of jaundice with recent biliary plastic stent placement. 2.  Pancreatic head mass.  Plan:  1.  Endoscopic ultrasound with possible fine needle aspiration. 2.  Risks (bleeding, infection, bowel perforation that could require surgery, sedation-related changes in cardiopulmonary systems), benefits (identification and possible treatment of source of symptoms, exclusion of certain causes of symptoms), and alternatives (watchful waiting, radiographic imaging studies, empiric medical treatment) of upper endoscopy with ultrasound and possible fine needle aspiration biopsies (EUS +/- FNA) were explained to patient/family in detail and patient wishes to proceed. 3.  Endoscopic retrograde cholangiopancreatography with possible biliary stent removal and replacement. 4.  Risks (up to and including bleeding, infection, perforation, pancreatitis that can be complicated by infected necrosis and death), benefits (removal of stones, alleviating blockage, decreasing risk of cholangitis or choledocholithiasis-related pancreatitis), and alternatives (watchful waiting, percutaneous  transhepatic cholangiography) of ERCP were explained to patient/family in detail and patient elects to proceed.   Landry Dyke

## 2017-08-14 NOTE — Progress Notes (Signed)
Spoke with pt's wife concerning HH.  [facility] was selected for HHRN/PT/NA. Referral given to in house rep.

## 2017-08-14 NOTE — Anesthesia Postprocedure Evaluation (Signed)
Anesthesia Post Note  Patient: Alexander Farley  Procedure(s) Performed: ENDOSCOPIC RETROGRADE CHOLANGIOPANCREATOGRAPHY (ERCP) (N/A )     Patient location during evaluation: PACU Anesthesia Type: General Level of consciousness: awake Pain management: pain level controlled Vital Signs Assessment: post-procedure vital signs reviewed and stable Respiratory status: spontaneous breathing Cardiovascular status: stable Anesthetic complications: no    Last Vitals:  Vitals:   08/14/17 1445 08/14/17 1500  BP: 138/78 (!) 143/77  Pulse: (!) 40 81  Resp: (!) 25 (!) 25  Temp:    SpO2: 100% 97%    Last Pain:  Vitals:   08/14/17 1430  TempSrc: Oral  PainSc:                  [name]
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
## 2017-08-14 NOTE — Progress Notes (Signed)
PT Cancellation Note  Patient Details Name: Alexander Farley MRN: [id number] DOB: [birth year]   Cancelled Treatment:    Reason Eval/Treat Not Completed: Patient at procedure or test/unavailable(endo)   Velda Wendt,KATHrine E 08/14/2017, 12:44 PM Carmelia Bake, PT, DPT 08/14/2017 Pager: [phone]

## 2017-08-14 NOTE — Transfer of Care (Signed)
Immediate Anesthesia Transfer of Care Note  Patient: Alexander Farley  Procedure(s) Performed: ENDOSCOPIC RETROGRADE CHOLANGIOPANCREATOGRAPHY (ERCP) (N/A )  Patient Location: PACU  Anesthesia Type:General  Level of Consciousness: awake, alert  and oriented  Airway & Oxygen Therapy: Patient Spontanous Breathing and Patient connected to face mask oxygen  Post-op Assessment: Report given to RN and Post -op Vital signs reviewed and stable  Post vital signs: Reviewed and stable  Last Vitals:  Vitals:   08/14/17 1022 08/14/17 1220  BP:  135/76  Pulse: 85 80  Resp:  15  Temp:  37.3 C  SpO2:      Last Pain:  Vitals:   08/14/17 1220  TempSrc: Oral  PainSc:       Patients Stated Pain Goal: 4 (84/85/92 7639)  Complications: No apparent anesthesia complications

## 2017-08-15 DIAGNOSIS — R197 Diarrhea, unspecified: Secondary | ICD-10-CM

## 2017-08-15 LAB — CBC WITH DIFFERENTIAL/PLATELET
Basophils Absolute: 0 10*3/uL (ref 0.0–0.1)
Basophils Relative: 0 %
EOS PCT: 0 %
Eosinophils Absolute: 0 10*3/uL (ref 0.0–0.7)
HCT: 23.2 % — ABNORMAL LOW (ref 39.0–52.0)
HEMOGLOBIN: 8.1 g/dL — AB (ref 13.0–17.0)
LYMPHS ABS: 1.5 10*3/uL (ref 0.7–4.0)
LYMPHS PCT: 22 %
MCH: 30.1 pg (ref 26.0–34.0)
MCHC: 34.9 g/dL (ref 30.0–36.0)
MCV: 86.2 fL (ref 78.0–100.0)
MONOS PCT: 6 %
Monocytes Absolute: 0.4 10*3/uL (ref 0.1–1.0)
NEUTROS PCT: 72 %
Neutro Abs: 4.8 10*3/uL (ref 1.7–7.7)
Platelets: 220 10*3/uL (ref 150–400)
RBC: 2.69 MIL/uL — AB (ref 4.22–5.81)
RDW: 20.9 % — ABNORMAL HIGH (ref 11.5–15.5)
WBC: 6.6 10*3/uL (ref 4.0–10.5)

## 2017-08-15 LAB — COMPREHENSIVE METABOLIC PANEL
ALBUMIN: 2.2 g/dL — AB (ref 3.5–5.0)
ALK PHOS: 559 U/L — AB (ref 38–126)
ALT: 434 U/L — AB (ref 17–63)
AST: 264 U/L — ABNORMAL HIGH (ref 15–41)
Anion gap: 4 — ABNORMAL LOW (ref 5–15)
BUN: 29 mg/dL — ABNORMAL HIGH (ref 6–20)
CHLORIDE: 113 mmol/L — AB (ref 101–111)
CO2: 24 mmol/L (ref 22–32)
CREATININE: 0.83 mg/dL (ref 0.61–1.24)
Calcium: 8.2 mg/dL — ABNORMAL LOW (ref 8.9–10.3)
GFR calc non Af Amer: >60 mL/min (ref >60)
GLUCOSE: 122 mg/dL — AB (ref 65–99)
Potassium: 3.6 mmol/L (ref 3.5–5.1)
Sodium: 141 mmol/L (ref 135–145)
Total Bilirubin: 3.9 mg/dL — ABNORMAL HIGH (ref 0.3–1.2)
Total Protein: 4.4 g/dL — ABNORMAL LOW (ref 6.5–8.1)

## 2017-08-15 LAB — LIPASE, BLOOD: Lipase: 76 U/L — ABNORMAL HIGH (ref 11–51)

## 2017-08-15 MED ORDER — ENSURE ENLIVE PO LIQD
237.0000 mL | Freq: Three times a day (TID) | ORAL | Status: DC
  Administered 2017-08-15 – 2017-08-19 (×8): 237 mL via ORAL

## 2017-08-15 MED ORDER — PREDNISONE 20 MG PO TABS
20.0000 mg | ORAL_TABLET | Freq: Every day | ORAL | Status: DC
  Administered 2017-08-17 – 2017-08-20 (×4): 20 mg via ORAL
  Filled 2017-08-15 (×5): qty 1

## 2017-08-15 MED ORDER — METOPROLOL TARTRATE 25 MG PO TABS
12.5000 mg | ORAL_TABLET | Freq: Two times a day (BID) | ORAL | Status: DC
  Administered 2017-08-15 – 2017-08-19 (×9): 12.5 mg via ORAL
  Filled 2017-08-15 (×10): qty 1

## 2017-08-15 MED ORDER — MIRTAZAPINE 15 MG PO TBDP
15.0000 mg | ORAL_TABLET | Freq: Every day | ORAL | Status: DC
  Administered 2017-08-16 – 2017-08-19 (×5): 15 mg via ORAL
  Filled 2017-08-15 (×6): qty 1

## 2017-08-15 MED ORDER — POTASSIUM CHLORIDE 20 MEQ/15ML (10%) PO SOLN
40.0000 meq | Freq: Once | ORAL | Status: AC
  Administered 2017-08-15: 40 meq via ORAL
  Filled 2017-08-15: qty 30

## 2017-08-15 MED ORDER — ROSUVASTATIN CALCIUM 5 MG PO TABS
5.0000 mg | ORAL_TABLET | Freq: Every day | ORAL | Status: DC
  Administered 2017-08-15 – 2017-08-19 (×5): 5 mg via ORAL
  Filled 2017-08-15 (×5): qty 1

## 2017-08-15 NOTE — Progress Notes (Signed)
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   HISTORY OF PRESENTING ILLNESS:   Alexander Farley is a 83 y.o. 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   He presented to the ED on 08/07/2017 with increased weakness, anorexia, about 20 lbs weight loss and progressive fatigue. He also was having epigastric discomfort radiation to the back.  Labs showed alkaline phosphatase of 601 with lipase of 62, AST of 536, ALT of 1228, total bilirubin of 3.7.  The patient underwent CT abdomen pelvis with findings worrisome for interval development of biliary dilatation with increase in caliber of the pancreatic duct.  Underlying pancreatic head lesion was suspected.  Patient underwent MRCP which showed Apparent 2.5 x 1.9 cm subtle lesion in the head of the pancreas. The common duct abruptly terminates at the level of this lesion as does the nondilated pancreatic duct. There is mass-effect on the portal splenic confluence in the same region. No definite liver metastases although motion artifact could of obscure small liver lesions.  ERCP was done - Dilatation of the intrahepatic bile ducts and the proximal extrahepatic bile duct. A metallic stent was placed in the common bile duct. Narrowing along the proximal aspect of stent. Brushing showed atypical cells concerning for carcinoma.  CA 19-9 levels were [place] elevated at [address].  He underwent a cystoscopy with left stent insertion on 08/10/2017 for extrinsic compression of ureter.  He notes that he was gone from being independent and relatively active  to nearly bedbound in 2-3 months. Currently very fatigued and even having difficulty speaking loud enough.  Wife and 2 sons at bedside during discussion. Notes significant hicucps.  Severe anorexia with minimal po intake.   MEDICAL HISTORY:  Past Medical History:  Diagnosis Date  . Anxiety   . Back pain   . Carotid stenosis    Carotid US (3/15):  Bilateral ICA 1-39%  . Coronary artery disease    a.  hx stent to LAD;  b. LHC (2/11):  LAD with 70 and 80% ISR, D1 90%, CFX 30-40%, RCA 30-40% >>> CABG (L-LAD, S-OM)  . Dyslipidemia   . ED (erectile dysfunction)   . History of shingles    post herpetic neuralgia (L chest)  . Hx of cardiovascular stress test 02/2014   Normal study with no ischemia.  LVF was normal with EF 66%.  Alexander Farley Hx of echocardiogram     Echo (10/05):  EF 55-60%  . Hypertension   . Palpitations   . Paroxysmal atrial fibrillation (HCC)   . RBBB plus LA hemiblock     SURGICAL HISTORY: Past Surgical History:  Procedure Laterality Date  . APPLICATION OF CRANIAL NAVIGATION N/A 05/19/2016   Procedure: APPLICATION OF CRANIAL NAVIGATION;  Surgeon: Eustace Moore, MD;  Location: [facility];  Service: Neurosurgery;  Laterality: N/A;  . CARDIAC CATHETERIZATION  10/12/2009   NORMAL LEFT VENTRICULAR SYSTOLIC FUNCTION. EF 60-65%  . CORONARY ANGIOPLASTY WITH STENT PLACEMENT    . CORONARY ARTERY BYPASS GRAFT     FEb. 2011  . CRANIOTOMY N/A 05/19/2016   Procedure: CRANIOTOMY HEMATOMA EVACUATION SUBDURAL with brain lab;  Surgeon: Eustace Moore, MD;  Location: [facility]  [facility];  Service: Neurosurgery;  Laterality: N/A;  . CYSTOSCOPY W/ URETERAL STENT PLACEMENT Left 08/10/2017   Procedure: CYSTOSCOPY WITH RETROGRADE LEFT  STENT INSERTION;  Surgeon: Irine Seal, MD;  Location: [facility];  Service: Urology;  Laterality: Left;  . ERCP Left 08/11/2017   Procedure: ENDOSCOPIC RETROGRADE CHOLANGIOPANCREATOGRAPHY (ERCP);  Surgeon: Ronnette Juniper, MD;  Location: [facility];  Service:  Gastroenterology;  Laterality: Left;  . ERCP N/A 08/14/2017   Procedure: ENDOSCOPIC RETROGRADE CHOLANGIOPANCREATOGRAPHY (ERCP);  Surgeon: Clarene Essex, MD;  Location: [facility];  Service: Endoscopy;  Laterality: N/A;  To be scheduled as EUS with Dr.Outlaw followed by ERCP with Dr.Magod  . EYE SURGERY  02-07-11   Right Eye    SOCIAL HISTORY: Social History   Socioeconomic History  . Marital status: Married    Spouse name: Not on file  . Number of children: Not on file  . Years of education: Not on file  . Highest education level: Not on file  Social Needs  . Financial resource strain: Not on file  . Food insecurity - worry: Not on file  . Food insecurity - inability: Not on file  . Transportation needs - medical: Not on file  . Transportation needs - non-medical: Not on file  Occupational History  . Not on file  Tobacco Use  . Smoking status: Former Smoker    Last attempt to quit: 01/30/1985    Years since quitting: 32.5  . Smokeless tobacco: Never Used  Substance and Sexual Activity  . Alcohol use: No  . Drug use: No  . Sexual activity: No  Other Topics Concern  . Not on file  Social History Narrative  . Not on file    FAMILY HISTORY: Family History  Problem Relation Age of Onset  . Stroke Mother   . Stroke Father   . Heart attack Neg Hx     ALLERGIES:  is allergic to zocor [simvastatin].  MEDICATIONS:  Current Facility-Administered Medications  Medication Dose Route Frequency Provider Last Rate Last Dose  . acetaminophen (TYLENOL) tablet 650 mg  650 mg Oral Q4H PRN Ronnette Juniper, MD      . cyanocobalamin tablet 500 mcg  500 mcg Oral BID Ronnette Juniper, MD   500 mcg at 08/16/17 2149  . diazepam (VALIUM) tablet 10 mg  10 mg Oral Q6H PRN Ronnette Juniper, MD   10 mg at 08/16/17 0033  . feeding supplement (ENSURE ENLIVE) (ENSURE ENLIVE) liquid 237 mL  237 mL Oral TID BM Florencia Reasons, MD   237 mL at 08/16/17 2153  . folic acid (FOLVITE) tablet 1 mg  1 mg Oral Daily Ronnette Juniper,  MD   1 mg at 08/16/17 1228  . hydrALAZINE (APRESOLINE) injection 10 mg  10 mg Intravenous Q6H PRN Florencia Reasons, MD      . LORazepam (ATIVAN) tablet 0.5-1 mg  0.5-1 mg Oral BID PRN Ronnette Juniper, MD   1 mg at 08/14/17 2226  . metoprolol tartrate (LOPRESSOR) tablet 12.5 mg  12.5 mg Oral BID Florencia Reasons, MD   12.5 mg at 08/16/17 2152  . mirtazapine (REMERON SOL-TAB) disintegrating tablet 15 mg  15 mg Oral QHS Florencia Reasons, MD   15 mg at 08/16/17 2149  . misoprostol (CYTOTEC) tablet 100 mcg  100 mcg Oral QID Ronnette Juniper, MD   100 mcg at 08/16/17 2149  . morphine 4 MG/ML injection 2 mg  2 mg Intravenous Q2H PRN Micheline Rough, MD   2 mg at 08/17/17 0129  . multivitamin  with minerals tablet 1 tablet  1 tablet Oral Daily Florencia Reasons, MD   1 tablet at 08/16/17 1228  . nitroGLYCERIN (NITROSTAT) SL tablet 0.4 mg  0.4 mg Sublingual Q5 min PRN Ronnette Juniper, MD      . nystatin (MYCOSTATIN) 100000 UNIT/ML suspension 500,000 Units  5 mL Oral QID Florencia Reasons, MD   500,000 Units at 08/16/17 2148  . pantoprazole (PROTONIX) EC tablet 40 mg  40 mg Oral Daily Ronnette Juniper, MD   40 mg at 08/16/17 1228  . polyvinyl alcohol (LIQUIFILM TEARS) 1.4 % ophthalmic solution 1 drop  1 drop Both Eyes PRN Florencia Reasons, MD      . predniSONE (DELTASONE) tablet 20 mg  20 mg Oral Q breakfast Florencia Reasons, MD      . RESOURCE THICKENUP CLEAR   Oral PRN Patrecia Pour, MD      . rosuvastatin (CRESTOR) tablet 5 mg  5 mg Oral Daily Florencia Reasons, MD   5 mg at 08/16/17 1228  . sulfamethoxazole-trimethoprim (BACTRIM DS,SEPTRA DS) 800-160 MG per tablet 1 tablet  1 tablet Oral Q M,W,F Florencia Reasons, MD   1 tablet at 08/16/17 1228  . tamsulosin (FLOMAX) capsule 0.4 mg  0.4 mg Oral Daily Florencia Reasons, MD   0.4 mg at 08/16/17 1229    REVIEW OF SYSTEMS:    10 Point review of Systems was done is negative except as noted above.  PHYSICAL EXAMINATION: ECOG PERFORMANCE STATUS: 3-4  . Vitals:   08/16/17 2157 08/16/17 2235  BP: (!) 178/100 113/69  Pulse: 94   Resp: 18   Temp: 97.9 F  (36.6 C)   SpO2: 100%    Filed Weights   08/07/17 2035 08/11/17 0926 08/14/17 1220  Weight: 120 lb 12.8 oz (54.8 kg) 120 lb (54.4 kg) 120 lb (54.4 kg)   .Body mass index is 17.72 kg/m.  GENERAL cachetic appearing, very faint voice difficult to understand. Bed bound currently. SKIN: no acute rashes EYES:icteric sclera OROPHARYNX: dry mucus membranes NECK: supple, no JVD LYMPH:  no palpable lymphadenopathy in the cervical, axillary or inguinal regions LUNGS: clear to auscultation b/l with normal respiratory effort HEART: reg rr ABDOMEN:  Mild TTP epigastric and RUQ Extremity: no pedal edema PSYCH: alert & oriented x 3 with faint and soft speech NEURO: no focal motor/sensory deficits  LABORATORY DATA:  I have reviewed the data as listed  . CBC Latest Ref Rng & Units 08/16/2017 08/15/2017 08/12/2017  WBC 4.0 - 10.5 K/uL 8.1 6.6 6.9  Hemoglobin 13.0 - 17.0 g/dL 8.4(L) 8.1(L) 12.1(L)  Hematocrit 39.0 - 52.0 % 24.8(L) 23.2(L) 36.6(L)  Platelets 150 - 400 K/uL 261 220 231    . CMP Latest Ref Rng & Units 08/16/2017 08/15/2017 08/14/2017  Glucose 65 - 99 mg/dL 97 122(H) 119(H)  BUN 6 - 20 mg/dL 24(H) 29(H) 26(H)  Creatinine 0.61 - 1.24 mg/dL 0.75 0.83 0.66  Sodium 135 - 145 mmol/L 141 141 138  Potassium 3.5 - 5.1 mmol/L 3.6 3.6 3.6  Chloride 101 - 111 mmol/L 111 113(H) 109  CO2 22 - 32 mmol/L 27 24 25   Calcium 8.9 - 10.3 mg/dL 8.5(L) 8.2(L) 8.1(L)  Total Protein 6.5 - 8.1 g/dL 4.8(L) 4.4(L) 4.4(L)  Total Bilirubin 0.3 - 1.2 mg/dL 3.6(H) 3.9(H) 8.3(H)  Alkaline Phos 38 - 126 U/L 510(H) 559(H) 577(H)  AST 15 - 41 U/L 139(H) 264(H) 346(H)  ALT 17 - 63 U/L 352(H) 434(H) 443(H)    Component     Latest  Ref Rng & Units 08/07/2017  CA 19-9     0 - 35 U/mL 17,514 (H)    RADIOGRAPHIC STUDIES: I have personally reviewed the radiological images as listed and agreed with the findings in the report. Dg Chest 2 View  Result Date: 08/07/2017 CLINICAL DATA:  83 year old male with  diarrhea, lower extremity weakness for several days. EXAM: CHEST  2 VIEW COMPARISON:  07/06/2017 and earlier. FINDINGS: Semi upright AP and lateral views of the chest. Stable somewhat large lung volumes, upper lobe predominant emphysema better demonstrated by CT in 2017. The lungs are clear. No pneumothorax or pleural effusion. Prior CABG. Stable cardiac size and mediastinal contours. No acute osseous abnormality identified. Negative visible bowel gas pattern. No pneumoperitoneum. IMPRESSION: No acute cardiopulmonary abnormality. Emphysema (ICD10-J43.9). Electronically Signed   By: Genevie Ann M.D.   On: 08/07/2017 10:18   Ct Abdomen Pelvis W Contrast  Result Date: 08/07/2017 CLINICAL DATA:  Diarrhea and weakness.  Loss of weight. EXAM: CT ABDOMEN AND PELVIS WITH CONTRAST TECHNIQUE: Multidetector CT imaging of the abdomen and pelvis was performed using the standard protocol following bolus administration of intravenous contrast. CONTRAST:  195mL ISOVUE-300 IOPAMIDOL (ISOVUE-300) INJECTION 61% COMPARISON:  06/26/2017 FINDINGS: Lower chest: No acute abnormality. Hepatobiliary: There is diffuse intra scratch moderate to marked intrahepatic biliary dilatation. The common bile duct is increased in caliber measuring up to 2.1 cm. Stable cyst within segment 8 of the liver measuring 1.3 cm. Pancreas: There is mild increase caliber of the pancreatic duct measuring 4 mm. Suspicious area of relative hypoenhancement within the head of pancreas measuring 2.2 by 1.9 cm, image 24 of series 4. Cannot rule out primary pancreatic neoplasm. Spleen: Normal in size without focal abnormality. Adrenals/Urinary Tract: The adrenal glands are normal. There is new left-sided mild hydronephrosis. No obstructing stone or mass. Urinary bladder is unremarkable. Stomach/Bowel: The stomach appears normal. No dilated loops of small bowel identified. The appendix is visualized and appears normal. No pathologic dilatation of the colon.  Vascular/Lymphatic: Aortic atherosclerosis. No aneurysm. No adenopathy within the upper abdomen. No pelvic or inguinal adenopathy. Reproductive: Prostate gland appears enlarged. Other: No free fluid or fluid collections. No peritoneal nodularity or mass. Musculoskeletal: Scoliosis deformity is convex towards the right. There is multi level degenerative disc disease noted. IMPRESSION: 1. Interval development of biliary dilatation and increase caliber of the pancreatic duct. Underlying head of pancreas lesion is suspected. Recommend further evaluation with contrast enhanced MRCP. 2. No evidence for liver metastasis or upper abdominal adenopathy. 3. There is left-sided hydronephrosis. New from previous exam. Etiology indeterminate. No obstructing stone or mass identified. Urologic consultation is advised. 4.  Aortic Atherosclerosis (ICD10-I70.0). 5. Prostate gland enlargement. Electronically Signed   By: Kerby Moors M.D.   On: 08/07/2017 15:20   Mr 3d Recon At Scanner  Result Date: 08/07/2017 CLINICAL DATA:  Biliary dilatation with suspected pancreatic mass on CT. EXAM: MRI ABDOMEN WITHOUT AND WITH CONTRAST (INCLUDING MRCP) TECHNIQUE: Multiplanar multisequence MR imaging of the abdomen was performed both before and after the administration of intravenous contrast. Heavily T2-weighted images of the biliary and pancreatic ducts were obtained, and three-dimensional MRCP images were rendered by post processing. CONTRAST:  27mL MULTIHANCE GADOBENATE DIMEGLUMINE 529 MG/ML IV SOLN COMPARISON:  CT 08/07/2017.  CT scan 04/14/2016. FINDINGS: Lower chest:  Unremarkable. Hepatobiliary: Multiple hepatic cysts again noted seen on prior CT scans. There is marked intra and extrahepatic biliary duct dilatation. Common duct abruptly terminates at the level of the head of the pancreas.  Gallbladder nondistended. No stones evident within the gallbladder lumen. Pancreas: There is no substantial dilatation of the main pancreatic duct  although it does abruptly terminates in the head of the pancreas, in the same region as the extrahepatic biliary ducts. Although this study is markedly degraded by breathing motion, there appears to be a subtle mass in the head of the pancreas measuring about 2.5 x 1.9 cm (see images 22 and 23 of series 3. This same area is visible on postcontrast image 39 of series 1102. The lesion generates mass-effect on the distal splenic vein and portal splenic confluence. Spleen: No splenomegaly. No focal mass lesion. Adrenals/Urinary Tract: No adrenal nodule or mass. Multiple cysts are noted in the right kidney. There is mild left hydronephrosis with multiple cysts in the left kidney. Stomach/Bowel: No gross bowel dilatation. Vascular/Lymphatic: Portal vein is patent. Postcontrast imaging has motion artifact overlie the portal vein training linear area of signal void, but there is no evidence for portal vein thrombus on precontrast imaging today or on the CT scan was performed about 90 minutes ago. Other: No substantial intraperitoneal free fluid. Musculoskeletal: Focal area of enhancement identified posterior aspect of the L3 vertebral body (image 54 series 1104). IMPRESSION: 1. Markedly degraded study secondary to marked respiratory motion from patient inability to reproducibly breath hold. 2. Apparent 2.5 x 1.9 cm subtle lesion in the head of the pancreas. The common duct abruptly terminates at the level of this lesion as does the nondilated pancreatic duct. There is mass-effect on the portal splenic confluence in the same region. Evaluation of fat planes around the celiac axis and SMA cannot be reliably performed due to the extensive motion degradation. EUS recommended to further evaluate. 3. No definite liver metastases although motion artifact could of obscure small liver lesions. 4. Artifact overlying the portal vein on postcontrast imaging likely due to the extensive respiratory motion degradation of this exam. Portal  vein appears patent on precontrast imaging and on the CT scan performed less than 2 hours ago. 5. Focal enhancement in the L3 vertebral body, not definitively characterized. Attention on follow-up recommended as metastatic lesion not excluded. Electronically Signed   By: Misty Stanley M.D.   On: 08/07/2017 17:46   Dg Ercp Biliary & Pancreatic Ducts  Result Date: 08/14/2017 CLINICAL DATA:  Obstructive jaundice. EXAM: ERCP TECHNIQUE: Multiple spot images obtained with the fluoroscopic device and submitted for interpretation post-procedure. FLUOROSCOPY TIME:  Fluoroscopy Time:  10 minutes and 22 seconds Radiation Exposure Index (if provided by the fluoroscopic device): 181.21 mGy COMPARISON:  ERCP images [id number] 08/11/2017 FINDINGS: Cannulation and opacification of the biliary system. The proximal extrahepatic biliary system is markedly dilated. Dilated intrahepatic bile ducts. A metallic stent was placed in the common bile duct. Narrowing along the proximal aspect of stent. IMPRESSION: Dilatation of the intrahepatic bile ducts and the proximal extrahepatic bile duct. Placement of biliary stent. These images were submitted for radiologic interpretation only. Please see the procedural report for the amount of contrast and the fluoroscopy time utilized. Electronically Signed   By: Markus Daft M.D.   On: 08/14/2017 14:54   Dg Ercp Biliary & Pancreatic Ducts  Result Date: 08/11/2017 CLINICAL DATA:  Obstructive jaundice. EXAM: ERCP TECHNIQUE: Multiple spot images obtained with the fluoroscopic device and submitted for interpretation post-procedure. FLUOROSCOPY TIME:  Fluoroscopy Time:  8 minutes and 36 seconds COMPARISON:  CT 08/07/2017 FINDINGS: Multiple image were submitted for this procedure. The gallbladder was opacified with contrast. The extrahepatic biliary system was never  clearly opacified. Nonmetallic biliary stent was placed in the common bile duct region at the end of the procedure. Wire was advanced  into the pancreatic duct during the procedure. IMPRESSION: Opacification of the gallbladder but the biliary tree and common bile duct were not opacified with contrast. Placement of non metallic biliary stent. These images were submitted for radiologic interpretation only. Please see the procedural report for the amount of contrast and the fluoroscopy time utilized. Electronically Signed   By: Markus Daft M.D.   On: 08/11/2017 13:28   Dg C-arm 1-60 Min-no Report  Result Date: 08/10/2017 Fluoroscopy was utilized by the requesting physician.  No radiographic interpretation.   Mr Abdomen Mrcp W Wo Contast  Result Date: 08/07/2017 CLINICAL DATA:  Biliary dilatation with suspected pancreatic mass on CT. EXAM: MRI ABDOMEN WITHOUT AND WITH CONTRAST (INCLUDING MRCP) TECHNIQUE: Multiplanar multisequence MR imaging of the abdomen was performed both before and after the administration of intravenous contrast. Heavily T2-weighted images of the biliary and pancreatic ducts were obtained, and three-dimensional MRCP images were rendered by post processing. CONTRAST:  40mL MULTIHANCE GADOBENATE DIMEGLUMINE 529 MG/ML IV SOLN COMPARISON:  CT 08/07/2017.  CT scan 04/14/2016. FINDINGS: Lower chest:  Unremarkable. Hepatobiliary: Multiple hepatic cysts again noted seen on prior CT scans. There is marked intra and extrahepatic biliary duct dilatation. Common duct abruptly terminates at the level of the head of the pancreas. Gallbladder nondistended. No stones evident within the gallbladder lumen. Pancreas: There is no substantial dilatation of the main pancreatic duct although it does abruptly terminates in the head of the pancreas, in the same region as the extrahepatic biliary ducts. Although this study is markedly degraded by breathing motion, there appears to be a subtle mass in the head of the pancreas measuring about 2.5 x 1.9 cm (see images 22 and 23 of series 3. This same area is visible on postcontrast image 39 of series  1102. The lesion generates mass-effect on the distal splenic vein and portal splenic confluence. Spleen: No splenomegaly. No focal mass lesion. Adrenals/Urinary Tract: No adrenal nodule or mass. Multiple cysts are noted in the right kidney. There is mild left hydronephrosis with multiple cysts in the left kidney. Stomach/Bowel: No gross bowel dilatation. Vascular/Lymphatic: Portal vein is patent. Postcontrast imaging has motion artifact overlie the portal vein training linear area of signal void, but there is no evidence for portal vein thrombus on precontrast imaging today or on the CT scan was performed about 90 minutes ago. Other: No substantial intraperitoneal free fluid. Musculoskeletal: Focal area of enhancement identified posterior aspect of the L3 vertebral body (image 54 series 1104). IMPRESSION: 1. Markedly degraded study secondary to marked respiratory motion from patient inability to reproducibly breath hold. 2. Apparent 2.5 x 1.9 cm subtle lesion in the head of the pancreas. The common duct abruptly terminates at the level of this lesion as does the nondilated pancreatic duct. There is mass-effect on the portal splenic confluence in the same region. Evaluation of fat planes around the celiac axis and SMA cannot be reliably performed due to the extensive motion degradation. EUS recommended to further evaluate. 3. No definite liver metastases although motion artifact could of obscure small liver lesions. 4. Artifact overlying the portal vein on postcontrast imaging likely due to the extensive respiratory motion degradation of this exam. Portal vein appears patent on precontrast imaging and on the CT scan performed less than 2 hours ago. 5. Focal enhancement in the L3 vertebral body, not definitively characterized. Attention on follow-up recommended  as metastatic lesion not excluded. Electronically Signed   By: Misty Stanley M.D.   On: 08/07/2017 17:46    ASSESSMENT & PLAN:   83 yo with multiple  medical co-morbidities with poor ECOG PS of 3-4 with severe protein calorie malnutrition with   1) Obstructive Jaundice due to pancreatic mass 2) Pancreatic head mass with ECRP CBD brushing suspicious for carcinoma and significantly elevated Ca 19-9 and imaging findings are concerning for newly diagnosed pancreatico-biliary malignancy. No overt metastases reported on poor quality MRCP. 3) Severe protein calorie malnutrition. 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 -at this time they declined option of definitive EUS with biopsy of the pancreatic mass.  -patient and family also note that they would like to focus on keeping patient comfortable and are agreeable with meeting palliative care team to discuss consideration of institutional hospice at [facility] place. -given medical co-morbidities, his age and poor function status -- he is not an appropriate candidate at this time for consideration of surgery or other palliative treatment such as chemotherapy. -encourage po intake. -palliative care consultation. -I spend a significant amount of time defining patients goals of care. -will f/u as outpatient as needed  4) . Patient Active Problem List   Diagnosis Date Noted  . Acute liver failure without hepatic coma   . Palliative care encounter   . Abdominal pain   . Elevated liver enzymes 08/07/2017  . Pancreatic mass 08/07/2017  . 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 Dizziness 05/03/2017  . Malnutrition of moderate degree 05/31/2016  . Syncope 05/30/2016   . Chest pain 05/30/2016  . S/P craniotomy 05/19/2016  . Subdural hematoma ([place]) 05/15/2016  . HLD (hyperlipidemia) 04/23/2014  . Shingles 04/23/2014  . Unstable angina, sounds anginal, neg. nuc, may be GI component  03/27/2014  . Generalized weakness 11/16/2013  . Near syncope 11/16/2013  . PAF (paroxysmal atrial fibrillation) ([place]) 11/16/2013  . HTN (hypertension) 05/03/2011  . CAD (coronary artery disease), S/p stenting of his ostial LAD- 2006.  CABG 2011   02/02/2011  . Dizziness - light-headed 02/01/2011  . Arm numbness 02/01/2011     All of the patients questions were answered with apparent satisfaction. The patient knows to call the clinic with any problems, questions or concerns.  I spent 60 minutes counseling the patient face to face. The total time spent in the appointment was 80 minutes and more than 50% was on counseling and direct patient cares.    Sullivan Lone MD [place] AAHIVMS [facility] Hematology/Oncology Physician [facility]  (Office):       [phone] (Work cell):  [phone] (Fax):           [phone]

## 2017-08-15 NOTE — Progress Notes (Signed)
Physical Therapy Treatment Patient Details Name: Alexander Farley MRN: [id number] DOB: Jun 25, 1933 Today's Date: 08/15/2017    History of Present Illness 83 year old male with history of coronary artery disease, anxiety, hypertension, paroxysmal atrial fibrillation on chronic anticoagulation presented with complaints of weakness, diarrhea, anorexia and 20 pound weight loss over the last 2 months.  He is found to have elevated LFTs along with biliary dilatation with question of pancreatic head lesion on CAT scan of the abdomen and left sided hydronephrosis this admission.     PT Comments    Pt was willing to work despite fatigue from trip to [facility], very motivated to try another walk and review some LE exercises to increase flexibility and control of standign. He is with his wife who is supportive and is expecting to assist him at home.  Will follow him through on acute therapy for more strength and ROM as well as standing balance control with an AD.  Pt is very appropriate for HHPT and may even benefit from outpatient as well afterward.   Follow Up Recommendations  Home health PT;Supervision/Assistance - 24 hour     Equipment Recommendations  Rolling walker with 5" wheels;Wheelchair (measurements PT);Wheelchair cushion (measurements PT);3in1 (PT)    Recommendations for Other Services       Precautions / Restrictions Precautions Precautions: Fall Restrictions Weight Bearing Restrictions: No    Mobility  Bed Mobility               General bed mobility comments: sitting bedside when PT arrived  Transfers Overall transfer level: Needs assistance Equipment used: Rolling walker (2 wheeled);1 person hand held assist Transfers: Sit to/from Stand Sit to Stand: Min guard;Min assist         General transfer comment: pt was immediately using hands on walker and reminded to push off bed  Ambulation/Gait Ambulation/Gait assistance: Min guard;Min assist Ambulation Distance (Feet): 17  Feet Assistive device: Rolling walker (2 wheeled);1 person hand held assist Gait Pattern/deviations: Step-to pattern;Step-through pattern;Shuffle;Decreased stride length;Trunk flexed;Narrow base of support Gait velocity: reduced Gait velocity interpretation: Below normal speed for age/gender General Gait Details: needs assistance to maneuver walker to turn, weak   Stairs            Wheelchair Mobility    Modified Rankin (Stroke Patients Only)       Balance Overall balance assessment: Needs assistance Sitting-balance support: Feet supported Sitting balance-Leahy Scale: Good     Standing balance support: Bilateral upper extremity supported;During functional activity Standing balance-Leahy Scale: Poor Standing balance comment: requires assistance of wlaker for safe control of standing and extra time to extend the legs                            Cognition Arousal/Alertness: Awake/alert Behavior During Therapy: WFL for tasks assessed/performed Overall Cognitive Status: Within Functional Limits for tasks assessed                                        Exercises General Exercises - Lower Extremity Ankle Circles/Pumps: AROM;AAROM;Both;5 reps Quad Sets: AROM;Both;5 reps Heel Slides: AROM;Both;5 reps Hip ABduction/ADduction: AROM;AAROM;Both;10 reps    General Comments        Pertinent Vitals/Pain Pain Assessment: No/denies pain    Home Living  Prior Function            PT Goals (current goals can now be found in the care plan section) Acute Rehab PT Goals Patient Stated Goal: home Progress towards PT goals: Progressing toward goals    Frequency    Min 3X/week      PT Plan Current plan remains appropriate    Co-evaluation              AM-PAC PT "6 Clicks" Daily Activity  Outcome Measure  Difficulty turning over in bed (including adjusting bedclothes, sheets and blankets)?:  None Difficulty moving from lying on back to sitting on the side of the bed? : A Little Difficulty sitting down on and standing up from a chair with arms (e.g., wheelchair, bedside commode, etc,.)?: Unable Help needed moving to and from a bed to chair (including a wheelchair)?: A Little Help needed walking in hospital room?: A Little Help needed climbing 3-5 steps with a railing? : A Little 6 Click Score: 17    End of Session Equipment Utilized During Treatment: Gait belt Activity Tolerance: Patient limited by fatigue;Other (comment)(tired from just returning from [facility]) Patient left: in chair;with call bell/phone within reach;with chair alarm set;with family/visitor present Nurse Communication: Mobility status PT Visit Diagnosis: Unsteadiness on feet (R26.81);Other abnormalities of gait and mobility (R26.89);Muscle weakness (generalized) (M62.81);Adult, failure to thrive (R62.7)     Time: 1200-1223 PT Time Calculation (min) (ACUTE ONLY): 23 min  Charges:  $Gait Training: 8-22 mins $Therapeutic Exercise: 8-22 mins                    G Codes:  Functional Assessment Tool Used: AM-PAC 6 Clicks Basic Mobility     Ramond Dial 08/15/2017, 1:21 PM   Mee Hives, PT MS Acute Rehab Dept. Number: [place]

## 2017-08-15 NOTE — Progress Notes (Signed)
Alexander Farley 11:15 AM  Subjective: Patient without any obvious problems from his procedure which we discussed with him and his wife and case discussed with the hospital team as well and other than no appetite he has no other complaints except for very minimal left sided abdominal discomfort  Objective: Vital signs stable afebrile no acute distress abdomen is soft nontender liver tests decreased hemoglobin decreased probably from trauma from his dilation or some post sphincterotomy bleeding from his previous procedure since some clots were seen in the stomach and duodenum at the time of my procedure  Assessment: Probable pancreatic cancer  Plan: Await cytology continue to follow liver tests encouraged to eat possibly would benefit from an appetite stimulant  Emitt Maglione E  Pager [phone] After 5PM or if no answer call [phone]
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
## 2017-08-15 NOTE — Progress Notes (Signed)
Pt tearful this shift regarding recent diagnosis.

## 2017-08-15 NOTE — Progress Notes (Signed)
Nutrition Follow-up  DOCUMENTATION CODES:   Non-severe (moderate) malnutrition in context of acute illness/injury, Underweight  INTERVENTION:    Ensure Enlive po TID, each supplement provides 350 kcal and 20 grams of protein   Provide MVI daily  Recommend adding appetite stimulant if poor PO intake persist  NUTRITION DIAGNOSIS:   Moderate Malnutrition related to acute illness(new pancreatic mass) as evidenced by percent weight loss, energy intake < 75% for > 7 days, moderate fat depletion, moderate muscle depletion.  Ongoing  GOAL:   Patient will meet greater than or equal to 90% of their needs  Not meeting  MONITOR:   PO intake, Supplement acceptance, Labs, Weight trends, I & O's  REASON FOR ASSESSMENT:   Consult Assessment of nutrition requirement/status  ASSESSMENT:   84 year old male with history of coronary artery disease, anxiety, hypertension, paroxysmal atrial fibrillation on chronic anticoagulation presented with complaints of weakness, diarrhea, anorexia and 20 pound weight loss over the last 2 months.  He was found to have elevated LFTs along with biliary dilatation with question of pancreatic head lesion on CAT scan of the abdomen.  MRCP was ordered.   Pt's poor appetite continues. Wife states pt only consumes bites of jello and 1/4 of a Boost each day. Diet recently upgraded to Soft. Pt does not like cold food options. Will switch Boost to Ensure for more protein/calories. Recommend appetite stimulant if poor PO intake persist. May need to discuss other route of nutrition support depending on pt's wishes. Meal completions charted as 0% since beginning of admission. Weight noted to be stable at 120 lb since last RD visit.   Medications reviewed and include: folic acid, MVI with minerals Labs reviewed: BUN 29 (H) ALP 559 (H) AST 264 (H) ALT 434 (H)   Diet Order:  DIET SOFT Room service appropriate? Yes; Fluid consistency: Thin  EDUCATION NEEDS:   Education  needs have been addressed  Skin:  Skin Assessment: Reviewed RN Assessment  Last BM:  08/11/17  Height:   Ht Readings from Last 1 Encounters:  08/14/17 5\' 9"  (1.753 m)    Weight:   Wt Readings from Last 1 Encounters:  08/14/17 120 lb (54.4 kg)    Ideal Body Weight:  72.7 kg  BMI:  Body mass index is 17.72 kg/m.  Estimated Nutritional Needs:   Kcal:  1600-1800  Protein:  70-80g  Fluid:  1.8L/day    Mariana Single RD, LDN Clinical Nutrition Pager # [phone]

## 2017-08-15 NOTE — Plan of Care (Deleted)
Pt  calm and cooperative this shift. 

## 2017-08-15 NOTE — Progress Notes (Signed)
Spoke with pt's wife concerning another DME needs or HH needs. Wife asked for hospital bed. MD is aware of request for [facility] bed.

## 2017-08-15 NOTE — Care Management Important Message (Signed)
Important Message  Patient Details  Name: COLTER MAGOWAN MRN: [id number] Date of Birth: 1933/03/06   Medicare Important Message Given:  Yes    Kerin Salen 08/15/2017, 11:53 AMImportant Message  Patient Details  Name: WEBBER MICHIELS MRN: [id number] Date of Birth: [birth year]   Medicare Important Message Given:  Yes    Kerin Salen 08/15/2017, 11:53 AM

## 2017-08-16 ENCOUNTER — Encounter (HOSPITAL_COMMUNITY): Payer: Self-pay | Admitting: Gastroenterology

## 2017-08-16 DIAGNOSIS — K869 Disease of pancreas, unspecified: Secondary | ICD-10-CM

## 2017-08-16 DIAGNOSIS — E785 Hyperlipidemia, unspecified: Secondary | ICD-10-CM

## 2017-08-16 DIAGNOSIS — R531 Weakness: Secondary | ICD-10-CM

## 2017-08-16 DIAGNOSIS — K838 Other specified diseases of biliary tract: Secondary | ICD-10-CM

## 2017-08-16 DIAGNOSIS — R109 Unspecified abdominal pain: Secondary | ICD-10-CM

## 2017-08-16 DIAGNOSIS — Z515 Encounter for palliative care: Secondary | ICD-10-CM

## 2017-08-16 DIAGNOSIS — I776 Arteritis, unspecified: Secondary | ICD-10-CM

## 2017-08-16 DIAGNOSIS — K7200 Acute and subacute hepatic failure without coma: Secondary | ICD-10-CM

## 2017-08-16 LAB — COMPREHENSIVE METABOLIC PANEL
ALBUMIN: 2.5 g/dL — AB (ref 3.5–5.0)
ALK PHOS: 510 U/L — AB (ref 38–126)
ALT: 352 U/L — ABNORMAL HIGH (ref 17–63)
ANION GAP: 3 — AB (ref 5–15)
AST: 139 U/L — ABNORMAL HIGH (ref 15–41)
BUN: 24 mg/dL — ABNORMAL HIGH (ref 6–20)
CALCIUM: 8.5 mg/dL — AB (ref 8.9–10.3)
CO2: 27 mmol/L (ref 22–32)
Chloride: 111 mmol/L (ref 101–111)
Creatinine, Ser: 0.75 mg/dL (ref 0.61–1.24)
GFR calc non Af Amer: >60 mL/min (ref >60)
GLUCOSE: 97 mg/dL (ref 65–99)
POTASSIUM: 3.6 mmol/L (ref 3.5–5.1)
SODIUM: 141 mmol/L (ref 135–145)
TOTAL PROTEIN: 4.8 g/dL — AB (ref 6.5–8.1)
Total Bilirubin: 3.6 mg/dL — ABNORMAL HIGH (ref 0.3–1.2)

## 2017-08-16 LAB — TYPE AND SCREEN
ABO/RH(D): O POS
ANTIBODY SCREEN: NEGATIVE

## 2017-08-16 LAB — CBC WITH DIFFERENTIAL/PLATELET
BASOS ABS: 0 10*3/uL (ref 0.0–0.1)
BASOS PCT: 0 %
Eosinophils Absolute: 0 10*3/uL (ref 0.0–0.7)
Eosinophils Relative: 0 %
HEMATOCRIT: 24.8 % — AB (ref 39.0–52.0)
HEMOGLOBIN: 8.4 g/dL — AB (ref 13.0–17.0)
LYMPHS PCT: 24 %
Lymphs Abs: 2 10*3/uL (ref 0.7–4.0)
MCH: 29.7 pg (ref 26.0–34.0)
MCHC: 33.9 g/dL (ref 30.0–36.0)
MCV: 87.6 fL (ref 78.0–100.0)
Monocytes Absolute: 0.5 10*3/uL (ref 0.1–1.0)
Monocytes Relative: 7 %
NEUTROS ABS: 5.6 10*3/uL (ref 1.7–7.7)
NEUTROS PCT: 69 %
Platelets: 261 10*3/uL (ref 150–400)
RBC: 2.83 MIL/uL — ABNORMAL LOW (ref 4.22–5.81)
RDW: 21.3 % — ABNORMAL HIGH (ref 11.5–15.5)
WBC: 8.1 10*3/uL (ref 4.0–10.5)

## 2017-08-16 LAB — ABO/RH: ABO/RH(D): O POS

## 2017-08-16 MED ORDER — RESOURCE THICKENUP CLEAR PO POWD
ORAL | Status: DC | PRN
  Filled 2017-08-16: qty 125

## 2017-08-16 MED ORDER — MORPHINE SULFATE (PF) 4 MG/ML IV SOLN
4.0000 mg | INTRAVENOUS | Status: DC | PRN
  Administered 2017-08-16: 2 mg via INTRAVENOUS
  Administered 2017-08-16: 4 mg via INTRAVENOUS
  Filled 2017-08-16 (×2): qty 1

## 2017-08-16 MED ORDER — MORPHINE SULFATE (PF) 4 MG/ML IV SOLN
2.0000 mg | INTRAVENOUS | Status: DC | PRN
  Administered 2017-08-16 – 2017-08-18 (×15): 2 mg via INTRAVENOUS
  Filled 2017-08-16 (×16): qty 1

## 2017-08-16 NOTE — Evaluation (Signed)
Clinical/Bedside Swallow Evaluation Patient Details  Name: Alexander Farley MRN: [id number] Date of Birth: 1932/10/17  Today's Date: 08/16/2017 Time: SLP Start Time (ACUTE ONLY): 1149 SLP Stop Time (ACUTE ONLY): 1204 SLP Time Calculation (min) (ACUTE ONLY): 15 min  Past Medical History:  Past Medical History:  Diagnosis Date  . Anxiety   . Back pain   . Carotid stenosis    Carotid US (3/15):  Bilateral ICA 1-39%  . Coronary artery disease    a.  hx stent to LAD;  b. LHC (2/11):  LAD with 70 and 80% ISR, D1 90%, CFX 30-40%, RCA 30-40% >>> CABG (L-LAD, S-OM)  . Dyslipidemia   . ED (erectile dysfunction)   . History of shingles    post herpetic neuralgia (L chest)  . Hx of cardiovascular stress test 02/2014   Normal study with no ischemia.  LVF was normal with EF 66%.  Marland Kitchen Hx of echocardiogram     Echo (10/05):  EF 55-60%  . Hypertension   . Palpitations   . Paroxysmal atrial fibrillation (HCC)   . RBBB plus LA hemiblock    Past Surgical History:  Past Surgical History:  Procedure Laterality Date  . APPLICATION OF CRANIAL NAVIGATION N/A 05/19/2016   Procedure: APPLICATION OF CRANIAL NAVIGATION;  Surgeon: Eustace Moore, MD;  Location: [facility];  Service: Neurosurgery;  Laterality: N/A;  . CARDIAC CATHETERIZATION  10/12/2009   NORMAL LEFT VENTRICULAR SYSTOLIC FUNCTION. EF 60-65%  . CORONARY ANGIOPLASTY WITH STENT PLACEMENT    . CORONARY ARTERY BYPASS GRAFT     FEb. 2011  . CRANIOTOMY N/A 05/19/2016   Procedure: CRANIOTOMY HEMATOMA EVACUATION SUBDURAL with brain lab;  Surgeon: Eustace Moore, MD;  Location: [facility];  Service: Neurosurgery;  Laterality: N/A;  . CYSTOSCOPY W/ URETERAL STENT PLACEMENT Left 08/10/2017   Procedure: CYSTOSCOPY WITH RETROGRADE LEFT  STENT INSERTION;  Surgeon: Irine Seal, MD;  Location: [facility];  Service: Urology;  Laterality: Left;  . ERCP Left 08/11/2017   Procedure: ENDOSCOPIC RETROGRADE CHOLANGIOPANCREATOGRAPHY (ERCP);  Surgeon: Ronnette Juniper, MD;   Location: [facility];  Service: Gastroenterology;  Laterality: Left;  . EYE SURGERY  02-07-11   Right Eye   HPI:  Alexander Farley is an 83 year old male with a history of CAD, PAF, HTN, and anxiety who presented for 2 months of weight loss, fatigue and abdominal pain. Work up revealed biliary dilatation, elevated LFTs with pancreatic head mass and left-sided hydronephrosis. Ureteral stent was placed 12/13 and ERCP performed 12/14 with repeat ERCP 12/17 where brushings were taken with evidence of atypical cells suspicious for carcinoma on pathology. CA 19-9 is grossly elevated. The patient's functional status is poor and his per oral intake has been very little. Oncology has been consulted and, per report, have stated that his functional status would not allow for surgical intervention of chemotherapy. Palliative care has been consulted with concern that oral intake continues to be so poor, his prognosis would be days-weeks.  Swallow eval ordered due to observed new difficulties with meals.    Assessment / Plan / Recommendation Clinical Impression  Pt presents with changes in swallowing, marked primarily by coughing associated with thin liquids.  Pt quite weak, with prolonged mastication of solids, poor effort, limited intake.  Purees, nectar-thick liquids appeared to be tolerated; thin liquids elicited consistent weak cough.  Voice with low volume throughout assessment.  For now, recommend dysphagia 3 diet with nectar-thick liquids to minimize coughing/improve comfort and minimize potential aspiration with meals.  SLP will follow along to determine value of further testing and will await results of Palliative care's meeting with pt/family. D/W Mrs. Edenfield, who agrees with plan.  SLP Visit Diagnosis: Dysphagia, unspecified (R13.10)    Aspiration Risk       Diet Recommendation   dysphagia 3/nectar thick liquids  Medication Administration: Crushed with puree    Other  Recommendations Oral Care  Recommendations: Oral care BID Other Recommendations: Order thickener from pharmacy   Follow up Recommendations Other (comment)(tba)      Frequency and Duration min 2x/week  1 week       Prognosis Prognosis for Safe Diet Advancement: Fair      Swallow Study   General HPI: Alexander Farley is an 83 year old male with a history of CAD, PAF, HTN, and anxiety who presented for 2 months of weight loss, fatigue and abdominal pain. Work up revealed biliary dilatation, elevated LFTs with pancreatic head mass and left-sided hydronephrosis. Ureteral stent was placed 12/13 and ERCP performed 12/14 with repeat ERCP 12/17 where brushings were taken with evidence of atypical cells suspicious for carcinoma on pathology. CA 19-9 is grossly elevated. The patient's functional status is poor and his per oral intake has been very little. Oncology has been consulted and, per report, have stated that his functional status would not allow for surgical intervention of chemotherapy. Palliative care has been consulted with concern that is oral intake continues to be so poor, his prognosis would be days-weeks.  Swallow eval ordered due to observed difficulties with meals.  Type of Study: Bedside Swallow Evaluation Previous Swallow Assessment: no Diet Prior to this Study: Regular;Thin liquids Temperature Spikes Noted: No Respiratory Status: Room air History of Recent Intubation: No Behavior/Cognition: Alert Oral Cavity Assessment: Dried secretions Oral Care Completed by SLP: No Oral Cavity - Dentition: Adequate natural dentition Vision: Functional for self-feeding Self-Feeding Abilities: Able to feed self;Needs assist Patient Positioning: Upright in bed Baseline Vocal Quality: Low vocal intensity Volitional Cough: Weak Volitional Swallow: Able to elicit    Oral/Motor/Sensory Function Overall Oral Motor/Sensory Function: Within functional limits   Ice Chips Ice chips: Within functional limits   Thin Liquid Thin  Liquid: Impaired Presentation: Cup;Straw Pharyngeal  Phase Impairments: Cough - Immediate    Nectar Thick Nectar Thick Liquid: Within functional limits   Honey Thick Honey Thick Liquid: Not tested   Puree Puree: Within functional limits   Solid   GO   Solid: Impaired Presentation: Self Fed Oral Phase Functional Implications: Prolonged oral transit        Juan Quam Laurice 08/16/2017,12:37 PM  Estill Bamberg L. Tivis Ringer, [place] CCC/SLP Pager [phone]

## 2017-08-16 NOTE — Consult Note (Signed)
Palliative care consult received for diagnosis of likely pancreatic cancer diagnosis.  Meeting conducted with spouse Nadine Counts, son Vincente Liberty and family pastor.  Mr. Murata has had a complicated hospital course thus far.  He was admitted for increasing abdominal pain, diarrhea and poor appetite with significant weight loss over the past 2 months.  See full H&P for full details and medical history.  Primary discussion for today is to introduce palliative care and build trust with spouse and family.  Mrs. Robel verbalizes that she feels unclear about her husbands complete diagnosis.  She has had an understanding that there has been improvement with the current course of treatment to this point.  She and her husband received information last evening from Oncology that the diagnosis was "likely pancreatic cancer" and that there were limited, if any, other than symptom management that could be offered.  Dr. Bonner Puna graciously joined the meeting and managed the medical questions related to the diagnosis/prognosis.  Clearly Mrs. Shavers, her son and their pastor were upset by the news.  We did discuss briefly what the next steps may be with planning for care outside of the hospital.  Hospice services and scope were discussed.  At this time Mrs. Licciardi requested to continue with pain management and elected not to make any decisions regarding change in code status or hospice election at discharge.  The full discussion lasted about one hour.  Trust building is paramount at this point.  Dr. Domingo Cocking introduced himself to Mrs. Blancett at bedside.  Pain medication was adjusted per his order.  RN aware of goal at this point to manage pain.  Follow-up with Mrs. Alemany set for tomorrow before noon.    Kizzie Fantasia, MSN, RN-BC, [facility]
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
## 2017-08-16 NOTE — Progress Notes (Signed)
Patient had difficulty with swallowing thin liquids (water) this morning. He only was able to take very small amounts via straw and he began coughing after a few sips. He coughed to the point that he spit up the tablet that he had swallowed without any difficulties, other than very slow to swallow. MD was made aware of this and that it was reported by the night RN that the patient had an episode of coughing after thin liquids last night. MD to order SLP evaluation for swallowing function.   Alexander Farley [facility]

## 2017-08-16 NOTE — Progress Notes (Signed)
   08/16/17 1400  Clinical Encounter Type  Visited With Patient and family together  Visit Type Initial  Spiritual Encounters  Spiritual Needs Prayer   Rounding on patients on the Palliative list.  Patient was very quiet but said ok for me to come in.  Spouse was present and talked about how they rely heavily on their faith.  Nurse other medical care folks came in.  Will follow up.  Chaplain Katherene Ponto

## 2017-08-17 ENCOUNTER — Encounter (HOSPITAL_COMMUNITY): Payer: Self-pay | Admitting: Hematology

## 2017-08-17 DIAGNOSIS — Z7189 Other specified counseling: Secondary | ICD-10-CM

## 2017-08-17 DIAGNOSIS — C250 Malignant neoplasm of head of pancreas: Secondary | ICD-10-CM

## 2017-08-17 LAB — CBC
HEMATOCRIT: 25.7 % — AB (ref 39.0–52.0)
HEMOGLOBIN: 8.7 g/dL — AB (ref 13.0–17.0)
MCH: 30.3 pg (ref 26.0–34.0)
MCHC: 33.9 g/dL (ref 30.0–36.0)
MCV: 89.5 fL (ref 78.0–100.0)
Platelets: 290 10*3/uL (ref 150–400)
RBC: 2.87 MIL/uL — AB (ref 4.22–5.81)
RDW: 21.6 % — ABNORMAL HIGH (ref 11.5–15.5)
WBC: 7.5 10*3/uL (ref 4.0–10.5)

## 2017-08-17 LAB — COMPREHENSIVE METABOLIC PANEL
ALBUMIN: 2.3 g/dL — AB (ref 3.5–5.0)
ALT: 273 U/L — ABNORMAL HIGH (ref 17–63)
ANION GAP: 5 (ref 5–15)
AST: 87 U/L — ABNORMAL HIGH (ref 15–41)
Alkaline Phosphatase: 437 U/L — ABNORMAL HIGH (ref 38–126)
BILIRUBIN TOTAL: 3.1 mg/dL — AB (ref 0.3–1.2)
BUN: 18 mg/dL (ref 6–20)
CO2: 25 mmol/L (ref 22–32)
Calcium: 8.3 mg/dL — ABNORMAL LOW (ref 8.9–10.3)
Chloride: 108 mmol/L (ref 101–111)
Creatinine, Ser: 0.71 mg/dL (ref 0.61–1.24)
GFR calc Af Amer: >60 mL/min (ref >60)
GFR calc non Af Amer: >60 mL/min (ref >60)
GLUCOSE: 76 mg/dL (ref 65–99)
POTASSIUM: 3.5 mmol/L (ref 3.5–5.1)
SODIUM: 138 mmol/L (ref 135–145)
TOTAL PROTEIN: 4.7 g/dL — AB (ref 6.5–8.1)

## 2017-08-17 NOTE — Plan of Care (Signed)
  Pain Managment: General experience of comfort will improve 08/17/2017 0259 - Progressing by Ashley Murrain, RN
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
## 2017-08-17 NOTE — Progress Notes (Signed)
Physical Therapy Treatment Patient Details Name: Alexander Farley MRN: [id number] DOB: 1932-10-31 Today's Date: 08/17/2017    History of Present Illness 83 year old male with history of coronary artery disease, anxiety, hypertension, paroxysmal atrial fibrillation on chronic anticoagulation presented with complaints of weakness, diarrhea, anorexia and 20 pound weight loss over the last 2 months.  He is found to have elevated LFTs along with biliary dilatation with question of pancreatic head lesion on CAT scan of the abdomen and left sided hydronephrosis this admission.     PT Comments    Pt ambulated 62' with RW and min assist. Pt ambulates with shuffling gait and significantly flexed trunk and knees. He is significantly deconditioned. PT now recommending SNF, pt's wife stated they want to think about it. Performed seated LE exercises with assistance.    Follow Up Recommendations  Supervision/Assistance - 24 hour;SNF     Equipment Recommendations  Rolling walker with 5" wheels;Wheelchair (measurements PT);Wheelchair cushion (measurements PT);3in1 (PT)    Recommendations for Other Services       Precautions / Restrictions Precautions Precautions: Fall Precaution Comments: Very weak Restrictions Weight Bearing Restrictions: No    Mobility  Bed Mobility Overal bed mobility: Needs Assistance Bed Mobility: Rolling;Sidelying to Sit Rolling: Min assist Sidelying to sit: Min assist       General bed mobility comments: min A to initiate movement  Transfers Overall transfer level: Needs assistance Equipment used: Rolling walker (2 wheeled) Transfers: Sit to/from Stand Sit to Stand: Min assist         General transfer comment: VCs for hand placement  Ambulation/Gait Ambulation/Gait assistance: Min assist;Min guard Ambulation Distance (Feet): 18 Feet Assistive device: Rolling walker (2 wheeled) Gait Pattern/deviations: Shuffle;Decreased stride length;Trunk flexed;Narrow  base of support Gait velocity: reduced Gait velocity interpretation: Below normal speed for age/gender General Gait Details: knees and trunk significantly flexed despite heavy use of UEs on RW, flexion became more pronounced toward end of walk, pt unable to stand fully erect with verbal cues and min A to extend trunk, increased time, distance limited by fatigue   Stairs            Wheelchair Mobility    Modified Rankin (Stroke Patients Only)       Balance     Sitting balance-Leahy Scale: Fair     Standing balance support: Bilateral upper extremity supported Standing balance-Leahy Scale: Poor Standing balance comment: requires BUE support                            Cognition Arousal/Alertness: Awake/alert Behavior During Therapy: WFL for tasks assessed/performed Overall Cognitive Status: Within Functional Limits for tasks assessed                                        Exercises General Exercises - Lower Extremity Ankle Circles/Pumps: AROM;Both;15 reps;Seated Long Arc Quad: AAROM;Both;5 reps;Seated    General Comments        Pertinent Vitals/Pain Pain Assessment: 0-10 Pain Score: 8  Pain Location: abdomen and back Pain Descriptors / Indicators: Aching;Guarding;Grimacing Pain Intervention(s): Monitored during session;Limited activity within patient's tolerance;Premedicated before session    Home Living                      Prior Function            PT Goals (current goals can  now be found in the care plan section) Acute Rehab PT Goals Patient Stated Goal: home PT Goal Formulation: With patient Time For Goal Achievement: 08/19/17 Potential to Achieve Goals: Fair Progress towards PT goals: Progressing toward goals    Frequency    Min 3X/week      PT Plan Current plan remains appropriate    Co-evaluation              AM-PAC PT "6 Clicks" Daily Activity  Outcome Measure  Difficulty turning over in  bed (including adjusting bedclothes, sheets and blankets)?: A Little Difficulty moving from lying on back to sitting on the side of the bed? : Unable Difficulty sitting down on and standing up from a chair with arms (e.g., wheelchair, bedside commode, etc,.)?: Unable Help needed moving to and from a bed to chair (including a wheelchair)?: A Little Help needed walking in hospital room?: A Little Help needed climbing 3-5 steps with a railing? : Total 6 Click Score: 12    End of Session Equipment Utilized During Treatment: Gait belt Activity Tolerance: Patient limited by fatigue(tired from just returning from [facility]) Patient left: in chair;with call bell/phone within reach;with chair alarm set;with family/visitor present Nurse Communication: Mobility status PT Visit Diagnosis: Unsteadiness on feet (R26.81);Other abnormalities of gait and mobility (R26.89);Muscle weakness (generalized) (M62.81);Adult, failure to thrive (R62.7)     Time: 1210-1231 PT Time Calculation (min) (ACUTE ONLY): 21 min  Charges:  $Gait Training: 8-22 mins                    G Codes:          Philomena Doheny 08/17/2017, 12:49 PM [phone]

## 2017-08-17 NOTE — Progress Notes (Signed)
Alexander Farley 11:17 AM  Subjective: Patient without any new complaints little more alert and awake today and case again discussed with his wife and he's trying to eat a little which we discussed and his pain is under control  Objective: Vital signs stable afebrile no acute distress abdomen is soft seemingly decreased tenderness today LFTs continue to slowly decrease  Assessment: Almost certainly pancreatic cancer  Plan: Please let me know if I can be of any further assistance with this hospital stay and would be interesting when liver tests are close to normal to repeat the CA 19 which would be a more accurate indication when liver tests are normal  [facility] E  Pager [phone] After 5PM or if no answer call [phone]

## 2017-08-17 NOTE — Plan of Care (Deleted)
  Pain Managment: General experience of comfort will improve 08/17/2017 0319 - Progressing by Ashley Murrain, RN 08/17/2017 0259 - Progressing by Ashley Murrain, RN

## 2017-08-18 DIAGNOSIS — C250 Malignant neoplasm of head of pancreas: Principal | ICD-10-CM

## 2017-08-19 DIAGNOSIS — R7989 Other specified abnormal findings of blood chemistry: Secondary | ICD-10-CM

## 2017-08-19 DIAGNOSIS — R945 Abnormal results of liver function studies: Secondary | ICD-10-CM

## 2017-08-19 MED ORDER — FENTANYL CITRATE (PF) 100 MCG/2ML IJ SOLN
50.0000 ug | INTRAMUSCULAR | Status: AC | PRN
  Administered 2017-08-19 (×2): 50 ug via INTRAVENOUS
  Filled 2017-08-19 (×2): qty 2

## 2017-08-19 MED ORDER — FENTANYL BOLUS VIA INFUSION
25.0000 ug | INTRAVENOUS | Status: DC | PRN
  Administered 2017-08-19 – 2017-08-20 (×5): 25 ug via INTRAVENOUS
  Filled 2017-08-19: qty 25

## 2017-08-19 MED ORDER — FENTANYL CITRATE (PF) 100 MCG/2ML IJ SOLN
50.0000 ug | INTRAMUSCULAR | Status: DC | PRN
  Administered 2017-08-19 – 2017-08-20 (×3): 50 ug via INTRAVENOUS
  Filled 2017-08-19 (×4): qty 2

## 2017-08-19 MED ORDER — SODIUM CHLORIDE 0.9 % IV SOLN
25.0000 ug/h | INTRAVENOUS | Status: DC
  Administered 2017-08-19: 25 ug/h via INTRAVENOUS
  Filled 2017-08-19: qty 50

## 2017-08-19 NOTE — Progress Notes (Signed)
Patient crying out in pain, stating "the medicine y'all are giving me isn't doing anything" and also stating he "just wants to go home." PRN Morphine given at 2058 and 2331. On-call notified and PRN Fentanyl ordered- was given at [facility] and pt is now resting quietly in bed with his eyes closed. Son at bedside and bed alarm on.

## 2017-08-19 NOTE — Progress Notes (Signed)
[facility] Liaison:  RN visit  Received request from [place], for family interest in [facility].  Chart reviewed and spoke with wife to acknowledge referral yesterday.  Unfortunately, [facility] is not able to offer a room today.  Family and [place], LCSW are aware [facility] liaison will follow up with LCSW and family tomorrow or sooner if room becomes available.  I also spoke with Dr. Domingo Cocking earlier and advised of same.  Please do not hesitate to call with questions.   Thank you for this referral.   Edyth Gunnels, RN, [facility] Liaison [phone]  All hospital liaisons are now on [place].

## 2017-08-19 NOTE — Progress Notes (Signed)
Palliative Care Progress Note  Reason for consult: Goals of care, symptom management, disposition in light of likely pancreatic cancer  Chart reviewed and events overnight including uncontrolled pain noted.  Also note transition to fentanyl which seems to be effective for controlling pain.  I met with Mr. Reep, his wife, and 1 of his sons.  We discussed his clinical course over the last 24 hours with focus on his pain and he reports that he is still hurting at this time.  He has been receiving fentanyl 50 mcg every 2 hours since around 2 AM.  He has had 6 doses of 50 mcg in the last 12 hours.  We discussed options moving forward for pain control including continuation of current regimen which is 50 mcg fentanyl pushes every hour as needed, and initiation of low-dose continuous infusion at 25 mcg of fentanyl per hour, or starting a PCA pump.  He and his family do not think that he will be able to utilize PCA effectively.   We will therefore plan for initiation of continuous infusion of fentanyl 25 mcg/h (this is equivalent to the number of as needed doses he has gotten over the last 12 hours).  Also plan for additional fentanyl 25 mcg every 30 minutes as needed.  I was able to touch base with Amy Evans from hospice and it does not appear that bed will be available at [facility] today.  [facility] will continue to keep [place] updated when there is availability.  Gust with Dr. Starla Link, patient, his family, and bedside RN.  Total time: 30 minutes Greater than 50%  of this time was spent counseling and coordinating care related to the above assessment and plan.  Micheline Rough, MD [facility] [phone]

## 2017-08-19 NOTE — Progress Notes (Signed)
SLP Discharge Note  Patient Details Name: Alexander Farley MRN: [id number] DOB: December 11, 1932  Pt is awaiting hospice bed.  Our services will respectfully sign off.  Juan Quam Laurice 08/19/2017, 4:52 PM
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
## 2017-08-20 DIAGNOSIS — G893 Neoplasm related pain (acute) (chronic): Secondary | ICD-10-CM

## 2017-08-20 NOTE — Progress Notes (Signed)
190 mls of Fentanyl wasted in sink by this RN and Shelton Silvas, RN at patients discharge from unit.

## 2017-08-20 NOTE — Progress Notes (Signed)
Palliative Care Progress Note  Reason for consult: Goals of care, symptom management, disposition in light of likely pancreatic cancer  Chart reviewed and events overnight including better pain control pain noted.    I met with Alexander Farley, his wife, and 1 of his sons.    He reports feeling much better with current pain regimen and is very appreciative of care received.  He has been offered and accepted bed at [facility].  Until discharge, continue continuous infusion of fentanyl 25 mcg/h with additional fentanyl 25 mcg every 30 minutes as needed.  I was able to touch base with Edyth Gunnels from hospice and paperwork completed and no other needs at this time.  Discussed with patient, his family, and bedside RN.  Total time: 20 minutes Greater than 50%  of this time was spent counseling and coordinating care related to the above assessment and plan.  Micheline Rough, MD [place] Team [phone]

## 2017-08-20 NOTE — Progress Notes (Signed)
[place] [facility] Liaison:  RN  Received request from Oval Linsey, for family interest in [facility] with request for transfer as soon as possible.  Chart reviewed.  Met with patient and wife, Alexander Farley, to confirm interest and explain services.  All are agreeable to transfer today.  Eliezer Lofts, LCSW, aware.  Registration paperwork completed with [facility] today.  Dr. Orpah Melter to assume care per family request.  Please fax discharge summary to [phone].  RN, pleas call report to [phone].  Please arrange transport for patient to arrive before noon, if possible.    Thank you for this referral.    Edyth Gunnels, RN, [facility] Liaison [phone]   All hospital liaisons are now on [place].

## 2017-08-20 NOTE — Progress Notes (Signed)
190 mls of Fentanyl wasted in sink by this RN and Othella Boyer, RN at patients discharge from unit.

## 2017-08-20 NOTE — Progress Notes (Signed)
Fentanyl 50 mcg given IV immediately upon EMS arriving to unit to get patient for transport to [facility].  IV discontinued.  Report called to Helene Kelp at [facility].  Family at bedside.
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
## 2017-08-20 NOTE — Progress Notes (Signed)
Patient will discharge to [facility] Anticipated discharge date: 12/23 Family notified: at bedside Transportation by [facility]- called at 10:30am  CSW signing off.  Jorge Ny MSW, LCSW [facility] #: [phone]

## 2017-08-25 ENCOUNTER — Ambulatory Visit: Payer: Medicare Other | Admitting: Cardiology

## 2017-08-28 ENCOUNTER — Encounter: Payer: Self-pay | Admitting: Cardiology

## 2017-08-29 NOTE — Progress Notes (Signed)
PT Cancellation Note  Patient Details Name: RAYMEL CULL MRN: [id number] DOB: [birth year]   Cancelled Treatment:    Reason Eval/Treat Not Completed: Fatigue/lethargy limiting ability to participate(pt stated he fatigued from being up in the chair earlier today and would like to rest right now. Will follow. )   Philomena Doheny 09-06-2017, 11:44 AM [phone]

## 2017-08-29 NOTE — Progress Notes (Signed)
[facility] Liaison:  RN visit  Received request from Oval Linsey, for family interest in [facility].  Chart reviewed and spoke with wife to acknowledge referral.  Unfortunately, [facility] is not able to offer a room today.  Family and LCSW are aware [facility] liaison will follow up with LCSW and family tomorrow or sooner if room becomes available.  Please do not hesitate to call with questions.   Thank you for this referral.   Edyth Gunnels, RN, [facility] Liaison [phone]  All hospital liaisons are now on [place].

## 2017-08-29 NOTE — Final Consult Note (Signed)
Consultant Final Sign-Off Note    Mr. Gillespie will need follow up in my office in 3 months to discuss stent replacement if his medical condition allows and based on recent notes and the request for palliative care it appears that is not likely to be needed.   I have left f/u instructions should his condition improve sufficiently to need the procedure.

## 2017-08-29 NOTE — Progress Notes (Signed)
Discussed with patients wife,  she agrees with DNR, will change the code status to DNR.

## 2017-08-29 NOTE — Progress Notes (Signed)
Palliative Care Progress Note  Reason for consult: Goals of care, symptom management, disposition in light of likely pancreatic cancer  Plan to check in on Mr. Dann this morning.  He was sleeping and did not arouse with gentle verbal or tactile stimulation.  His wife was sitting in the hall and we discussed care plan moving forward.  She reports understanding that his condition is not going to improve and wants to be sure that he feels as well as he can is able to enjoy time with family.  She also reports understanding that residential hospice, such as [facility], would likely be the best fit for their family moving forward as they are not able to care for him at their own home.  She has been discussing this with other members of her family and they are in agreement that family would be the best people to share care plan and bring up residential hospice with the patient.  She reports that there is some feeling that [facility] is "all about dying" but we discussed how it can be a more homelike environment where they can focus on aggressive symptom management to ensure that he is able to enjoy whatever time he has left with his family.  His wife reports that she would like to have the opportunity to discuss further with her family. She is hopeful that Kizzie Fantasia from our team will be able to follow-up with her later today in order to continue conversation as she has build a therapeutic relationship with her.  Total time: 30 minutes Greater than 50%  of this time was spent counseling and coordinating care related to the above assessment and plan.  Micheline Rough, MD [facility] [phone]

## 2017-08-29 NOTE — Progress Notes (Signed)
Oncology Short Note  I  met with the patient and his family at bedside in the evening on 08/17/2017 to follow-up on her previous discussion.  He still appears quite fatigued.  We discussed that his LFTs have improved after stenting and continue to improve.  Still has poor appetite.  Abdominal pain somewhat better controlled but still an issue. No changes in goals of care at this time. Appreciate input by hospitalist, palliative care, nursing staff and GI. Patient's overall poor performance status and he and his family remains understandably disinterested in additional aggressive diagnostic workup or palliative therapies. This is reasonable. Clinically from an oncologic standpoint consideration of hospice would be fairly appropriate at this time. In the future if the patient's clinical status significantly improves and he is able to function better and is maintaining nutritional input and is so inclined, he could contact us for follow-up in oncology clinic to consider more definitive diagnostic workup and consideration of palliative therapies.  Sullivan Lone MD MS

## 2017-08-29 NOTE — Progress Notes (Signed)
   09/03/2017 1500  Clinical Encounter Type  Visited With Patient and family together  Visit Type Follow-up  Spiritual Encounters  Spiritual Needs Prayer   Rounding on Palliative patients.  Met this patient and his spouse on Wednesday.  Sat at bedside and spoke to patient offering words of comfort and peace.  Prayed with he and his wife.  Will follow as needed. Chaplain Katherene Ponto
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
## 2017-08-29 NOTE — Clinical Social Work Note (Signed)
Clinical Social Work Assessment  Patient Details  Name: Alexander Farley MRN: [id number] Date of Birth: 09/22/1932  Date of referral:  08-23-2017               Reason for consult:  End of Life/Hospice                Permission sought to share information with:  Facility Art therapist granted to share information::     Name::        Agency::     Relationship::     Contact Information:     Housing/Transportation Living arrangements for the past 2 months:  Single Family Home Source of Information:  Spouse Patient Interpreter Needed:  None Criminal Activity/Legal Involvement Pertinent to Current Situation/Hospitalization:  No - Comment as needed Significant Relationships:  Spouse Lives with:  Spouse Do you feel safe going back to the place where you live?    Need for family participation in patient care:  Yes (Comment)  Care giving concerns:  Patient from home. CSW consulted for residential hospice.    Social Worker assessment / plan:  CSW spoke with patient's wife regarding consult for residential hospice. Patient's wife reported that she is interested in residential hospice and she prefers [facility]. CSW agreed to make referral and follow up with patient's wife.  CSW made referral to [facility] liaison. CSW will continue to follow and assist with discharge planning.   Employment status:  Retired Nurse, adult PT Recommendations:  [place], [place] / Referral to community resources:  Other (Comment Required)([facility] ([place]  Patient/Family's Response to care:  Patient's wife agreeable to residential hospice, specifically [facility].   Patient/Family's Understanding of and Emotional Response to Diagnosis, Current Treatment, and Prognosis:  Patient asleep, CSW spoke with patient's wife. Patient's wife presented with minimal speech and verbalized plan for patient to  discharge to residential hospice. CSW provided emotional support and agreed to follow up with patient's wife after meeting with [facility].   Emotional Assessment Appearance:    Attitude/Demeanor/Rapport:  Unable to Assess Affect (typically observed):  Unable to Assess Orientation:  Oriented to Self Alcohol / Substance use:  Not Applicable Psych involvement (Current and /or in the community):  No (Comment)  Discharge Needs  Concerns to be addressed:  Care Coordination Readmission within the last 30 days:  No Current discharge risk:  Terminally ill Barriers to Discharge:  Hospice Bed not available   Burnis Medin, LCSW 08-23-2017, 3:16 PM

## 2017-08-29 DEATH — deceased

## 2017-10-25 ENCOUNTER — Other Ambulatory Visit: Payer: Medicare Other
